# Patient Record
Sex: Female | Born: 1950
Health system: Southern US, Community
[De-identification: ages and names within clinical notes are randomized; demographics above are authoritative.]

## PROBLEM LIST (undated history)

## (undated) DIAGNOSIS — E611 Iron deficiency: Secondary | ICD-10-CM

## (undated) DIAGNOSIS — H33313 Horseshoe tear of retina without detachment, bilateral: Secondary | ICD-10-CM

## (undated) DIAGNOSIS — I499 Cardiac arrhythmia, unspecified: Secondary | ICD-10-CM

## (undated) DIAGNOSIS — J8410 Pulmonary fibrosis, unspecified: Secondary | ICD-10-CM

## (undated) DIAGNOSIS — H269 Unspecified cataract: Secondary | ICD-10-CM

## (undated) DIAGNOSIS — E785 Hyperlipidemia, unspecified: Secondary | ICD-10-CM

## (undated) DIAGNOSIS — R319 Hematuria, unspecified: Secondary | ICD-10-CM

## (undated) DIAGNOSIS — L988 Other specified disorders of the skin and subcutaneous tissue: Secondary | ICD-10-CM

## (undated) DIAGNOSIS — I509 Heart failure, unspecified: Secondary | ICD-10-CM

## (undated) DIAGNOSIS — K2970 Gastritis, unspecified, without bleeding: Secondary | ICD-10-CM

## (undated) DIAGNOSIS — I341 Nonrheumatic mitral (valve) prolapse: Secondary | ICD-10-CM

## (undated) DIAGNOSIS — Z9889 Other specified postprocedural states: Secondary | ICD-10-CM

## (undated) DIAGNOSIS — K649 Unspecified hemorrhoids: Secondary | ICD-10-CM

## (undated) DIAGNOSIS — J309 Allergic rhinitis, unspecified: Secondary | ICD-10-CM

## (undated) DIAGNOSIS — I471 Supraventricular tachycardia, unspecified: Secondary | ICD-10-CM

## (undated) DIAGNOSIS — G4700 Insomnia, unspecified: Secondary | ICD-10-CM

## (undated) DIAGNOSIS — E559 Vitamin D deficiency, unspecified: Secondary | ICD-10-CM

## (undated) DIAGNOSIS — K219 Gastro-esophageal reflux disease without esophagitis: Secondary | ICD-10-CM

## (undated) DIAGNOSIS — F419 Anxiety disorder, unspecified: Secondary | ICD-10-CM

## (undated) DIAGNOSIS — N94819 Vulvodynia, unspecified: Secondary | ICD-10-CM

## (undated) DIAGNOSIS — D369 Benign neoplasm, unspecified site: Secondary | ICD-10-CM

## (undated) DIAGNOSIS — R51 Headache: Secondary | ICD-10-CM

## (undated) DIAGNOSIS — R519 Headache, unspecified: Secondary | ICD-10-CM

## (undated) DIAGNOSIS — J984 Other disorders of lung: Secondary | ICD-10-CM

## (undated) DIAGNOSIS — G43909 Migraine, unspecified, not intractable, without status migrainosus: Secondary | ICD-10-CM

## (undated) DIAGNOSIS — E039 Hypothyroidism, unspecified: Secondary | ICD-10-CM

## (undated) DIAGNOSIS — I10 Essential (primary) hypertension: Secondary | ICD-10-CM

## (undated) DIAGNOSIS — R159 Full incontinence of feces: Secondary | ICD-10-CM

## (undated) DIAGNOSIS — Z8679 Personal history of other diseases of the circulatory system: Secondary | ICD-10-CM

## (undated) HISTORY — DX: Cardiac arrhythmia, unspecified: I49.9

## (undated) HISTORY — DX: Gastritis, unspecified, without bleeding: K29.70

## (undated) HISTORY — DX: Supraventricular tachycardia: I47.1

## (undated) HISTORY — DX: Headache: R51

## (undated) HISTORY — DX: Insomnia, unspecified: G47.00

## (undated) HISTORY — DX: Horseshoe tear of retina without detachment, bilateral: H33.313

## (undated) HISTORY — DX: Vitamin D deficiency, unspecified: E55.9

## (undated) HISTORY — DX: Supraventricular tachycardia, unspecified: I47.10

## (undated) HISTORY — DX: Full incontinence of feces: R15.9

## (undated) HISTORY — DX: Vulvodynia, unspecified: N94.819

## (undated) HISTORY — DX: Iron deficiency: E61.1

## (undated) HISTORY — DX: Unspecified hemorrhoids: K64.9

## (undated) HISTORY — DX: Allergic rhinitis, unspecified: J30.9

## (undated) HISTORY — DX: Essential (primary) hypertension: I10

## (undated) HISTORY — DX: Benign neoplasm, unspecified site: D36.9

## (undated) HISTORY — DX: Heart failure, unspecified: I50.9

## (undated) HISTORY — DX: Other disorders of lung: J98.4

## (undated) HISTORY — DX: Other specified postprocedural states: Z98.890

## (undated) HISTORY — DX: Headache, unspecified: R51.9

## (undated) HISTORY — DX: Other specified disorders of the skin and subcutaneous tissue: L98.8

## (undated) HISTORY — DX: Pulmonary fibrosis, unspecified: J84.10

## (undated) HISTORY — DX: Anxiety disorder, unspecified: F41.9

## (undated) HISTORY — DX: Unspecified cataract: H26.9

## (undated) HISTORY — DX: Hematuria, unspecified: R31.9

## (undated) HISTORY — PX: COLONOSCOPY: SHX174

## (undated) HISTORY — DX: Nonrheumatic mitral (valve) prolapse: I34.1

## (undated) HISTORY — DX: Personal history of other diseases of the circulatory system: Z86.79

## (undated) HISTORY — DX: Hypothyroidism, unspecified: E03.9

## (undated) HISTORY — PX: OTHER SURGICAL HISTORY: SHX169

## (undated) HISTORY — DX: Migraine, unspecified, not intractable, without status migrainosus: G43.909

## (undated) HISTORY — PX: SEPTOPLASTY: SUR1290

## (undated) HISTORY — DX: Gastro-esophageal reflux disease without esophagitis: K21.9

## (undated) HISTORY — PX: RETINAL DETACHMENT SURGERY: SHX105

---

## 1978-10-17 HISTORY — PX: RHINOPLASTY: SUR1284

## 1998-10-17 HISTORY — PX: BREAST LUMPECTOMY: SHX2

## 2005-04-08 ENCOUNTER — Ambulatory Visit (HOSPITAL_BASED_OUTPATIENT_CLINIC_OR_DEPARTMENT_OTHER): Admission: RE | Admit: 2005-04-08 | Discharge: 2005-04-08 | Payer: Self-pay

## 2005-04-17 ENCOUNTER — Ambulatory Visit: Payer: Self-pay | Admitting: Internal Medicine

## 2010-12-29 ENCOUNTER — Emergency Department (HOSPITAL_COMMUNITY)
Admission: EM | Admit: 2010-12-29 | Discharge: 2010-12-29 | Disposition: A | Discharge status: Home / Self Care | Payer: BC Managed Care – PPO | Attending: Emergency Medicine | Admitting: Emergency Medicine

## 2010-12-29 ENCOUNTER — Encounter: Payer: Self-pay | Admitting: Cardiology

## 2010-12-29 DIAGNOSIS — R002 Palpitations: Secondary | ICD-10-CM | POA: Insufficient documentation

## 2010-12-29 DIAGNOSIS — E039 Hypothyroidism, unspecified: Secondary | ICD-10-CM | POA: Insufficient documentation

## 2010-12-29 LAB — DIFFERENTIAL
Basophils Absolute: 0 10*3/uL (ref 0.0–0.1)
Basophils Relative: 1 % (ref 0–1)
Eosinophils Relative: 2 % (ref 0–5)
Lymphocytes Relative: 49 % — ABNORMAL HIGH (ref 12–46)
Monocytes Absolute: 0.3 10*3/uL (ref 0.1–1.0)

## 2010-12-29 LAB — CBC
HCT: 35.2 % — ABNORMAL LOW (ref 36.0–46.0)
MCH: 29 pg (ref 26.0–34.0)
MCHC: 33.5 g/dL (ref 30.0–36.0)
RDW: 12.9 % (ref 11.5–15.5)

## 2010-12-29 LAB — POCT I-STAT, CHEM 8
Creatinine, Ser: 0.8 mg/dL (ref 0.4–1.2)
Glucose, Bld: 95 mg/dL (ref 70–99)
Hemoglobin: 11.9 g/dL — ABNORMAL LOW (ref 12.0–15.0)
TCO2: 24 mmol/L (ref 0–100)

## 2010-12-30 ENCOUNTER — Ambulatory Visit (INDEPENDENT_AMBULATORY_CARE_PROVIDER_SITE_OTHER): Payer: BC Managed Care – PPO | Admitting: Cardiology

## 2010-12-30 ENCOUNTER — Encounter: Payer: Self-pay | Admitting: Cardiology

## 2010-12-30 ENCOUNTER — Telehealth (INDEPENDENT_AMBULATORY_CARE_PROVIDER_SITE_OTHER): Payer: Self-pay | Admitting: *Deleted

## 2010-12-30 ENCOUNTER — Encounter (INDEPENDENT_AMBULATORY_CARE_PROVIDER_SITE_OTHER): Payer: BC Managed Care – PPO

## 2010-12-30 ENCOUNTER — Other Ambulatory Visit: Payer: Self-pay | Admitting: Cardiology

## 2010-12-30 DIAGNOSIS — R002 Palpitations: Secondary | ICD-10-CM | POA: Insufficient documentation

## 2010-12-30 DIAGNOSIS — I059 Rheumatic mitral valve disease, unspecified: Secondary | ICD-10-CM | POA: Insufficient documentation

## 2010-12-30 DIAGNOSIS — E783 Hyperchylomicronemia: Secondary | ICD-10-CM | POA: Insufficient documentation

## 2010-12-30 DIAGNOSIS — E785 Hyperlipidemia, unspecified: Secondary | ICD-10-CM | POA: Insufficient documentation

## 2010-12-30 LAB — MAGNESIUM: Magnesium: 1.9 mg/dL (ref 1.5–2.5)

## 2011-01-04 ENCOUNTER — Encounter: Payer: Self-pay | Admitting: Cardiology

## 2011-01-04 NOTE — Progress Notes (Addendum)
  ROI faxed to [facility] @ [phone] [facility]  December 30, 2010 2:48 PM     Appended Document:  Records Received from [facility] gave to [place]

## 2011-01-04 NOTE — Assessment & Plan Note (Signed)
Summary: np6/ palp/per Bjorn Loser PA-mb   Primary Provider:  Dr. Rubye Oaks Lissa Hoard)  CC:  referal from from Dr. Bernette Mayers for pvcs.  History of Present Illness: 60 yo with strong family history of premature CAD presents for evaluation of palpitations.  Patient has had periodic palpitations since the early 2000s.  She has had severe episodes of palpitations for the last month.  She feels her heart flutter and "beat hard,"  especially at night.  She had a bad episode at [facility] on 3/13 with severe palpitations lasting for about 3 hours.  At one point, her heart was racing as high as the 150s.  The next day (3/14) she went to urgent care for the palpitations and was found to have atrial bigeminy on ECG.  Today, ECG is normal.  She is not having palpitations today.  No syncope or lightheadedness.   Patient also asks about Crestor.  Her LDL was 235 in 12/11 and her PCP asked her to start Crestor but she is reluctant.    Patient has a strong family history of premature CAD.  She denies chest pain or exertional dyspnea.  She takes ASA 81 mg daily.  Coronary calcium score was done in 12/11.  She thinks that the score was 0.  She had a stress test 4-5 years ago in [place] that was normal.    ECG from urgent care 3/14: NSR with PACs in a bigeminal pattern ECG today: NSR, normal  Labs (12/11): LDL 235, HDL 60, TGs 108 Labs (3/12): K 3.7, creatinine 0.8, HCT 35.2  Current Medications (verified): 1)  Aspirin 81 Mg Tbec (Aspirin) .... Take One Tablet By Mouth Daily 2)  Synthroid 125 Mcg Tabs (Levothyroxine Sodium) .Marland Kitchen.. 1  Tab By Mouth Once Daily 3)  Ambien Cr 6.25 Mg Cr-Tabs (Zolpidem Tartrate) .Marland Kitchen.. 1 Tab By Mouth At Bedtime 4)  Lexapro 10 Mg Tabs (Escitalopram Oxalate) .Marland Kitchen.. 1  Tab By Mouth Two Times A Day  Past History:  Past Medical History: 1. Hypothyroidism 2. Anxiety 3. Palpitations 4. CAD workups: Stress test 4-5 years ago was normal.  Coronary calcium score (12/11) 0 per patient.  5. Normal sleep study x  2 6. ? Mitral valve prolapse  Family History: Sister with MI at 22, father with MI at 32, mother with MI 80  Social History: Lives in  [place] in the winter, [place] in the summer.  Retired.  Nonsmoker.   Review of Systems       All systems reviewed and negative except as per HPI.   Vital Signs:  Patient profile:   60 year old female Height:      64 inches Weight:      138 pounds BMI:     23.77 Pulse rate:   84 / minute Resp:     14 per minute BP sitting:   122 / 80  (left arm)  Vitals Entered By: Kem Parkinson (December 30, 2010 11:15 AM)  Physical Exam  General:  Well developed, well nourished, in no acute distress. Head:  normocephalic and atraumatic Nose:  no deformity, discharge, inflammation, or lesions Mouth:  Teeth, gums and palate normal. Oral mucosa normal. Neck:  Neck supple, no JVD. No masses, thyromegaly or abnormal cervical nodes. Lungs:  Clear bilaterally to auscultation and percussion. Heart:  Non-displaced PMI, chest non-tender; regular rate and rhythm, S1, S2 without murmurs, rubs or gallops. Carotid upstroke normal, no bruit.  Pedals normal pulses. No edema, no varicosities. Abdomen:  Bowel sounds positive; abdomen soft and  non-tender without masses, organomegaly, or hernias noted. No hepatosplenomegaly. Extremities:  No clubbing or cyanosis. Neurologic:  Alert and oriented x 3. Skin:  Intact without lesions or rashes. Psych:  Normal affect.   Impression & Recommendations:  Problem # 1:  PALPITATIONS (ICD-785.1) Patient has had increased palpitations for the last month, especially yesterday and the day before.   She had atrial bigeminy on an ECG yesterday.  It is possible that her symptoms are explained solely by PACs.  I do worry, given the episode of HR to the 150s, that she could be having runs of atrial fibrillation or flutter.  I will arrange for a 3 week event monitor to assess for significant arrhythmias.  I will also get an echo to make  sure her heart is structurally normal.  She should avoid caffeine and get adequate sleep and exercise.  I will give her a prescription for metoprolol 12.5 mg two times a day to be used as needed for severe palpitations.  I will also check TSH today.   Problem # 2:  HYPERLIPIDEMIA-MIXED (ICD-272.4) Very high LDL.  Given her strong family history of CAD, I think that she should take a statin.  She was given a prescription for Crestor 10 mg daily by her PCP.  She should take this.  She is concerned about myalgias with the statin.  I will have her also take coenzyme Q10 200 mg daily.   Other Orders: Event (Event) Echocardiogram (Echo) TLB-TSH (Thyroid Stimulating Hormone) (84443-TSH) TLB-Magnesium (Mg) (83735-MG)  Patient Instructions: 1)  Your physician has recommended you make the following change in your medication:  2)  Start Crestor 10mg  daily. 3)  Take Coenzyme Q10 200mg  daily. 4)  Use metoprolol tartrate 12.5mg  twice a day every 12 hours as needed for palpitations. This will be one-half of a 25mg  tablet every 12 hours as needed for palpitations. 5)  Lab today ---Magnesium/TSH 785.1  424.0 6)  Your physician has requested that you have an echocardiogram.  Echocardiography is a painless test that uses sound waves to create images of your heart. It provides your doctor with information about the size and shape of your heart and how well your heart's chambers and valves are working.  This procedure takes approximately one hour. There are no restrictions for this procedure. 7)  Your physician has recommended that you wear an event monitor.  Event monitors are medical devices that record the heart's electrical activity. Doctors most often use these monitors to diagnose arrhythmias. Arrhythmias are problems with the speed or rhythm of the heartbeat. The monitor is a small, portable device. You can wear one while you do your normal daily activities. This is usually used to diagnose what is causing  palpitations/syncope (passing out). 3 WEEK EVENT MONITOR 8)  Your physician recommends that you schedule a follow-up appointment in: 4-5 weeks with Dr Shirlee Latch. Prescriptions: METOPROLOL TARTRATE 25 MG TABS (METOPROLOL TARTRATE) one- half tablet every 12 hours as needed for palpitations  #15 x 6   Entered by:   Katina Dung, RN, BSN   Authorized by:   Marca Ancona, MD   Signed by:   Katina Dung, RN, BSN on 12/30/2010   Method used:   Electronically to        [facility]. [address] [phone]* (retail)       [address]       [place], [place]  [address]  Ph: [phone] or [phone]       Fax: [phone]   RxID:   [id number] CRESTOR 10 MG TABS (ROSUVASTATIN CALCIUM) one daily  #30 x 3   Entered by:   Katina Dung, RN, BSN   Authorized by:   Marca Ancona, MD   Signed by:   Katina Dung, RN, BSN on 12/30/2010   Method used:   Electronically to        [facility]. [address] [phone]* (retail)       [address]       [place], [place]  [address]       Ph: [phone] or [phone]       Fax: [phone]   RxID:   [id number]

## 2011-01-07 ENCOUNTER — Other Ambulatory Visit (HOSPITAL_COMMUNITY): Payer: Self-pay | Admitting: Cardiology

## 2011-01-07 DIAGNOSIS — R002 Palpitations: Secondary | ICD-10-CM

## 2011-01-10 ENCOUNTER — Ambulatory Visit (HOSPITAL_COMMUNITY): Payer: BC Managed Care – PPO | Attending: Cardiology

## 2011-01-10 DIAGNOSIS — R002 Palpitations: Secondary | ICD-10-CM | POA: Insufficient documentation

## 2011-01-10 DIAGNOSIS — E785 Hyperlipidemia, unspecified: Secondary | ICD-10-CM | POA: Insufficient documentation

## 2011-01-11 ENCOUNTER — Telehealth: Payer: Self-pay | Admitting: Cardiology

## 2011-01-11 ENCOUNTER — Encounter: Payer: Self-pay | Admitting: Cardiology

## 2011-01-11 NOTE — Telephone Encounter (Signed)
Walk In Pt Form " Pt Needs Lab Results,And Correction needs to made in System with Medication" sent to Message Nurse 01/11/11/KM

## 2011-01-13 NOTE — Letter (Signed)
Summary: Generic Letter  Architectural technologist, Main Office  [address]   [place], [place] [address]   Phone: [phone]  Fax: [phone]        January 04, 2011 MRN: [id number]    [facility] [address] [place], [place]  [address]    Dear Ms. Desaulniers,  Your lab done 12/30/10 was normal. Please call our office if you have any questions.         Sincerely,  Katina Dung, RN, BSN  This letter has been electronically signed by your physician.

## 2011-01-18 NOTE — Miscellaneous (Signed)
  Clinical Lists Changes  Medications: Removed medication of SYNTHROID 125 MCG TABS (LEVOTHYROXINE SODIUM) 1  tab by mouth once daily Added new medication of SYNTHROID 25 MCG TABS (LEVOTHYROXINE SODIUM) 1 tab every day

## 2011-01-26 ENCOUNTER — Telehealth: Payer: Self-pay | Admitting: *Deleted

## 2011-01-26 NOTE — Telephone Encounter (Signed)
Dr Shirlee Latch reviewed monitor results 12/30/10-- sinus rhythm.--the only transmission on the report was 12/30/10 baseline transmission. I was told by Marcos Eke  this was the end of  summary report, also. LMTCB at [phone] for pt

## 2011-01-27 NOTE — Telephone Encounter (Signed)
I talked with pt by telephone. Enrollment period for monitor 12/30/10-01/19/11. Pt states she did not have any palpitations or problems while she had the monitor.

## 2011-02-12 ENCOUNTER — Encounter: Payer: Self-pay | Admitting: Cardiology

## 2011-02-18 ENCOUNTER — Ambulatory Visit: Payer: BC Managed Care – PPO | Admitting: Cardiology

## 2011-05-06 ENCOUNTER — Encounter: Payer: Self-pay | Admitting: Cardiology

## 2011-11-18 ENCOUNTER — Telehealth: Payer: Self-pay | Admitting: Cardiology

## 2011-11-18 NOTE — Telephone Encounter (Signed)
New problem:  Request sent on 12/13 & 1/10 for permission from Dr. Shirlee Latch - for penial anal   Biofed back.   Please advise Fax [phone].

## 2011-11-23 NOTE — Telephone Encounter (Signed)
Requesting permission for perianal feedback. Reviewed with Dr Shirlee Latch 11/21/11. OK for perianal feedback per Dr Shirlee Latch.

## 2011-12-29 ENCOUNTER — Other Ambulatory Visit: Payer: BC Managed Care – PPO

## 2012-01-18 ENCOUNTER — Encounter: Payer: Self-pay | Admitting: Cardiology

## 2012-01-18 ENCOUNTER — Ambulatory Visit (INDEPENDENT_AMBULATORY_CARE_PROVIDER_SITE_OTHER): Payer: BC Managed Care – PPO | Admitting: *Deleted

## 2012-01-18 DIAGNOSIS — E783 Hyperchylomicronemia: Secondary | ICD-10-CM

## 2012-01-18 DIAGNOSIS — E785 Hyperlipidemia, unspecified: Secondary | ICD-10-CM

## 2012-01-18 LAB — LDL CHOLESTEROL, DIRECT: Direct LDL: 209.4 mg/dL

## 2012-01-18 LAB — BASIC METABOLIC PANEL
BUN: 16 mg/dL (ref 6–23)
CO2: 25 mEq/L (ref 19–32)
Calcium: 9.1 mg/dL (ref 8.4–10.5)
Creatinine, Ser: 0.8 mg/dL (ref 0.4–1.2)

## 2012-01-18 LAB — LIPID PANEL: Triglycerides: 127 mg/dL (ref 0.0–149.0)

## 2012-01-18 NOTE — Telephone Encounter (Signed)
error 

## 2012-01-27 ENCOUNTER — Ambulatory Visit: Payer: BC Managed Care – PPO | Admitting: Cardiology

## 2012-01-27 ENCOUNTER — Ambulatory Visit (INDEPENDENT_AMBULATORY_CARE_PROVIDER_SITE_OTHER): Payer: BC Managed Care – PPO | Admitting: Internal Medicine

## 2012-01-27 ENCOUNTER — Encounter: Payer: Self-pay | Admitting: Internal Medicine

## 2012-01-27 VITALS — BP 107/73 | HR 74 | Ht 65.0 in | Wt 148.0 lb

## 2012-01-27 DIAGNOSIS — R002 Palpitations: Secondary | ICD-10-CM

## 2012-01-27 DIAGNOSIS — E785 Hyperlipidemia, unspecified: Secondary | ICD-10-CM

## 2012-01-27 MED ORDER — ROSUVASTATIN CALCIUM 10 MG PO TABS: 10.0000 mg | ORAL_TABLET | Freq: Every day | ORAL | Status: DC

## 2012-01-27 MED ORDER — METOPROLOL TARTRATE 25 MG PO TABS: ORAL_TABLET | ORAL | Status: DC

## 2012-01-27 NOTE — Patient Instructions (Signed)
Follow up with Dr. Shirlee Latch as previously scheduled.  Your physician recommends that you continue on your current medications as directed. Please refer to the Current Medication list given to you today.

## 2012-01-27 NOTE — Assessment & Plan Note (Signed)
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 She could have an alternative SVT. She has been documented only to have PACs. We discussed extensively the physiology of supraventricular tachycardias and treatment strategies including when necessary AV nodal blocking drugs, daily drugs or catheter ablation. We talked about the importance of documentation of tachycardia prior to pursuing a curative procedure. She is disinclined at this time to undertake a event recorder.  I've advised her also that she could go to her local fire station for documentation.

## 2012-01-27 NOTE — Progress Notes (Signed)
CARDIOLOGY CONSULT NOTE  Patient ID: [name], MRN: [id number], DOB/AGE: August 14, 1951 61 y.o. Admit date: (Not on file) Date of Consult: 01/27/2012  Primary Physician: No primary provider on file. Primary Cardiologist: DM Chief Complaint:     HPI [name] is a 61 y.o. female  she had her own insistence today. She is a patient of DM who was out today.  She has a history of recurrent tachypalpitations which are abrupt in onset and offset occurring increasingly frequently over the last 4 or 5 years with 4 episodes in the last week. One awakened her at night last night. The last 30 minutes to an hour. They are FROG negative but diuretic positive. They're associated with lightheadedness but no chest pain or shortness of breath. She thinks they're provoked by caffeine.  She also is a very strong family history of coronary disease with death and her 17 Something old sister and her father.: She had an LDL recorded a couple years ago 235. She was taking and has recently discontinued her statin therapy because of multiple questions that she has including fibrinogen, gender specificity, lipoprotein profiles, LP (a) HDL and triglycerides etc. etc. etc.    She was originally seen because of palpitations at which time an ECG demonstrated PACs in a pattern of bigeminy.  With her palpitations at home the blood pressure recorder has demonstrated heart rates of 150 or so.  Past Medical History  Diagnosis Date  . Hypothyroidism   . Anxiety   . Palpitations   . Mitral valve prolapse     ?      Surgical History: No past surgical history on file.   Home Meds: Prior to Admission medications   Medication Sig Start Date End Date Taking? Authorizing Provider  aspirin 81 MG EC tablet Take 81 mg by mouth daily.     Yes Historical Provider, MD  Coenzyme Q-10 100 MG capsule Take 100 mg by mouth 2 (two) times daily.     Yes Historical Provider, MD  escitalopram (LEXAPRO) 10 MG tablet Take 10 mg by  mouth daily.    Yes Historical Provider, MD  levothyroxine (SYNTHROID) 25 MCG tablet Take 25 mcg by mouth daily.     Yes Historical Provider, MD  metoprolol tartrate (LOPRESSOR) 25 MG tablet 1/2 tablet every 12 hours as needed for palpitations    Yes Historical Provider, MD  zolpidem (AMBIEN CR) 6.25 MG CR tablet Take 6.25 mg by mouth at bedtime as needed.     Yes Historical Provider, MD      Allergies: Allergies no known allergies  History   Social History  . Marital Status: Married    Spouse Name: N/A    Number of Children: N/A  . Years of Education: N/A   Occupational History  . Retired    Social History Main Topics  . Smoking status: Never Smoker   . Smokeless tobacco: Not on file  . Alcohol Use: Not on file  . Drug Use: Not on file  . Sexually Active: Not on file   Other Topics Concern  . Not on file   Social History Narrative  . No narrative on file     Family History  Problem Relation Age of Onset  . Heart attack Sister 39  . Heart attack Father 42  . Heart attack Mother 50     ROS:  Please see the history of present illness }  All other systems reviewed and negative.    Physical  Exam:  Blood pressure 107/73, pulse 74, height 5\' 5"  (1.651 m), weight 148 lb (67.132 kg). General: Well developed, well nourished female in no acute distress. Head: Normocephalic, atraumatic, sclera non-icteric, no xanthomas, nares are without discharge. Lymph Nodes:  none Neck: Negative for carotid bruits. JVD not elevated. Lungs: Clear bilaterally to auscultation without wheezes, rales, or rhonchi. Breathing is unlabored. Heart: RRR with S1 S2. No murmurs, rubs, or gallops appreciated. Abdomen: Soft, non-tender, non-distended with normoactive bowel sounds. No hepatomegaly. No rebound/guarding. No obvious abdominal masses. Msk:  Strength and tone appear normal for age. Extremities: No clubbing or cyanosis. No edema.  Distal pedal pulses are 2+ and equal bilaterally. Skin:  Warm and Dry Neuro: Alert and oriented X 3. CN III-XII intact Grossly normal sensory and motor function . Psych:  Responds to questions appropriately with a normal affect.      Labs: Cardiac Enzymes No results found for this basename: CKTOTAL:4,CKMB:4,TROPONINI:4 in the last 72 hours CBC Lab Results  Component Value Date   WBC 4.1 12/29/2010   HGB 11.9* 12/29/2010   HCT 35.0* 12/29/2010   MCV 86.5 12/29/2010   PLT 208 12/29/2010   PROTIME: No results found for this basename: LABPROT:3,INR:3 in the last 72 hours Chemistry No results found for this basename: NA,K,CL,CO2,BUN,CREATININE,CALCIUM,LABALBU,PROT,BILITOT,ALKPHOS,ALT,AST,GLUCOSE in the last 168 hours Lipids Lab Results  Component Value Date   CHOL 290* 01/18/2012   HDL 54.40 01/18/2012   TRIG 132.4 01/18/2012   BNP No results found for this basename: probnp   Miscellaneous No results found for this basename: DDIMER    Radiology/Studies:  No results found.  EKG:    Assessment and Plan:  [name]

## 2012-01-27 NOTE — Assessment & Plan Note (Signed)
She has a dyslipidemia. She is recently stopped taking her statin as noted above. I've advised her that I am not sufficiently expert answer all of her questions. She we followed up with Dr. DM  a couple of weeks. I have suggested to her that it is exceedingly important for her long-term care to establish trusting relationship with somebody who can advise her well on her lipidemia issues

## 2012-01-31 ENCOUNTER — Ambulatory Visit: Payer: BC Managed Care – PPO | Admitting: Cardiology

## 2012-02-02 ENCOUNTER — Other Ambulatory Visit: Payer: Self-pay | Admitting: *Deleted

## 2012-02-02 DIAGNOSIS — E785 Hyperlipidemia, unspecified: Secondary | ICD-10-CM

## 2012-02-02 MED ORDER — ROSUVASTATIN CALCIUM 10 MG PO TABS: 10.0000 mg | ORAL_TABLET | Freq: Every day | ORAL | Status: DC

## 2012-02-02 NOTE — Telephone Encounter (Signed)
Refilled crestor 

## 2012-02-06 ENCOUNTER — Other Ambulatory Visit: Payer: Self-pay | Admitting: Internal Medicine

## 2012-02-13 ENCOUNTER — Encounter: Payer: Self-pay | Admitting: Cardiology

## 2012-02-13 ENCOUNTER — Ambulatory Visit (INDEPENDENT_AMBULATORY_CARE_PROVIDER_SITE_OTHER): Payer: BC Managed Care – PPO | Admitting: Cardiology

## 2012-02-13 VITALS — BP 130/82 | HR 80 | Ht 65.0 in | Wt 147.0 lb

## 2012-02-13 DIAGNOSIS — R002 Palpitations: Secondary | ICD-10-CM

## 2012-02-13 DIAGNOSIS — E785 Hyperlipidemia, unspecified: Secondary | ICD-10-CM

## 2012-02-13 DIAGNOSIS — E7800 Pure hypercholesterolemia, unspecified: Secondary | ICD-10-CM

## 2012-02-13 LAB — HIGH SENSITIVITY CRP: CRP, High Sensitivity: 1.05 mg/L (ref 0.000–5.000)

## 2012-02-13 MED ORDER — ROSUVASTATIN CALCIUM 5 MG PO TABS: 5.0000 mg | ORAL_TABLET | Freq: Every day | ORAL | Status: DC

## 2012-02-13 NOTE — Assessment & Plan Note (Signed)
Patient has prolonged episodes of tachycardia with HR up to 160.  This certainly could represent AVNRT runs.  She does not want to wear an event monitor again to document these episodes.  I talked to her about using long-acting metoprolol XL daily.  However, metroprolol tartrate tends to resolve the episodes and she wants to continue using it prn.  If episodes worsen, she will call and I will start Toprol XL and get an event monitor.

## 2012-02-13 NOTE — Progress Notes (Signed)
PCP: Arlyce Harman, [place] [address]  61 yo with strong family history of premature CAD, hyperlipidemia, and palpitations presents for cardiology followup.  She denies chest pain or exertional dyspnea. She takes ASA 81 mg daily. Coronary calcium score was done in 12/11. She thinks that the score was 0. She had a stress test 4-5 years ago in [place] that was normal.    When I last saw her in 3/12, she complained of tachypalpitations.  A 3 week event monitor showed no significant arrhythmias.  She continues to have episodes of tachypalpitations up to 3 times a week.  She takes her heart rate and finds it to be as high as 160 during these episodes.  They can last up to a couple of hours.  She will occasionally gets lightheaded with the palpitations but has not passed out.  She takes prn metoprolol when she has an episode and this will usually resolve it.  She was seen in the office a couple of weeks ago for these symptoms and offered and event monitor.  She does not want to wear an event monitor again.  She has been avoiding caffeine completely.   Patient is also very concerned about statin treatment for her hyperlipidemia.  She is not on a statin currently and has not had side effects from statins in the past but is concerned about dementia, liver damage, etc.    Labs (12/11): LDL 235, HDL 60, TGs 108  Labs (3/12): K 3.7, creatinine 0.8, HCT 35.2  Labs (4/13): LDL 209, HDL 54, K 3.8, creatinine 0.8  Past Medical History:  1. Hypothyroidism  2. Anxiety  3. Palpitations: 3 week event monitor in 3/12 showed no significant arrhythmias.  4. CAD workups: Stress test 5-6 years ago was normal. Coronary calcium score (12/11) 0 per patient.  5. Normal sleep study x 2  6. ? Mitral valve prolapse  7. Hyperlipidemia  Family History:  Sister with MI at 56, father with MI at 61, mother with MI 29   Social History:  Married, lives in [place] in the winter, [place] in the summer. Retired. Nonsmoker.    Review of Systems  All systems reviewed and negative except as per HPI.   Current Outpatient Prescriptions  Medication Sig Dispense Refill  . aspirin 81 MG EC tablet Take 81 mg by mouth daily.        Marland Kitchen escitalopram (LEXAPRO) 10 MG tablet Take 10 mg by mouth daily.       Marland Kitchen levothyroxine (SYNTHROID) 25 MCG tablet Take 25 mcg by mouth daily.        . metoprolol tartrate (LOPRESSOR) 25 MG tablet 1/2 tablet every 12 hours as needed for palpitations  30 tablet  6  . zolpidem (AMBIEN CR) 6.25 MG CR tablet Take 6.25 mg by mouth at bedtime as needed.        . Coenzyme Q10 200 MG TABS Take one daily    0  . rosuvastatin (CRESTOR) 5 MG tablet Take 1 tablet (5 mg total) by mouth at bedtime.  30 tablet  11  . DISCONTD: rosuvastatin (CRESTOR) 10 MG tablet Take 1 tablet (10 mg total) by mouth daily.  30 tablet  6  . DISCONTD: rosuvastatin (CRESTOR) 5 MG tablet Take 1 tablet (5 mg total) by mouth at bedtime.  30 tablet  6    BP 130/82  Pulse 80  Ht 5\' 5"  (1.651 m)  Wt 147 lb (66.679 kg)  BMI 24.46 kg/m2 General: NAD Neck: No JVD,  no thyromegaly or thyroid nodule.  Lungs: Clear to auscultation bilaterally with normal respiratory effort. CV: Nondisplaced PMI.  Heart regular S1/S2, no S3/S4, no murmur.  No peripheral edema.  No carotid bruit.  Normal pedal pulses.  Abdomen: Soft, nontender, no hepatosplenomegaly, no distention.  Neurologic: Alert and oriented x 3.  Psych: Normal affect. Extremities: No clubbing or cyanosis.

## 2012-02-13 NOTE — Patient Instructions (Addendum)
Your physician recommends that you have  lab work today--HS CRP/LPa/lipomed profile  Take coenzyme Q10 200mg  daily.  Take Crestor 5mg  daily.  Your physician wants you to follow-up in: 6 months with Dr Shirlee Latch. (October 2013). You will receive a reminder letter in the mail two months in advance. If you don't receive a letter, please call our office to schedule the follow-up appointment.

## 2012-02-13 NOTE — Assessment & Plan Note (Signed)
Very elevated LDL (209) in the setting of a strong family history of premature CAD.  Given her extreme LDL elevation, I think that she should be on a statin.  She is concerned that women do not benefit as much from statins for primary prevention, but I pointed out to her that her LDL is far to the upper end of the bell curve and that her degree of elevation is very risky. I told her that her risk of dementia from taking a statin was very small.  She has agreed to start Crestor 5 mg daily with lipids/LFTs in 2 months.  I agreed to check a Lipomed profile, hs-CRP, and lipoprotein(a).  She will also start coenzyme Q 10 200 mg daily.

## 2012-02-14 LAB — LIPOPROTEIN A (LPA): Lipoprotein (a): 9 mg/dL (ref 0–30)

## 2012-02-14 LAB — NMR, LIPOPROFILE

## 2012-02-20 ENCOUNTER — Telehealth: Payer: Self-pay | Admitting: Cardiology

## 2012-02-20 NOTE — Telephone Encounter (Signed)
Fu call °Patient returning your call °

## 2012-02-20 NOTE — Telephone Encounter (Signed)
Spoke with pt to let her know that Crestor had been approved 02/14/12-11/09/14 by [employer] OF  [phone].

## 2013-09-06 ENCOUNTER — Encounter: Payer: Self-pay | Admitting: Gastroenterology

## 2013-10-29 ENCOUNTER — Encounter: Payer: Self-pay | Admitting: Gastroenterology

## 2013-10-29 ENCOUNTER — Ambulatory Visit (AMBULATORY_SURGERY_CENTER): Payer: Self-pay

## 2013-10-29 VITALS — Ht 65.0 in | Wt 155.8 lb

## 2013-10-29 DIAGNOSIS — Z800 Family history of malignant neoplasm of digestive organs: Secondary | ICD-10-CM

## 2013-10-29 DIAGNOSIS — Z86010 Personal history of colonic polyps: Secondary | ICD-10-CM

## 2013-10-29 MED ORDER — MOVIPREP 100 G PO SOLR: ORAL | Status: DC

## 2013-10-29 NOTE — Progress Notes (Signed)
Pt came into the office for her pre-visit prior to her colonoscopy with Dr Ardis Hughs on 11/15/13. Pt states she had a colonoscopy done over 5 years ago with Dr Rose Fillers in [place], [place]. A medical release form was filled out and given to Patti(CMA).

## 2013-11-05 ENCOUNTER — Telehealth: Payer: Self-pay | Admitting: Gastroenterology

## 2013-11-05 NOTE — Telephone Encounter (Signed)
Left message on machine to call back  

## 2013-11-05 NOTE — Telephone Encounter (Signed)
Colonoscopy 09/2006 Dr. Rose Fillers, [facility], done for screening; found 2 polyps (seems that one was >1cm however the report is handwritten and not perfectly legible), also diffuse diverticulosis; pathology TA with focal HGD Colonoscopy 08/2007, same Dr, done for polyp follow up: found 1cm sessile sigmoid polyp, pathology TA.   Patty, Please call her.  We got all the records from Dr. Rose Fillers.  Should continue with plan for colonoscopy (looks like it is scheduled for march now, she cancelled a January [facility] appt)

## 2013-11-06 ENCOUNTER — Emergency Department (HOSPITAL_COMMUNITY): Payer: BC Managed Care – PPO

## 2013-11-06 ENCOUNTER — Encounter (HOSPITAL_COMMUNITY): Payer: Self-pay | Admitting: Emergency Medicine

## 2013-11-06 ENCOUNTER — Emergency Department (HOSPITAL_COMMUNITY)
Admission: EM | Admit: 2013-11-06 | Discharge: 2013-11-06 | Disposition: A | Discharge status: Home / Self Care | Payer: BC Managed Care – PPO | Attending: Emergency Medicine | Admitting: Emergency Medicine

## 2013-11-06 DIAGNOSIS — F411 Generalized anxiety disorder: Secondary | ICD-10-CM | POA: Insufficient documentation

## 2013-11-06 DIAGNOSIS — I471 Supraventricular tachycardia: Secondary | ICD-10-CM

## 2013-11-06 DIAGNOSIS — I4719 Other supraventricular tachycardia: Secondary | ICD-10-CM

## 2013-11-06 DIAGNOSIS — Z79899 Other long term (current) drug therapy: Secondary | ICD-10-CM | POA: Insufficient documentation

## 2013-11-06 DIAGNOSIS — I498 Other specified cardiac arrhythmias: Secondary | ICD-10-CM

## 2013-11-06 DIAGNOSIS — E039 Hypothyroidism, unspecified: Secondary | ICD-10-CM | POA: Insufficient documentation

## 2013-11-06 DIAGNOSIS — R000 Tachycardia, unspecified: Secondary | ICD-10-CM | POA: Insufficient documentation

## 2013-11-06 DIAGNOSIS — Z7982 Long term (current) use of aspirin: Secondary | ICD-10-CM | POA: Insufficient documentation

## 2013-11-06 DIAGNOSIS — Z8679 Personal history of other diseases of the circulatory system: Secondary | ICD-10-CM | POA: Insufficient documentation

## 2013-11-06 HISTORY — DX: Supraventricular tachycardia: I47.1

## 2013-11-06 HISTORY — DX: Other supraventricular tachycardia: I47.19

## 2013-11-06 LAB — CBC WITH DIFFERENTIAL/PLATELET
Basophils Absolute: 0 10*3/uL (ref 0.0–0.1)
Basophils Relative: 0 % (ref 0–1)
Eosinophils Absolute: 0.1 10*3/uL (ref 0.0–0.7)
Eosinophils Relative: 1 % (ref 0–5)
HCT: 36.6 % (ref 36.0–46.0)
HEMOGLOBIN: 12.4 g/dL (ref 12.0–15.0)
Lymphocytes Relative: 36 % (ref 12–46)
Lymphs Abs: 2.7 10*3/uL (ref 0.7–4.0)
MCH: 29.6 pg (ref 26.0–34.0)
MCHC: 33.9 g/dL (ref 30.0–36.0)
MCV: 87.4 fL (ref 78.0–100.0)
MONOS PCT: 7 % (ref 3–12)
Monocytes Absolute: 0.5 10*3/uL (ref 0.1–1.0)
Neutro Abs: 4.2 10*3/uL (ref 1.7–7.7)
Neutrophils Relative %: 57 % (ref 43–77)
Platelets: 230 10*3/uL (ref 150–400)
RBC: 4.19 MIL/uL (ref 3.87–5.11)
RDW: 13.8 % (ref 11.5–15.5)
WBC: 7.5 10*3/uL (ref 4.0–10.5)

## 2013-11-06 LAB — URINALYSIS, ROUTINE W REFLEX MICROSCOPIC
Bilirubin Urine: NEGATIVE
Glucose, UA: NEGATIVE mg/dL
Hgb urine dipstick: NEGATIVE
KETONES UR: NEGATIVE mg/dL
LEUKOCYTES UA: NEGATIVE
NITRITE: NEGATIVE
PH: 7 (ref 5.0–8.0)
Protein, ur: NEGATIVE mg/dL
SPECIFIC GRAVITY, URINE: 1.006 (ref 1.005–1.030)
Urobilinogen, UA: 0.2 mg/dL (ref 0.0–1.0)

## 2013-11-06 LAB — BASIC METABOLIC PANEL
BUN: 17 mg/dL (ref 6–23)
CHLORIDE: 103 meq/L (ref 96–112)
CO2: 21 mEq/L (ref 19–32)
Calcium: 9 mg/dL (ref 8.4–10.5)
Creatinine, Ser: 0.74 mg/dL (ref 0.50–1.10)
GFR calc non Af Amer: 90 mL/min — ABNORMAL LOW (ref >90)
Glucose, Bld: 101 mg/dL — ABNORMAL HIGH (ref 70–99)
POTASSIUM: 4.3 meq/L (ref 3.7–5.3)
SODIUM: 139 meq/L (ref 137–147)

## 2013-11-06 LAB — POCT I-STAT TROPONIN I: Troponin i, poc: 0 ng/mL (ref 0.00–0.08)

## 2013-11-06 MED ORDER — METOPROLOL TARTRATE 1 MG/ML IV SOLN
5.0000 mg | INTRAVENOUS | Status: DC | PRN
  Filled 2013-11-06: qty 5

## 2013-11-06 MED ORDER — SODIUM CHLORIDE 0.9 % IV BOLUS (SEPSIS)
1000.0000 mL | Freq: Once | INTRAVENOUS | Status: AC
  Administered 2013-11-06: 1000 mL via INTRAVENOUS

## 2013-11-06 MED ORDER — ADENOSINE 6 MG/2ML IV SOLN
INTRAVENOUS | Status: AC
  Administered 2013-11-06: 6 mg via INTRAVENOUS
  Filled 2013-11-06: qty 4

## 2013-11-06 MED ORDER — ADENOSINE 6 MG/2ML IV SOLN
6.0000 mg | Freq: Once | INTRAVENOUS | Status: AC
  Administered 2013-11-06: 6 mg via INTRAVENOUS

## 2013-11-06 MED ORDER — LORAZEPAM 1 MG PO TABS
0.5000 mg | ORAL_TABLET | Freq: Once | ORAL | Status: AC
  Administered 2013-11-06: 0.5 mg via ORAL
  Filled 2013-11-06: qty 1

## 2013-11-06 MED ORDER — METOPROLOL TARTRATE 5 MG/5ML IV SOLN: 5.0000 mg | INTRAVENOUS | Status: DC | PRN

## 2013-11-06 NOTE — ED Notes (Signed)
Pt ambulatory to discharge. IN NAD. With instructions to follow up with Dr. Jens Som.

## 2013-11-06 NOTE — H&P (Signed)
Patient ID: OTIS TERNES MRN: [id number], DOB/AGE: 1951/03/21   Admit date: 11/06/2013   Primary Physician: Tommy Medal, MD Primary Cardiologist: Dr. Allegra Lai  Pt. Profile:  Ms. [name] is a 63 yo female with a history of hyperlipidemia, palpitations, hypothyroidism, anxiety and no past cardiac history who presents to the ED with a chief complaint of a racing heart. She has a long history of recurrent tachypalpitations which are abrupt in onset and offset occurring frequently over the last 4 or 5 years. She has seen Dr. Caryl Comes who suspected that she had AV node reentry or an alternative SVT.  She wore a 3 week event monitor in 3/12 which showed no significant arrhythmias. She refused to undertake another event recorder at that time and  was given an AV-nodal blocker for her symptoms.  Lately she has been having about 2 episodes of palpitations a month. It happens more often during the morning hours when she wakes up from sleep. She does not have problems while she exercises and remains pretty active. No relationship with certain foods or related to meals. She has completely eliminated caffeine from her diet. Positions like bending over seem to aggravate it. Valsalva maneuvers have not worked.  She had been doing well on the metoprolol but recently switched to propranolol because the metop made her feel "washed out." This worked well for two episodes, but today did not break the tachycardia.  She awoke this morning at 8am with a heart rate of 171, took 1 propranolol which brought her HR to 122. She then took 1/2 tablet more which brought it down to 110. She then took one more 1/2 tablet and, it then slowly began to rise up to 120s- where it remains. No n/v. No othropnea, PND, or swelling. She denies chest pain or SOB; however, she does get lightheaded and feels extremely fatigued.  She has a strong family history of CAD. Sister with MI at 73, father with MI at 23, mother with MI 9. She denies  chest pain or exertional dyspnea. She takes ASA 81 mg daily. Coronary calcium score was done in 12/11. She thinks that the score was 0. She had a stress test 4-5 years ago in [place] that was normal. CAD workups: Stress test 5-6 years ago was normal. Coronary calcium score (12/11) 0 per patient, Normal sleep study x 2, Mitral valve prolapse.She is currently on a statin for her dyslipidemia. She is followed by Dr. Aundra Dubin for this.  Problem List  Past Medical History  Diagnosis Date  . Hypothyroidism   . Anxiety   . Palpitations   . Mitral valve prolapse     ?  Marland Kitchen Rectal leakage     1 time  . Rapid or irregular heartbeat     Past Surgical History  Procedure Laterality Date  . Breast lumpectomy  2000    left breast-  . Skin lesions      left leg,right leg  . Colonoscopy    . Nose surgery      1980     Allergies  No Known Allergies  Home Medications  Prior to Admission medications   Medication Sig Start Date End Date Taking? Authorizing Provider  aspirin 81 MG EC tablet Take 81 mg by mouth daily.      Historical Provider, MD  Coenzyme Q10 200 MG TABS Take one daily 02/13/12   Larey Dresser, MD  escitalopram (LEXAPRO) 10 MG tablet Take 10 mg by mouth daily.  Historical Provider, MD  levothyroxine (SYNTHROID) 25 MCG tablet Take 25 mcg by mouth daily.      Historical Provider, MD  metoprolol tartrate (LOPRESSOR) 25 MG tablet 1/2 tablet every 12 hours as needed for palpitations 01/27/12   Deboraha Sprang, MD  MOVIPREP 100 G SOLR Moviprep as directed / no substitutions 10/29/13   Milus Banister, MD  Multiple Vitamin (MULTIVITAMIN) tablet Take 2 tablets by mouth daily.    Historical Provider, MD  pravastatin (PRAVACHOL) 10 MG tablet Take 10 mg by mouth daily.    Historical Provider, MD  Probiotic Product (PROBIOTIC DAILY PO) Take by mouth daily.    Historical Provider, MD  propranolol (INDERAL) 80 MG tablet Take 80 mg by mouth as needed.    Historical Provider, MD  rosuvastatin  (CRESTOR) 5 MG tablet Take 1 tablet (5 mg total) by mouth at bedtime. 02/13/12 02/12/13  Larey Dresser, MD  zolpidem (AMBIEN CR) 6.25 MG CR tablet Take 6.25 mg by mouth at bedtime as needed.      Historical Provider, MD    Family History  Family History  Problem Relation Age of Onset  . Heart attack Sister 33  . Heart disease Sister   . Heart attack Father 71  . Heart attack Mother 60  . Pancreatic cancer Mother   . Colon cancer Paternal Uncle     Social History  History   Social History  . Marital Status: Married    Spouse Name: N/A    Number of Children: N/A  . Years of Education: N/A   Occupational History  . Retired    Social History Main Topics  . Smoking status: Never Smoker   . Smokeless tobacco: Never Used  . Alcohol Use: Yes  . Drug Use: No  . Sexual Activity: Not on file   Other Topics Concern  . Not on file   Social History Narrative  . No narrative on file     All other systems reviewed and are otherwise negative except as noted above.  Physical Exam  Blood pressure 105/70, pulse 127, temperature 98.6 F (37 C), temperature source Oral, resp. rate 18, height 5\' 5"  (1.651 m), weight 155 lb (70.308 kg), SpO2 100.00%.   General: Pleasant, NAD Psych: Normal affect. Neuro: Alert and oriented X 3. Moves all extremities spontaneously. HEENT: Normal  Neck: Supple without bruits or No JVD. Lungs:  Resp regular and unlabored, CTA. Heart: RRR no s3, s4, or murmurs. Abdomen: Soft, non-tender, non-distended, BS + x 4.  Extremities: No clubbing, cyanosis or edema. DP/PT/Radials 2+ and equal bilaterally.  Labs  No results found for this basename: CKTOTAL, CKMB, TROPONINI,  in the last 72 hours Lab Results  Component Value Date   WBC 7.5 11/06/2013   HGB 12.4 11/06/2013   HCT 36.6 11/06/2013   MCV 87.4 11/06/2013   PLT 230 11/06/2013      Radiology/Studies  No results found.  ECG  HR 126, accelerated junctional tachyarrhythmia   ASSESSMENT AND  PLAN  Ms. [name] is a 63 yo female with a history of hyperlipidemia, hypothyroidism, anxiety and no past cardiac history who presented to the ED with a chief complaint of a racing heart. She has a long history of recurrent tachypalpitations which are abrupt in onset and offset occurring frequently over the last 4 or 5 years. She was seen in 01/2011 by Dr. Caryl Comes who suspected that she had AV node reentry or an alternative SVT. She wore a 3 week event  monitor in 3/12 which showed no significant arrhythmias.She refused to undertake another event recorder at that time and was given an AV-nodal blocker for her symptoms. She has recently been experiencing these episodes approx 2x/month. She had an episode this morning which has not broken with propranolol. This is the first time it has lasted this long.   PLAN:   Tyrell Antonio, PA-C 11/06/2013, 3:39 PM  Pager [phone]  Patient seen and examined independently. Lorretta Harp PA-C note reviewed carefully - agree with his assessment and plan. I have edited the note based on my findings.   She has recurrent tachypalpitations - no t the 3rd episode this month. 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 She has seen Dr. Caryl Comes recently who suggested b-blocker and event monitor which she refused. The episodes are now more frequent despite behavioral modifications to avoid. We tried carotid massage and vagal maneuvers in ER without success. After a long discussion we then gave her 6mg  adenosine with prompt conversion to NSR. I recommended she f/u with Dr. Caryl Comes to discuss ablation.   Daniel Bensimhon,MD 5:07 PM

## 2013-11-06 NOTE — Discharge Instructions (Signed)
Return here as needed. Follow up with your Cardiologist °

## 2013-11-06 NOTE — ED Notes (Addendum)
Dr. Haroldine Laws at bedside ordered Adenosine 6mg  IV. Attached pt to zoll monitor. Pt tolerated medication well- HR 70-80's sinus rhythm now. Pt resting comfortably. Pt ready for discharge per cardiology.

## 2013-11-06 NOTE — ED Provider Notes (Signed)
CSN: [id number]     Arrival date & time 11/06/13  1320 History   First MD Initiated Contact with Patient 11/06/13 1359     Chief Complaint  Patient presents with  . rapid heart beat    (Consider location/radiation/quality/duration/timing/severity/associated sxs/prior Treatment) HPI [name] Is a 63 year old female presenting to the emergency department with chief complaint of racing heart.  2 the past medical history of hypothyroidism, tachycardia arrhythmias, tachycardia palpitations.  Previously seen by Dr. Caryl Comes and Dr. Aundra Dubin in cardiology.  The patient states he will do this morning with racing heart.  Patient has been instructed to take propranolol for her symptoms.  Patient states that she took 3 doses of this medication without relief of her symptoms.  The patient also tried napping and vagal maneuvers at home.  She called her PCP who referred her here to the emergency department for continued tachycardia.  Patient states that when she woke this morning her heart rate was 171.  She was able to bring it down into the 120s to 130s with the medication.  Patient also states she used to take metoprolol but was recently switched to propranolol because metoprolol made her very sleepy.  Patient denies any symptoms of infection such as recent URI, myalgias, fevers, urinary symptoms, skin infections.  He denies any recent use of stimulant.  She denies caffeine intake.  She did eat a small amount of chocolate chips last night but states she did not feel this would start her into her tachycardia arrhythmia.  Patient did not take any anticoagulants and is an otherwise healthy female.  She does complain of feeling "washed out."  She denies any chest pain or shortness of breath.  Past Medical History  Diagnosis Date  . Hypothyroidism   . Anxiety   . Palpitations   . Mitral valve prolapse     ?  Marland Kitchen Rectal leakage     1 time  . Rapid or irregular heartbeat    Past Surgical History  Procedure  Laterality Date  . Breast lumpectomy  2000    left breast-  . Skin lesions      left leg,right leg  . Colonoscopy    . Nose surgery      1980   Family History  Problem Relation Age of Onset  . Heart attack Sister 69  . Heart disease Sister   . Heart attack Father 56  . Heart attack Mother 48  . Pancreatic cancer Mother   . Colon cancer Paternal Uncle    History  Substance Use Topics  . Smoking status: Never Smoker   . Smokeless tobacco: Never Used  . Alcohol Use: Yes   OB History   Grav Para Term Preterm Abortions TAB SAB Ect Mult Living                 Review of Systems Ten systems reviewed and are negative for acute change, except as noted in the HPI.   Allergies  Review of patient's allergies indicates no known allergies.  Home Medications   Current Outpatient Rx  Name  Route  Sig  Dispense  Refill  . aspirin 81 MG EC tablet   Oral   Take 81 mg by mouth daily.           . Coenzyme Q10 200 MG TABS      Take one daily      0   . escitalopram (LEXAPRO) 10 MG tablet   Oral   Take  10 mg by mouth daily.          Marland Kitchen levothyroxine (SYNTHROID) 25 MCG tablet   Oral   Take 25 mcg by mouth daily.           . metoprolol tartrate (LOPRESSOR) 25 MG tablet      1/2 tablet every 12 hours as needed for palpitations   30 tablet   6   . MOVIPREP 100 G SOLR      Moviprep as directed / no substitutions   1 kit   0     Dispense as written.   . Multiple Vitamin (MULTIVITAMIN) tablet   Oral   Take 2 tablets by mouth daily.         . pravastatin (PRAVACHOL) 10 MG tablet   Oral   Take 10 mg by mouth daily.         . Probiotic Product (PROBIOTIC DAILY PO)   Oral   Take by mouth daily.         . propranolol (INDERAL) 80 MG tablet   Oral   Take 80 mg by mouth as needed.         Marland Kitchen EXPIRED: rosuvastatin (CRESTOR) 5 MG tablet   Oral   Take 1 tablet (5 mg total) by mouth at bedtime.   30 tablet   11   . zolpidem (AMBIEN CR) 6.25 MG CR  tablet   Oral   Take 6.25 mg by mouth at bedtime as needed.            BP 96/65  Pulse 126  Temp(Src) 98.6 F (37 C) (Oral)  Resp 18  Ht '5\' 5"'  (1.651 m)  Wt 155 lb (70.308 kg)  BMI 25.79 kg/m2  SpO2 100% Physical Exam Physical Exam  Nursing note and vitals reviewed. Constitutional: She is oriented to person, place, and time. She appears well-developed and well-nourished. No distress.  HENT:  Head: Normocephalic and atraumatic.  Eyes: Conjunctivae normal and EOM are normal. Pupils are equal, round, and reactive to light. No scleral icterus.  Neck: Normal range of motion.  Cardiovascular: tachycardic.regular rhythm and normal heart sounds.  Exam reveals no gallop and no friction rub.   No murmur heard. Pulmonary/Chest: Effort normal and breath sounds normal. No respiratory distress.  Abdominal: Soft. Bowel sounds are normal. She exhibits no distension and no mass. There is no tenderness. There is no guarding.  Neurological: She is alert and oriented to person, place, and time.  Skin: Skin is warm and dry. She is not diaphoretic.    ED Course  Procedures (including critical care time) Labs Review Labs Reviewed  BASIC METABOLIC PANEL  URINALYSIS, ROUTINE W REFLEX MICROSCOPIC  CBC WITH DIFFERENTIAL   Imaging Review No results found.  EKG Interpretation    Date/Time:  Wednesday November 06 2013 13:31:26 EST Ventricular Rate:  126 PR Interval:    QRS Duration: 70 QT Interval:  316 QTC Calculation: 457 R Axis:   123 Text Interpretation:  ? atrial flutter vs junctional tachycardia Lateral infarct , age undetermined ST' \T' \ T wave abnormality, consider inferior ischemia Abnormal ECG findings are new compared to EKG dated march 2012 Confirmed by [facility]  MD-J, JON ([id number]) on 11/06/2013 2:28:02 PM            MDM  No diagnosis found. Patient here with tachycardia. Changes to EKG form previous with rapid rate. ? Aflutter vs junctional rhythm and new st/t wave  abnormalities. Patient is seen in shared visit with Dr.  Knapp. Work up has been initiated   Patient pressures soft. I ordered metoprolol however I wanted the pressure to rise so she has gotten one L fluid with elevating pressure.  Labs are unremarkable. Negative troponin and cxr. Awaiting cardiology consult. I have given report to Ismay who will assume care of the patient.    Margarita Mail, PA-C 11/06/13 1757

## 2013-11-06 NOTE — ED Notes (Signed)
Patient stated she woke up this morning with hr of 171, she took medication and brought it down to 120's.  Patient denies any other symptoms, but states that she called her doctor and asked if she needed to further medicate.  He advised because bp was lower, not to redose at that time.   Patient states she has had similar episodes previously.

## 2013-11-08 NOTE — Telephone Encounter (Signed)
Left message on machine to call back letter mailed. 

## 2013-11-12 NOTE — ED Provider Notes (Signed)
Medical screening examination/treatment/procedure(s) were performed by non-physician practitioner and as supervising physician I was immediately available for consultation/collaboration.  EKG Interpretation    Date/Time:  Wednesday November 06 2013 13:31:26 EST Ventricular Rate:  126 PR Interval:    QRS Duration: 70 QT Interval:  316 QTC Calculation: 457 R Axis:   123 Text Interpretation:  ? atrial flutter vs junctional tachycardia Lateral infarct , age undetermined ST \\T \ T wave abnormality, consider inferior ischemia Abnormal ECG findings are new compared to EKG dated march 2012 Confirmed by [facility]  MD-J, Deneka Greenwalt ([id number]) on 11/06/2013 2:28:02 PM            Pt with narrow complex tachycardia.  Rate in the 120s.   Stable in no distress.  Consulted with cardiology.  Kathalene Frames, MD 11/12/13 [phone]

## 2013-11-13 ENCOUNTER — Telehealth: Payer: Self-pay | Admitting: Gastroenterology

## 2013-11-13 ENCOUNTER — Encounter: Payer: Self-pay | Admitting: Internal Medicine

## 2013-11-13 ENCOUNTER — Ambulatory Visit (INDEPENDENT_AMBULATORY_CARE_PROVIDER_SITE_OTHER): Payer: BC Managed Care – PPO | Admitting: Internal Medicine

## 2013-11-13 VITALS — BP 145/78 | HR 73 | Ht 65.0 in | Wt 155.0 lb

## 2013-11-13 DIAGNOSIS — I471 Supraventricular tachycardia: Secondary | ICD-10-CM

## 2013-11-13 DIAGNOSIS — I498 Other specified cardiac arrhythmias: Secondary | ICD-10-CM

## 2013-11-13 DIAGNOSIS — R002 Palpitations: Secondary | ICD-10-CM

## 2013-11-13 DIAGNOSIS — I517 Cardiomegaly: Secondary | ICD-10-CM

## 2013-11-13 DIAGNOSIS — R000 Tachycardia, unspecified: Secondary | ICD-10-CM

## 2013-11-13 NOTE — Progress Notes (Signed)
      Patient Care Team: Richard Pang, MD as PCP - General (Internal Medicine) Dalton S McLean, MD (Cardiology) Scott T Weaver, PA-C as Physician Assistant (Physician Assistant)   HPI  [name] is a 63 y.o. female Seen after a hiatus of a couple years. At that time he was seen for Dr. DM and had palpitations that were suggestive of AV reentry probably AV node reentry.  She had recurrent symptoms earlier this month and was seen in the emergency room. She was given adenosine with termination. It was a short RP tachycardia with a RP interval of approximately 120 ms  She has been having episodes couple times a month. she frequently awaken her at night. she was started on metoprolol and has slept better without recurrences.  Echocardiogram 2012 demonstrated normal left ventricular function left atrial enlargement  Past Medical History  Diagnosis Date  . Hypothyroidism   . Anxiety   . Palpitations   . Mitral valve prolapse     ?  . Rectal leakage     1 time  . Rapid or irregular heartbeat     Past Surgical History  Procedure Laterality Date  . Breast lumpectomy  2000    left breast-  . Skin lesions      left leg,right leg  . Colonoscopy    . Nose surgery      1980    Current Outpatient Prescriptions  Medication Sig Dispense Refill  . aspirin 81 MG EC tablet Take 81 mg by mouth daily.        . escitalopram (LEXAPRO) 10 MG tablet Take 10 mg by mouth daily.       . levothyroxine (SYNTHROID) 25 MCG tablet Take 25 mcg by mouth daily.        . metoprolol succinate (TOPROL-XL) 25 MG 24 hr tablet Take 25 mg by mouth daily. 1/2 tablet daily      . Multiple Vitamin (MULTIVITAMIN) tablet Take 2 tablets by mouth daily.      . pravastatin (PRAVACHOL) 40 MG tablet Take 40 mg by mouth daily.      . Probiotic Product (PROBIOTIC DAILY PO) Take 1 tablet by mouth daily.        No current facility-administered medications for this visit.    No Known Allergies  Review of Systems  negative except from HPI and PMH  Physical Exam BP 145/78  Pulse 73  Ht 5' 5" (1.651 m)  Wt 155 lb (70.308 kg)  BMI 25.79 kg/m2 Well developed and nourished in no acute distress HENT normal Neck supple with JVP-flat Clear Regular rate and rhythm, no murmurs or gallops Abd-soft with active BS No Clubbing cyanosis edema Skin-warm and dry A & Oriented  Grossly normal sensory and motor function  ECG demonstrates normal sinus rhythm without preexcitation  ECG her tachycardia demonstrates a narrow QRS tachycardia short RP with RP interval of 100 ms.   Assessment and  Plan  

## 2013-11-13 NOTE — Telephone Encounter (Signed)
Pt aware she should continue with plans for upcoming colonoscopy

## 2013-11-13 NOTE — Patient Instructions (Addendum)
Your physician recommends that you continue on your current medications as directed. Please refer to the Current Medication list given to you today.  Your physician has requested that you have an echocardiogram. Echocardiography is a painless test that uses sound waves to create images of your heart. It provides your doctor with information about the size and shape of your heart and how well your heart's chambers and valves are working. This procedure takes approximately one hour. There are no restrictions for this procedure.  Please call Kelly/Siah Steely [phone] about decision -- possible ablation 2/5??  Your physician has recommended that you have an SVT ablation with Dr. Lovena Le. Catheter ablation is a medical procedure used to treat some cardiac arrhythmias (irregular heartbeats). During catheter ablation, a long, thin, flexible tube is put into a blood vessel in your groin (upper thigh), or neck. This tube is called an ablation catheter. It is then guided to your heart through the blood vessel. Radio frequency waves destroy small areas of heart tissue where abnormal heartbeats may cause an arrhythmia to start. Please see the instruction sheet given to you today.

## 2013-11-13 NOTE — Assessment & Plan Note (Signed)
The patient has recurrent narrow QRS it is short RP but the RP interval of about 120 ms with a P wave morphology that suggest slow slow AV node reentry. We had a lengthy discussion regarding treatment options including beta blockers catheter ablation for this potential for complications. After more than an hour she decided to proceed with catheter ablation.  She also has a history of left atrial enlargement by echo. We will plan to repeat her echo.

## 2013-11-14 ENCOUNTER — Encounter: Payer: Self-pay | Admitting: *Deleted

## 2013-11-14 ENCOUNTER — Encounter (HOSPITAL_COMMUNITY): Payer: Self-pay | Admitting: Pharmacy Technician

## 2013-11-14 ENCOUNTER — Telehealth: Payer: Self-pay | Admitting: Internal Medicine

## 2013-11-14 NOTE — Telephone Encounter (Signed)
Called pt earlier today and discussed instructions. I also emailed her instructions per her request. Pt verbalized understanding.

## 2013-11-14 NOTE — Telephone Encounter (Signed)
Left message for pt to return call to go over SVT ablation procedure instructions. Procedure scheduled for 2/5 with Dr. Lovena Le at 10:30 am.

## 2013-11-14 NOTE — Telephone Encounter (Signed)
Follow Up ° ° °Pt returning call from earlier. Please call back. °

## 2013-11-14 NOTE — Telephone Encounter (Signed)
New problem   Pt need to speak to nurse b/c she want to go ahead with the procedure. Please call pt

## 2013-11-15 ENCOUNTER — Ambulatory Visit (HOSPITAL_COMMUNITY)
Admission: RE | Admit: 2013-11-15 | Discharge: 2013-11-15 | Disposition: A | Discharge status: Home / Self Care | Payer: BC Managed Care – PPO | Source: Ambulatory Visit | Attending: Internal Medicine | Admitting: Internal Medicine

## 2013-11-15 ENCOUNTER — Other Ambulatory Visit: Payer: BC Managed Care – PPO | Admitting: Gastroenterology

## 2013-11-15 ENCOUNTER — Other Ambulatory Visit: Payer: Self-pay | Admitting: *Deleted

## 2013-11-15 DIAGNOSIS — I517 Cardiomegaly: Secondary | ICD-10-CM | POA: Insufficient documentation

## 2013-11-15 DIAGNOSIS — E785 Hyperlipidemia, unspecified: Secondary | ICD-10-CM | POA: Insufficient documentation

## 2013-11-15 DIAGNOSIS — Z8249 Family history of ischemic heart disease and other diseases of the circulatory system: Secondary | ICD-10-CM | POA: Insufficient documentation

## 2013-11-15 DIAGNOSIS — I059 Rheumatic mitral valve disease, unspecified: Secondary | ICD-10-CM | POA: Insufficient documentation

## 2013-11-15 NOTE — Progress Notes (Signed)
  Echocardiogram 2D Echocardiogram has been performed.  [name] 11/15/2013, 2:37 PM

## 2013-11-21 ENCOUNTER — Encounter (HOSPITAL_COMMUNITY)
Admission: RE | Disposition: A | Discharge status: Home / Self Care | Payer: Self-pay | Source: Ambulatory Visit | Attending: Internal Medicine

## 2013-11-21 ENCOUNTER — Ambulatory Visit (HOSPITAL_COMMUNITY)
Admission: RE | Admit: 2013-11-21 | Discharge: 2013-11-22 | Disposition: A | Discharge status: Home / Self Care | Payer: BC Managed Care – PPO | Source: Ambulatory Visit | Attending: Internal Medicine | Admitting: Internal Medicine

## 2013-11-21 ENCOUNTER — Encounter (HOSPITAL_COMMUNITY): Payer: Self-pay | Admitting: General Practice

## 2013-11-21 DIAGNOSIS — I471 Supraventricular tachycardia, unspecified: Secondary | ICD-10-CM | POA: Diagnosis present

## 2013-11-21 DIAGNOSIS — F411 Generalized anxiety disorder: Secondary | ICD-10-CM | POA: Insufficient documentation

## 2013-11-21 DIAGNOSIS — I498 Other specified cardiac arrhythmias: Secondary | ICD-10-CM | POA: Insufficient documentation

## 2013-11-21 DIAGNOSIS — E039 Hypothyroidism, unspecified: Secondary | ICD-10-CM | POA: Insufficient documentation

## 2013-11-21 DIAGNOSIS — Z7982 Long term (current) use of aspirin: Secondary | ICD-10-CM | POA: Insufficient documentation

## 2013-11-21 HISTORY — PX: ABLATION: SHX5711

## 2013-11-21 HISTORY — DX: Hyperlipidemia, unspecified: E78.5

## 2013-11-21 HISTORY — PX: SUPRAVENTRICULAR TACHYCARDIA ABLATION: SHX5492

## 2013-11-21 LAB — BASIC METABOLIC PANEL
BUN: 13 mg/dL (ref 6–23)
CHLORIDE: 107 meq/L (ref 96–112)
CO2: 23 mEq/L (ref 19–32)
Calcium: 8.9 mg/dL (ref 8.4–10.5)
Creatinine, Ser: 0.71 mg/dL (ref 0.50–1.10)
GFR calc Af Amer: >90 mL/min (ref >90)
GFR calc non Af Amer: >90 mL/min (ref >90)
GLUCOSE: 108 mg/dL — AB (ref 70–99)
Potassium: 3.9 mEq/L (ref 3.7–5.3)
Sodium: 143 mEq/L (ref 137–147)

## 2013-11-21 LAB — CBC
HCT: 35.6 % — ABNORMAL LOW (ref 36.0–46.0)
HEMOGLOBIN: 11.9 g/dL — AB (ref 12.0–15.0)
MCH: 29 pg (ref 26.0–34.0)
MCHC: 33.4 g/dL (ref 30.0–36.0)
MCV: 86.6 fL (ref 78.0–100.0)
Platelets: 221 10*3/uL (ref 150–400)
RBC: 4.11 MIL/uL (ref 3.87–5.11)
RDW: 13.6 % (ref 11.5–15.5)
WBC: 4.8 10*3/uL (ref 4.0–10.5)

## 2013-11-21 SURGERY — SUPRAVENTRICULAR TACHYCARDIA ABLATION
Anesthesia: LOCAL

## 2013-11-21 MED ORDER — BUPIVACAINE HCL (PF) 0.25 % IJ SOLN
INTRAMUSCULAR | Status: AC
  Filled 2013-11-21: qty 30

## 2013-11-21 MED ORDER — SODIUM CHLORIDE 0.9 % IV SOLN
250.0000 mL | INTRAVENOUS | Status: DC | PRN
Start: 2013-11-21 — End: 2013-11-22

## 2013-11-21 MED ORDER — SODIUM CHLORIDE 0.9 % IJ SOLN
3.0000 mL | Freq: Two times a day (BID) | INTRAMUSCULAR | Status: DC
  Administered 2013-11-21: 3 mL via INTRAVENOUS

## 2013-11-21 MED ORDER — METOPROLOL SUCCINATE 12.5 MG HALF TABLET
12.5000 mg | ORAL_TABLET | Freq: Every day | ORAL | Status: DC
  Administered 2013-11-21: 12.5 mg via ORAL
  Filled 2013-11-21 (×2): qty 1

## 2013-11-21 MED ORDER — ACETAMINOPHEN 325 MG PO TABS: 650.0000 mg | ORAL_TABLET | ORAL | Status: DC | PRN

## 2013-11-21 MED ORDER — ASPIRIN EC 81 MG PO TBEC
81.0000 mg | DELAYED_RELEASE_TABLET | Freq: Every day | ORAL | Status: DC
  Administered 2013-11-21: 81 mg via ORAL
  Filled 2013-11-21 (×2): qty 1

## 2013-11-21 MED ORDER — MIDAZOLAM HCL 5 MG/5ML IJ SOLN
INTRAMUSCULAR | Status: AC
  Filled 2013-11-21: qty 5

## 2013-11-21 MED ORDER — ONDANSETRON HCL 4 MG/2ML IJ SOLN: 4.0000 mg | Freq: Four times a day (QID) | INTRAMUSCULAR | Status: DC | PRN

## 2013-11-21 MED ORDER — ESCITALOPRAM OXALATE 10 MG PO TABS
10.0000 mg | ORAL_TABLET | Freq: Every day | ORAL | Status: DC
  Administered 2013-11-21: 10 mg via ORAL
  Filled 2013-11-21 (×2): qty 1

## 2013-11-21 MED ORDER — LEVOTHYROXINE SODIUM 25 MCG PO TABS
25.0000 ug | ORAL_TABLET | Freq: Every day | ORAL | Status: DC
  Administered 2013-11-21: 25 ug via ORAL
  Filled 2013-11-21 (×5): qty 1

## 2013-11-21 MED ORDER — FENTANYL CITRATE 0.05 MG/ML IJ SOLN
INTRAMUSCULAR | Status: AC
  Filled 2013-11-21: qty 2

## 2013-11-21 MED ORDER — ASPIRIN 81 MG PO TBEC: 81.0000 mg | DELAYED_RELEASE_TABLET | Freq: Every day | ORAL | Status: DC

## 2013-11-21 MED ORDER — SODIUM CHLORIDE 0.9 % IJ SOLN: 3.0000 mL | INTRAMUSCULAR | Status: DC | PRN

## 2013-11-21 NOTE — CV Procedure (Signed)
EPS/RFA of AVNRT without immediate complication. [id number].

## 2013-11-21 NOTE — H&P (View-Only) (Signed)
      Patient Care Team: Tommy Medal, MD as PCP - General (Internal Medicine) Larey Dresser, MD (Cardiology) Liliane Shi, PA-C as Physician Assistant (Physician Assistant)   HPI  [name] is a 63 y.o. female Seen after a hiatus of a couple years. At that time he was seen for Dr. DM and had palpitations that were suggestive of AV reentry probably AV node reentry.  She had recurrent symptoms earlier this month and was seen in the emergency room. She was given adenosine with termination. It was a short RP tachycardia with a RP interval of approximately 120 ms  She has been having episodes couple times a month. she frequently awaken her at night. she was started on metoprolol and has slept better without recurrences.  Echocardiogram 2012 demonstrated normal left ventricular function left atrial enlargement  Past Medical History  Diagnosis Date  . Hypothyroidism   . Anxiety   . Palpitations   . Mitral valve prolapse     ?  Marland Kitchen Rectal leakage     1 time  . Rapid or irregular heartbeat     Past Surgical History  Procedure Laterality Date  . Breast lumpectomy  2000    left breast-  . Skin lesions      left leg,right leg  . Colonoscopy    . Nose surgery      1980    Current Outpatient Prescriptions  Medication Sig Dispense Refill  . aspirin 81 MG EC tablet Take 81 mg by mouth daily.        Marland Kitchen escitalopram (LEXAPRO) 10 MG tablet Take 10 mg by mouth daily.       Marland Kitchen levothyroxine (SYNTHROID) 25 MCG tablet Take 25 mcg by mouth daily.        . metoprolol succinate (TOPROL-XL) 25 MG 24 hr tablet Take 25 mg by mouth daily. 1/2 tablet daily      . Multiple Vitamin (MULTIVITAMIN) tablet Take 2 tablets by mouth daily.      . pravastatin (PRAVACHOL) 40 MG tablet Take 40 mg by mouth daily.      . Probiotic Product (PROBIOTIC DAILY PO) Take 1 tablet by mouth daily.        No current facility-administered medications for this visit.    No Known Allergies  Review of Systems  negative except from HPI and PMH  Physical Exam BP 145/78  Pulse 73  Ht 5\' 5"  (1.651 m)  Wt 155 lb (70.308 kg)  BMI 25.79 kg/m2 Well developed and nourished in no acute distress HENT normal Neck supple with JVP-flat Clear Regular rate and rhythm, no murmurs or gallops Abd-soft with active BS No Clubbing cyanosis edema Skin-warm and dry A & Oriented  Grossly normal sensory and motor function  ECG demonstrates normal sinus rhythm without preexcitation  ECG her tachycardia demonstrates a narrow QRS tachycardia short RP with RP interval of 100 ms.   Assessment and  Plan

## 2013-11-21 NOTE — Discharge Summary (Signed)
ELECTROPHYSIOLOGY PROCEDURE DISCHARGE SUMMARY    Patient ID: [name],  MRN: [id number], DOB/AGE: 01/16/1951 63 y.o.  Admit date: 11/21/2013 Discharge date: 11/22/2013  Primary Care Physician: Tommy Medal, MD Primary Cardiologist: Loralie Champagne, MD  Primary Discharge Diagnosis:  AVNRT status post ablation this admission  Secondary Discharge Diagnosis:  1.  Hypothyroidism 2.  Anxiety  No Known Allergies   Procedures This Admission:  1.  Electrophysiology study and radiofrequency catheter ablation of AVNRT on 11-21-2013 by Dr Lovena Le.  See his dictated op note for full details.  There were no early apparent complications.    Brief HPI: Mrs. Sandeen is a 63 year old female with a past medical history significant for hypothyroidism and SVT.  Her SVT has been recurrent and refractory to medical therapy with beta blockers.  She was referred to EP in the outpatient setting for evaluation of treatment options.  Risks, benefits, and alternatives to catheter ablation were reviewed with the patient who wished to proceed.    Hospital Course:  The patient was admitted and underwent EPS/RFCA of AVNRT by Dr Lovena Le on 11-21-13 with details as outlined in his operative report. She was monitored on telemetry overnight which demonstrated sinus rhythm.  Her neck and groin incisions were without complication.  She was evaluated by Dr Caryl Comes and considered stable for discharge to home.  She will follow up with Ileene Hutchinson PA in 6 weeks.   Discharge Vitals: Blood pressure 105/68, pulse 63, temperature 98.5 F (36.9 C), temperature source Oral, resp. rate 18, height 5\' 5"  (1.651 m), weight 153 lb 14.1 oz (69.8 kg), SpO2 100.00%.  Well developed and nourished in no acute distress HENT normal Neck supple with JVP-flat Clear Regular rate and rhythm, no murmurs or gallops Abd-soft with active BS No Clubbing cyanosis edema Skin-warm and dry  gron ok A & Oriented  Grossly normal sensory and motor  function  Labs:   Lab Results  Component Value Date   WBC 4.8 11/21/2013   HGB 11.9* 11/21/2013   HCT 35.6* 11/21/2013   MCV 86.6 11/21/2013   PLT 221 11/21/2013     Recent Labs Lab 11/21/13 0900  NA 143  K 3.9  CL 107  CO2 23  BUN 13  CREATININE 0.71  CALCIUM 8.9  GLUCOSE 108*    Discharge Medications:    Medication List    STOP taking these medications       metoprolol succinate 25 MG 24 hr tablet  Commonly known as:  TOPROL-XL      TAKE these medications       aspirin 81 MG EC tablet  Take 81 mg by mouth daily.     LEXAPRO 10 MG tablet  Generic drug:  escitalopram  Take 10 mg by mouth daily.     multivitamin tablet  Take 2 tablets by mouth daily.     pravastatin 40 MG tablet  Commonly known as:  PRAVACHOL  Take 40 mg by mouth daily.     PROBIOTIC DAILY PO  Take 1 tablet by mouth daily.     SYNTHROID 25 MCG tablet  Generic drug:  levothyroxine  Take 25 mcg by mouth daily.        Disposition:   Future Appointments Provider Department Dept Phone   01/07/2014 10:30 AM Azzie Roup Deno [facility] [place] [facility] [phone]   01/10/2014 1:30 PM Milus Banister, MD [place] [phone]     Follow-up Information   Follow  up with Ileene Hutchinson, PA-C On 01/07/2014. (At 10:30 AM (Dr. Tanna Furry PA))    Specialty:  Cardiology   Contact information:   [address] [place] [address] [phone]       Duration of Discharge Encounter: Greater than 30 minutes including physician time.  inttructions given followup sk 6 wks   Signed,

## 2013-11-21 NOTE — Interval H&P Note (Signed)
History and Physical Interval Note: Patient seen and examined. I discussed the ablation procedure with the patient including the risk/benefit/goals/expectations and she wishes to proceed.  11/21/2013 11:12 AM  [name]  has presented today for surgery, with the diagnosis of svt  The various methods of treatment have been discussed with the patient and family. After consideration of risks, benefits and other options for treatment, the patient has consented to  Procedure(s): SUPRAVENTRICULAR TACHYCARDIA ABLATION (N/A) as a surgical intervention .  The patient's history has been reviewed, patient examined, no change in status, stable for surgery.  I have reviewed the patient's chart and labs.  Questions were answered to the patient's satisfaction.     Mikle Bosworth.D.

## 2013-11-22 DIAGNOSIS — I471 Supraventricular tachycardia: Secondary | ICD-10-CM

## 2013-11-22 NOTE — Op Note (Signed)
[name], [name]                ACCOUNT NO.:  [id number]  MEDICAL RECORD NO.:  [id number]  LOCATION:  3W36C                        FACILITY:  [facility]  PHYSICIAN:  Champ Mungo. [name] Le, MD    DATE OF BIRTH:  25-Dec-1950  DATE OF PROCEDURE:  11/21/2013 DATE OF DISCHARGE:  11/22/2013                              OPERATIVE REPORT   PROCEDURE PERFORMED:  Electrophysiologic study and RF catheter ablation of AV node reentrant tachycardia.  INTRODUCTION:  The patient is a very pleasant 63 year old woman who has had a long-standing history of tachy palpitations, documented SVT at rates of 140 beats per minute.  She has been refractory to medical therapy.  She has had recurrent episodes lasting over 10 hours at any one time.  She has noted shortness of breath with these episodes.  She has been on beta-blocker therapy, but had recurrent SVT and now was referred for catheter ablation.  DESCRIPTION OF PROCEDURE:  After informed consent was obtained, the patient was taken to the diagnostic EP lab in a fasting state.  After the usual preparation and draping, intravenous fentanyl and midazolam was given for sedation.  A 6-French Hexapolar catheter was inserted percutaneously into the right jugular vein and advanced coronary sinus. A 6-French quadripolar catheter was inserted percutaneously in the right femoral vein and advanced to the right ventricle.  A 6-French quadripolar catheter was inserted percutaneously in the right femoral vein and advanced to His bundle region.  After measurement of the basic intervals, rapid ventricular pacing was carried out from the right ventricle and stepwise decreased down to 400 milliseconds where VA Wenckebach was observed.  During rapid ventricular pacing, the atrial activation sequence was midline decremental.  Next, programmed ventricular stimulation was carried out from the right ventricle at a base drive cycle length of 600 milliseconds.  The S1-S2 interval  was stepwise decreased from 540 milliseconds down to 380 milliseconds where the retrograde AV node, ERP was observed.  During programmed ventricular stimulation, the atrial activation sequence was midline decremental. Next, programmed atrial stimulation was carried out from the atrium at a base drive cycle length of 600 milliseconds.  The S1-S2 interval stepwise decreased from 540 milliseconds down to 380 milliseconds resulting in the initiation of SVT.  During SVT, PVCs replaced at the time of His bundle refractoriness which did not pre-excite the atrium. During SVT ventricular pacing demonstrated a VA/AV conduction sequence. Left midline atrial activation, and the above findings, a diagnosis of AV node reentrant tachycardia was made.  The 7-French quadripolar ablation catheter was maneuvered into the region of Koch's triangle bilaterally in the right femoral vein.  Mapping of Koch's triangle was carried out.  A total of 6 RF energy applications were delivered to sites 6 through 8 in Koch's triangle.  During RF energy application, there was accelerated junctional rhythm.  The patient was initially observed after 3 RF energy applications, and had no recurrent SVT or other arrhythmia.  Approximately 15 minutes after the initial rating period, additional rapid atrial pacing was carried out resulting in reinitiation of the patient's SVT.  The ablation catheter was then maneuvered back into Koch's triangle, and a size 5 in Koch's triangle, RF energy  was applied in short bursts.  A total of 3 additional RF energy applications were delivered.  The patient was then observed for approximately 15 minutes.  During this time, there was no recurrent slow pathway conduction and no inducible SVT.  This was despite an aggressive pacing protocol with rapid atrial pacing and programed atrial stimulation.  At this point, the catheters were removed.  Hemostasis was assured and the patient was returned to  her room in satisfactory condition.  COMPLICATIONS:  There were no immediate procedure complications.  RESULTS: 1. A.  Baseline ECG.  Baseline ECG demonstrates sinus rhythm with     normal axis and intervals. 2. B.  Baseline intervals.  The sinus node cycle length was 900     milliseconds.  QRS duration was 90 millisecond.  HV interval was 78     milliseconds. 3. C.  Rapid ventricular pacing.  Rapid ventricular pacing was carried     out from the right ventricle at a pacing cycle length of 600     milliseconds and stepwise decreased down to 400 milliseconds where     the AV Wenckebach cycle length was observed.  During rapid     ventricular pacing, the atrial activation was midline and     decremental. 4. D.  Programmed ventricular stimulation.  Programmed ventricular     stimulation was carried out from the right ventricle at a pacing     cycle length of 600 milliseconds.  The S1-S2 interval was stepwise     decreased from 540 milliseconds down to 380 milliseconds with     retrograde AV node, ERP was observed.  During programmed     ventricular stimulation, the atrial activation sequence was midline     decremental. 5. E.  Rapid atrial pacing.  Rapid atrial pacing was carried out from     the atrium at a base drive cycle length of 600 milliseconds and     stepwise decreased down to 370 milliseconds resulted in AV     Wenckebach.  During rapid atrial pacing, following ablation, there     was no inducible SVT. 6. F.  Programmed atrial stimulation.  Programmed atrial stimulation     was carried out from the atrium at a base drive cycle length of 600     milliseconds.  The S1-S2 interval stepwise decreased down to 380     milliseconds resulting in the initiation of SVT.  Following     catheter ablation, there was no inducible SVT. 7. G.  Arrhythmias observed. 8. 1.  AV node reentrant tachycardia initiation with programmed atrial     stimulation, the duration was sustained.   Termination was with     rapid atrial pacing. 9. H.  Mapping.  Mapping of Koch's triangle demonstrated normal size     and orientation. 54.Y.  RF energy application.  A total of 6 RF energy applications     were delivered to sites 5 through 8 in Koch's triangle.  Following     RF energy application, there was no evidence of any residual slow     pathway conduction.  CONCLUSION:  This study demonstrates successful electrophysiologic study and RF catheter ablation of AV node reentrant tachycardia with a total of 6 RF energy applications to sites 5-8 in [place] triangle.     Champ Mungo. [name] Le, MD     GWT/MEDQ  D:  11/21/2013  T:  11/22/2013  Job:  [id number]  cc:   Deboraha Sprang, MD,  FACC

## 2013-11-22 NOTE — Discharge Instructions (Signed)
No driving for 3 days. No lifting over 5 lbs for 1 week. No sexual activity for 1 week. Keep procedure site clean & dry. If you notice increased pain, swelling, bleeding or pus, call/return! You may shower, but no soaking baths/hot tubs/pools for 1 week. ° °

## 2013-12-12 DIAGNOSIS — Z8679 Personal history of other diseases of the circulatory system: Secondary | ICD-10-CM | POA: Insufficient documentation

## 2013-12-12 DIAGNOSIS — Z9889 Other specified postprocedural states: Secondary | ICD-10-CM

## 2013-12-12 HISTORY — DX: Personal history of other diseases of the circulatory system: Z86.79

## 2013-12-25 ENCOUNTER — Telehealth: Payer: Self-pay | Admitting: Internal Medicine

## 2013-12-25 NOTE — Telephone Encounter (Signed)
New Message  Pt is requesting a call back to discuss if it is safe for her to travel out of the country before her scheduled EPH and she will also need clearance for an upcomming colonoscopy.. Please call back to discuss

## 2013-12-25 NOTE — Telephone Encounter (Signed)
Ok to travel. Explained to pt that I have not received any clearance request from her physician - but that we would be glad to fill it out and have them fax request to our office. Pt agreeable to plan.

## 2013-12-30 ENCOUNTER — Telehealth: Payer: Self-pay | Admitting: Gastroenterology

## 2013-12-30 NOTE — Telephone Encounter (Signed)
PV scheduled for 01/30/14

## 2014-01-07 ENCOUNTER — Encounter: Payer: BC Managed Care – PPO | Admitting: Cardiology

## 2014-01-10 ENCOUNTER — Encounter: Payer: BC Managed Care – PPO | Admitting: Gastroenterology

## 2014-01-15 ENCOUNTER — Encounter: Payer: Self-pay | Admitting: Internal Medicine

## 2014-01-15 ENCOUNTER — Ambulatory Visit (INDEPENDENT_AMBULATORY_CARE_PROVIDER_SITE_OTHER): Payer: BC Managed Care – PPO | Admitting: Internal Medicine

## 2014-01-15 VITALS — BP 169/87 | HR 71 | Ht 65.0 in

## 2014-01-15 DIAGNOSIS — I498 Other specified cardiac arrhythmias: Secondary | ICD-10-CM

## 2014-01-15 DIAGNOSIS — I471 Supraventricular tachycardia: Secondary | ICD-10-CM

## 2014-01-15 NOTE — Patient Instructions (Addendum)
Your physician recommends that you continue on your current medications as directed. Please refer to the Current Medication list given to you today.  Your physician recommends that you schedule a follow-up appointment in: 3 months with Dr. Klein.  

## 2014-01-15 NOTE — Progress Notes (Signed)
      Patient Care Team: Tommy Medal, MD as PCP - General (Internal Medicine) Larey Dresser, MD (Cardiology) Liliane Shi, PA-C as Physician Assistant (Physician Assistant)   HPI  [name] is a 63 y.o. female Seen in followup for catheter ablation of AV node reentry undertaken and Dr. Elliot Cousin 2/15  She also has nocturnal palpitations. These have persisted despite her ablation. She is actually having palpitations today she comes into the office; electrocardiogram auscultation and palpation are all normal  Past Medical History  Diagnosis Date  . Hypothyroidism   . Anxiety   . 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 Mitral valve prolapse     ?  Marland Kitchen Rectal leakage     1 time  . Hyperlipemia     Past Surgical History  Procedure Laterality Date  . Breast lumpectomy  2000    left breast-  . Skin lesions      left leg,right leg  . Colonoscopy    . Nose surgery      1980  . Ablation  11/21/2013    RFCA of AVNRT by Dr Lovena Le    Current Outpatient Prescriptions  Medication Sig Dispense Refill  . aspirin 81 MG EC tablet Take 81 mg by mouth daily.        Marland Kitchen escitalopram (LEXAPRO) 5 MG tablet Take 5 mg by mouth daily. 1.5tabs for a total of 7 mg      . levothyroxine (SYNTHROID) 25 MCG tablet Take 25 mcg by mouth daily.        . Multiple Vitamin (MULTIVITAMIN) tablet Take 2 tablets by mouth daily.      . pravastatin (PRAVACHOL) 40 MG tablet Take 40 mg by mouth every other day.       . Probiotic Product (PROBIOTIC DAILY PO) Take 1 tablet by mouth daily.        No current facility-administered medications for this visit.    No Known Allergies  Review of Systems negative except from HPI and PMH  Physical Exam BP 169/87  Pulse 71  Ht 5\' 5"  (1.651 m) Well developed and well nourished in no acute distress HENT normal E scleral and icterus clear Neck Supple JVP flat; carotids brisk and full Clear to ausculation  Regular rate and rhythm, no murmurs  gallops or rub Soft with active bowel sounds No clubbing cyanosis  Edema Alert and oriented, grossly normal motor and sensory function Skin Warm and Dry   ECG demonstrates sinus rhythm 71 Intervals 20/08/42 Assessment and  Plan  AV node reentry status post catheter ablation  Palpitations with normal ECG  Elevated blood pressure  Anxiety The patient is having symptomatic palpitations the context of normal rhythm on ECG. I have discussed with her the benign nature of these. She will try and sleep on her right side. Will not pursue further diagnostic testing or therapeutic trials at this time.  There've been no further tachypalpitations.

## 2014-01-30 ENCOUNTER — Ambulatory Visit (AMBULATORY_SURGERY_CENTER): Payer: Self-pay | Admitting: *Deleted

## 2014-01-30 ENCOUNTER — Telehealth: Payer: Self-pay | Admitting: *Deleted

## 2014-01-30 VITALS — Ht 65.0 in | Wt 157.0 lb

## 2014-01-30 DIAGNOSIS — Z860100 Personal history of colon polyps, unspecified: Secondary | ICD-10-CM

## 2014-01-30 NOTE — Telephone Encounter (Signed)
[place] for direct colonoscopy, thanks for checking

## 2014-01-30 NOTE — Progress Notes (Signed)
No allergies to eggs or soy. No problems with anesthesia.  Pt given Emmi instructions for colonoscopy  No oxygen use  No diet drug use  Pt has MoviPrep from previously scheduled procedure

## 2014-01-30 NOTE — Telephone Encounter (Signed)
Pt aware ok to proceed with colonoscopy as scheduled

## 2014-01-30 NOTE — Telephone Encounter (Signed)
Dr Ardis Hughs;  Pt is scheduled for direct colonoscopy 02/19/14.  Pt was previously scheduled for colonoscopy in January but had to reschedule b/c of tachycardia.  She had heart ablation with Dr Caryl Comes 11/21/13.  Return OV 4/1 with Dr Caryl Comes is in [facility].  Pt says she discussed upcoming colonoscopy with him and he told her it was okay to proceed. Just want to make sure you are okay for her to be direct colonoscopy.  Thanks, Juliann Pulse

## 2014-02-05 ENCOUNTER — Encounter: Payer: Self-pay | Admitting: Gastroenterology

## 2014-02-12 ENCOUNTER — Ambulatory Visit: Payer: Self-pay | Admitting: Podiatry

## 2014-02-17 ENCOUNTER — Encounter: Payer: Self-pay | Admitting: Podiatry

## 2014-02-17 ENCOUNTER — Ambulatory Visit (INDEPENDENT_AMBULATORY_CARE_PROVIDER_SITE_OTHER): Payer: BC Managed Care – PPO | Admitting: Podiatry

## 2014-02-17 VITALS — BP 116/71 | HR 68 | Resp 16

## 2014-02-17 DIAGNOSIS — B351 Tinea unguium: Secondary | ICD-10-CM

## 2014-02-17 NOTE — Progress Notes (Signed)
Subjective:     Patient ID: [name], female   DOB: [birth year], 63 y.o.   MRN: [id number]  HPI patient presents stating I have these 3 nails on my left foot that the back and some tissue underneath it. Points to the big nail fourth and fifth nails that she states are thick and bother her   Review of Systems  All other systems reviewed and are negative.      Objective:   Physical Exam  Nursing note and vitals reviewed. Constitutional: She is oriented to person, place, and time.  Cardiovascular: Intact distal pulses.   Musculoskeletal: Normal range of motion.  Neurological: She is oriented to person, place, and time.  Skin: Skin is warm.   neurovascular status intact with muscle strength adequate and range of motion of the subtalar and midtarsal joint within normal limits. Patient is found to have well perfused digits and is found to have distal discoloration on the left hallux medial one half of the nailbed and thickness of the fourth and fifth nails with rotation of the toes. Also noted to have keratotic lesion sub-first metatarsal head left     Assessment:     Combination of traumatized nailbeds 145 left in combination with probable opportunistic fungus. Patient is found to have keratotic lesion sub-first metatarsal left    Plan:     H&P and conditions reviewed. Discussed treatment options and at this time do not recommend oral medication but did give the consideration for laser and aggressive topical treatment of the left nailbeds. Patient wants to pursue this and will have laser done of nails 14 and 5 left in the near future

## 2014-02-17 NOTE — Progress Notes (Signed)
   Subjective:    Patient ID: [name], female    DOB: [birth year], 63 y.o.   MRN: [id number]  HPI Comments: "I have some bad toenails"  Patient c/o thick, discolored toenails, 1st and 5th nails left for about 3 years. She has tried trimming, OTC meds, vicks vapor rub-no help. Seen here PCP but no treatment.  Also, there is a callused area 1st MPJ left.     Review of Systems  Neurological: Positive for headaches.  All other systems reviewed and are negative.      Objective:   Physical Exam        Assessment & Plan:

## 2014-02-17 NOTE — Patient Instructions (Signed)

## 2014-02-19 ENCOUNTER — Ambulatory Visit (AMBULATORY_SURGERY_CENTER): Payer: BC Managed Care – PPO | Admitting: Gastroenterology

## 2014-02-19 ENCOUNTER — Encounter: Payer: Self-pay | Admitting: Gastroenterology

## 2014-02-19 VITALS — BP 93/50 | HR 61 | Temp 96.5°F | Resp 15 | Ht 65.0 in | Wt 157.0 lb

## 2014-02-19 DIAGNOSIS — D126 Benign neoplasm of colon, unspecified: Secondary | ICD-10-CM

## 2014-02-19 DIAGNOSIS — Z860100 Personal history of colon polyps, unspecified: Secondary | ICD-10-CM

## 2014-02-19 DIAGNOSIS — K5730 Diverticulosis of large intestine without perforation or abscess without bleeding: Secondary | ICD-10-CM

## 2014-02-19 MED ORDER — SODIUM CHLORIDE 0.9 % IV SOLN: 500.0000 mL | INTRAVENOUS | Status: DC

## 2014-02-19 NOTE — Patient Instructions (Signed)
YOU HAD AN ENDOSCOPIC PROCEDURE TODAY AT THE [facility]: Refer to the procedure report that was given to you for any specific questions about what was found during the examination.  If the procedure report does not answer your questions, please call your gastroenterologist to clarify.  If you requested that your care partner not be given the details of your procedure findings, then the procedure report has been included in a sealed envelope for you to review at your convenience later.  YOU SHOULD EXPECT: Some feelings of bloating in the abdomen. Passage of more gas than usual.  Walking can help get rid of the air that was put into your GI tract during the procedure and reduce the bloating. If you had a lower endoscopy (such as a colonoscopy or flexible sigmoidoscopy) you may notice spotting of blood in your stool or on the toilet paper. If you underwent a bowel prep for your procedure, then you may not have a normal bowel movement for a few days.  DIET: Your first meal following the procedure should be a light meal and then it is ok to progress to your normal diet.  A half-sandwich or bowl of soup is an example of a good first meal.  Heavy or fried foods are harder to digest and may make you feel nauseous or bloated.  Likewise meals heavy in dairy and vegetables can cause extra gas to form and this can also increase the bloating.  Drink plenty of fluids but you should avoid alcoholic beverages for 24 hours.  ACTIVITY: Your care partner should take you home directly after the procedure.  You should plan to take it easy, moving slowly for the rest of the day.  You can resume normal activity the day after the procedure however you should NOT DRIVE or use heavy machinery for 24 hours (because of the sedation medicines used during the test).    SYMPTOMS TO REPORT IMMEDIATELY: A gastroenterologist can be reached at any hour.  During normal business hours, 8:30 AM to 5:00 PM Monday through Friday,  call [phone].  After hours and on weekends, please call the GI answering service at [phone] who will take a message and have the physician on call contact you.   Following lower endoscopy (colonoscopy or flexible sigmoidoscopy):  Excessive amounts of blood in the stool  Significant tenderness or worsening of abdominal pains  Swelling of the abdomen that is new, acute  Fever of 100F or higher   FOLLOW UP: If any biopsies were taken you will be contacted by phone or by letter within the next 1-3 weeks.  Call your gastroenterologist if you have not heard about the biopsies in 3 weeks.  Our staff will call the home number listed on your records the next business day following your procedure to check on you and address any questions or concerns that you may have at that time regarding the information given to you following your procedure. This is a courtesy call and so if there is no answer at the home number and we have not heard from you through the emergency physician on call, we will assume that you have returned to your regular daily activities without incident.  SIGNATURES/CONFIDENTIALITY: You and/or your care partner have signed paperwork which will be entered into your electronic medical record.  These signatures attest to the fact that that the information above on your After Visit Summary has been reviewed and is understood.  Full responsibility of the confidentiality of   this discharge information lies with you and/or your care-partner.   Resume medications. Information given on polyps,diverticulosis and high fiber diet with discharge instructions. 

## 2014-02-19 NOTE — Op Note (Signed)
[place]  [address] [place], [id number]   COLONOSCOPY PROCEDURE REPORT  PATIENT: [name], [name].  MR#: [id number] BIRTHDATE: 22-Feb-1951 , 62  yrs. old GENDER: Female ENDOSCOPIST: Milus Banister, MD REFERRED VP:[id number] Minna Antis, M.D. PROCEDURE DATE:  02/19/2014 PROCEDURE:   Colonoscopy with snare polypectomy and Submucosal injection, any substance First Screening Colonoscopy - Avg.  risk and is 50 yrs.  old or older - No.  Prior Negative Screening - Now for repeat screening. N/A  History of Adenoma - Now for follow-up colonoscopy & has been > or = to 3 yrs.  Yes hx of adenoma.  Has been 3 or more years since last colonoscopy.  Polyps Removed Today? Yes. ASA CLASS:   Class II INDICATIONS:Colonoscopy 09/2006 Dr.  Rose Fillers, [facility], done for screening; found 2 polyps (seems that one was >1cm however the report is handwritten and not perfectly legible), also diffuse diverticulosis; pathology TA with focal HGD. Repeat colonoscopy 2008, single TA removed MEDICATIONS: MAC sedation, administered by CRNA and propofol (Diprivan) 300mg  IV  DESCRIPTION OF PROCEDURE:   After the risks benefits and alternatives of the procedure were thoroughly explained, informed consent was obtained.  A digital rectal exam revealed no abnormalities of the rectum.   The LB PFC-H190 [id number]  endoscope was introduced through the anus and advanced to the cecum, which was identified by both the appendix and ileocecal valve. No adverse events experienced.   The quality of the prep was good.  The instrument was then slowly withdrawn as the colon was fully examined.  COLON FINDINGS: One polyp was found, piecemeal resected and sent to pathology.  This was 1.8cm across, flat, located just distal to the IC valve.  This was piecemeal resected with cold snare and snare/cautery.  Following resection the site was labled with Niger Ink.  There were multiple large diverticulum  throughout the entire colon.  The examination was otherwise normal.  Retroflexed views revealed no abnormalities. The time to cecum=4 minutes 39 seconds. Withdrawal time=22 minutes 04 seconds.  The scope was withdrawn and the procedure completed. COMPLICATIONS: There were no complications.  ENDOSCOPIC IMPRESSION: One polyp was found, piecemeal resected and sent to pathology. Following resection the site was labled with Niger Ink.  There were multiple large diverticulum throughout the entire colon.  The examination was otherwise normal.  RECOMMENDATIONS: You will receive a letter within 1-2 weeks with the results of your biopsy as well as final recommendations.  Please call my office if you have not received a letter after 3 weeks. You will likely need a repeat colonoscopy in 6 months given the piecemeal resection technique needed to removed the polyp.  eSigned:  Milus Banister, MD 02/19/2014 11:38 AM

## 2014-02-19 NOTE — Progress Notes (Signed)
  [facility] Post-op Note  Patient: [name]  Procedure(s) Performed: colonoscopy  Patient Location: [facility] - Recovery Area  Anesthesia Type: Deep Sedation/Propofol  Level of Consciousness: awake, oriented and patient cooperative  Airway and Oxygen Therapy: Patient Spontanous Breathing  Post-op Pain: none  Post-op Assessment:  Post-op Vital signs reviewed, Patient's Cardiovascular Status Stable, Respiratory Function Stable, Patent Airway, No signs of Nausea or vomiting and Pain level controlled  Post-op Vital Signs: Reviewed and stable  Complications: No apparent anesthesia complications  Nazaret Chea E Mohmed Farver 11:38 AM

## 2014-02-19 NOTE — Progress Notes (Signed)
Called to room to assist during endoscopic procedure.  Patient ID and intended procedure confirmed with present staff. Received instructions for my participation in the procedure from the performing physician.  

## 2014-02-20 ENCOUNTER — Telehealth: Payer: Self-pay | Admitting: *Deleted

## 2014-02-20 NOTE — Telephone Encounter (Signed)
  Follow up Call-  Call back number 02/19/2014  Post procedure Call Back phone  # [phone]  Permission to leave phone message Yes     Patient questions:  Do you have a fever, pain , or abdominal swelling? no Pain Score  0 *  Have you tolerated food without any problems? yes  Have you been able to return to your normal activities? yes  Do you have any questions about your discharge instructions: Diet   no Medications  no Follow up visit  no  Do you have questions or concerns about your Care? no  Actions: * If pain score is 4 or above: No action needed, pain <4.

## 2014-02-28 ENCOUNTER — Encounter: Payer: Self-pay | Admitting: Gastroenterology

## 2014-03-03 ENCOUNTER — Ambulatory Visit: Payer: BC Managed Care – PPO | Admitting: Podiatry

## 2014-03-05 ENCOUNTER — Ambulatory Visit: Payer: BC Managed Care – PPO | Admitting: Podiatry

## 2014-03-05 ENCOUNTER — Encounter: Payer: Self-pay | Admitting: Podiatry

## 2014-03-05 ENCOUNTER — Encounter: Payer: Self-pay | Admitting: Cardiology

## 2014-03-05 VITALS — BP 123/76 | HR 73 | Resp 16

## 2014-03-05 DIAGNOSIS — B351 Tinea unguium: Secondary | ICD-10-CM

## 2014-03-05 NOTE — Patient Instructions (Signed)

## 2014-03-05 NOTE — Progress Notes (Signed)
Subjective:     Patient ID: DENIM START, female   DOB: 1950/12/14, 63 y.o.   MRN: [id number]  HPI patient presents for laser of 3 nails on the left foot   Review of Systems     Objective:   Physical Exam Nails 145 left are discolored with yellow debris    Assessment:    mycotic nail infection with probable trauma 145 left    Plan:     Laser of 3 nails approximate 1200 pulses with patient tolerated procedures well

## 2014-04-14 ENCOUNTER — Ambulatory Visit (INDEPENDENT_AMBULATORY_CARE_PROVIDER_SITE_OTHER): Payer: BC Managed Care – PPO | Admitting: Podiatry

## 2014-04-14 ENCOUNTER — Encounter: Payer: Self-pay | Admitting: Podiatry

## 2014-04-14 DIAGNOSIS — B351 Tinea unguium: Secondary | ICD-10-CM

## 2014-04-14 NOTE — Progress Notes (Signed)
Subjective:     Patient ID: [name], female   DOB: May 18, 1951, 63 y.o.   MRN: [id number]  HPI patient states I think my nails are getting better   Review of Systems     Objective:   Physical Exam Nails appear improved on the first fourth and fifth of the left foot    Assessment:     Improving mycotic nail infection    Plan:     Laser of 3 nails approximate 1200 pulses tolerated well reappoint in 5 months earlier if issues occur

## 2014-05-16 ENCOUNTER — Encounter: Payer: Self-pay | Admitting: Internal Medicine

## 2014-05-16 ENCOUNTER — Ambulatory Visit (INDEPENDENT_AMBULATORY_CARE_PROVIDER_SITE_OTHER): Payer: BC Managed Care – PPO | Admitting: Internal Medicine

## 2014-05-16 VITALS — BP 130/81 | HR 73 | Ht 65.0 in | Wt 157.0 lb

## 2014-05-16 DIAGNOSIS — I498 Other specified cardiac arrhythmias: Secondary | ICD-10-CM

## 2014-05-16 DIAGNOSIS — I471 Supraventricular tachycardia: Secondary | ICD-10-CM

## 2014-05-16 DIAGNOSIS — R002 Palpitations: Secondary | ICD-10-CM

## 2014-05-16 NOTE — Patient Instructions (Signed)
Your physician recommends that you continue on your current medications as directed. Please refer to the Current Medication list given to you today.  Your physician wants you to follow-up in: 1 year with Dr. Klein.  You will receive a reminder letter in the mail two months in advance. If you don't receive a letter, please call our office to schedule the follow-up appointment.  

## 2014-05-16 NOTE — Progress Notes (Signed)
Patient Care Team: Tommy Medal, MD as PCP - General (Internal Medicine) Larey Dresser, MD (Cardiology) Liliane Shi, PA-C as Physician Assistant (Physician Assistant)   HPI  [name] is a 63 y.o. 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 She does report some palpitations which are not a new finding. She notices this more when she is on her left side at night trying to go to sleep. She does go to the gym several times a week and able to walk on a treadmill for 20-30 minutes without any symptomatic shortness breath or chest pain. She does have family history of CAD and is questioning if she needs a stress test due to history. She has gained weight,she is very unhappy about this. She did go off Lexapro several months ago and thinks is causing some weight gain since then.   Past Medical History  Diagnosis Date  . Hypothyroidism   . Anxiety   . 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 Mitral valve prolapse     ?  Marland Kitchen Rectal leakage     1 time  . Hyperlipemia   . Hemorrhoids     Past Surgical History  Procedure Laterality Date  . Breast lumpectomy Left 2000    left breast-  . Skin lesions      left leg,right leg  . Colonoscopy  2007, 2008  . Septoplasty      1980  . Ablation  11/21/2013    RFCA of AVNRT by Dr Lovena Le  . Rhinoplasty  1980    Current Outpatient Prescriptions  Medication Sig Dispense Refill  . aspirin 81 MG EC tablet Take 81 mg by mouth daily.        Marland Kitchen levothyroxine (SYNTHROID) 25 MCG tablet Take 25 mcg by mouth daily.        . Probiotic Product (PROBIOTIC DAILY PO) Take 1 tablet by mouth daily.       Marland Kitchen escitalopram (LEXAPRO) 5 MG tablet Take 5 mg by mouth daily. 1.5tabs for a total of 7 mg      . Multiple Vitamin (MULTIVITAMIN) tablet Take 2 tablets by mouth daily.      . pravastatin (PRAVACHOL) 40 MG tablet Take 40 mg by mouth every other day.        No current facility-administered medications for this  visit.    No Known Allergies  Review of Systems negative except from HPI and PMH  Physical Exam BP 130/81  Pulse 73  Ht 5\' 5"  (1.651 m)  Wt 157 lb (71.215 kg)  BMI 26.13 kg/m2 Well developed and well nourished in no acute distress HENT normal E scleral and icterus clear Neck Supple JVP flat; carotids brisk and full Clear to ausculation regular rate and rhythm, no murmurs gallops or rub Soft with active bowel sounds No clubbing cyanosis or Edema Alert and oriented, grossly normal motor and sensory function Skin Warm and Dry    Assessment and  Plan  1. S/P AVRNT doing well without further tachyarrhythmias. Some palps noted at night on left side, unchanged. Defers holter monitor.  2. Weight gain Encouraged exercise efforts. Some sob with climbing steps, concerned with family history of CAD, but thinks more likely weight gain. Offered stress test, declined, may want to do at later date.  3. H/o hyperlipidemia Currently has stopped statins due to  " controversy of drug".  4. Recheck in one year.

## 2014-06-17 ENCOUNTER — Encounter: Payer: Self-pay | Admitting: Gastroenterology

## 2014-09-17 ENCOUNTER — Ambulatory Visit (INDEPENDENT_AMBULATORY_CARE_PROVIDER_SITE_OTHER): Payer: BC Managed Care – PPO | Admitting: Podiatry

## 2014-09-17 ENCOUNTER — Encounter: Payer: Self-pay | Admitting: Podiatry

## 2014-09-17 DIAGNOSIS — B351 Tinea unguium: Secondary | ICD-10-CM

## 2014-09-17 NOTE — Progress Notes (Signed)
Subjective:     Patient ID: [name], female   DOB: 1950/11/14, 63 y.o.   MRN: [id number]  HPI patient presents stating the nails on the left foot seem better but the big one still has a small spot on it and I also have trouble underneath my first metatarsal   Review of Systems     Objective:   Physical Exam Neurovascular status intact no health issues noted with a small area on the medial side of the left big toe that irritated and white but other than that is doing okay and is noted to have mild discomfort in the tibial sesamoid left that she has trouble wearing heels shoe    Assessment:     Small mycotic spot on the left big toe and also probable chronic tibial sesamoiditis    Plan:     H&P and conditions discussed. I went ahead today and I lasered the area approximate 1000 pulses and a little bit on the fifth nail left and discussed the tibial sesamoid that if it should become more inflamed I want her to reappoint

## 2014-09-25 ENCOUNTER — Encounter (HOSPITAL_COMMUNITY): Payer: Self-pay | Admitting: Internal Medicine

## 2015-01-19 ENCOUNTER — Encounter: Payer: Self-pay | Admitting: Gastroenterology

## 2015-11-17 ENCOUNTER — Encounter: Payer: Self-pay | Admitting: Gastroenterology

## 2015-12-08 ENCOUNTER — Ambulatory Visit (AMBULATORY_SURGERY_CENTER): Payer: Self-pay

## 2015-12-08 VITALS — Ht 65.0 in | Wt 158.8 lb

## 2015-12-08 DIAGNOSIS — Z86010 Personal history of colonic polyps: Secondary | ICD-10-CM

## 2015-12-08 MED ORDER — NA SULFATE-K SULFATE-MG SULF 17.5-3.13-1.6 GM/177ML PO SOLN: ORAL | Status: DC

## 2015-12-08 NOTE — Progress Notes (Signed)
Per pt, no allergies to soy or egg products.Pt not taking any weight loss meds or using  O2 at home. 

## 2015-12-22 ENCOUNTER — Ambulatory Visit (AMBULATORY_SURGERY_CENTER): Payer: BLUE CROSS/BLUE SHIELD | Admitting: Gastroenterology

## 2015-12-22 ENCOUNTER — Encounter: Payer: Self-pay | Admitting: Gastroenterology

## 2015-12-22 VITALS — BP 104/61 | HR 67 | Temp 98.0°F | Resp 13 | Ht 65.0 in | Wt 158.0 lb

## 2015-12-22 DIAGNOSIS — Z86010 Personal history of colonic polyps: Secondary | ICD-10-CM

## 2015-12-22 MED ORDER — SODIUM CHLORIDE 0.9 % IV SOLN: 500.0000 mL | INTRAVENOUS | Status: DC

## 2015-12-22 NOTE — Progress Notes (Addendum)
Pt. Stated that she feels light headed, pt. Placed in consultation room to allow time to feel better.1438 pt. Stated that she is ready to go that she feels better and she is ready to go and eat,she stated that she thinks the crackers helped her in feeling better.

## 2015-12-22 NOTE — Progress Notes (Signed)
Report to PACU, RN, vss, BBS= Clear.  

## 2015-12-22 NOTE — Patient Instructions (Signed)
YOU HAD AN ENDOSCOPIC PROCEDURE TODAY AT THE [facility]:   Refer to the procedure report that was given to you for any specific questions about what was found during the examination.  If the procedure report does not answer your questions, please call your gastroenterologist to clarify.  If you requested that your care partner not be given the details of your procedure findings, then the procedure report has been included in a sealed envelope for you to review at your convenience later.  YOU SHOULD EXPECT: Some feelings of bloating in the abdomen. Passage of more gas than usual.  Walking can help get rid of the air that was put into your GI tract during the procedure and reduce the bloating. If you had a lower endoscopy (such as a colonoscopy or flexible sigmoidoscopy) you may notice spotting of blood in your stool or on the toilet paper. If you underwent a bowel prep for your procedure, you may not have a normal bowel movement for a few days.  Please Note:  You might notice some irritation and congestion in your nose or some drainage.  This is from the oxygen used during your procedure.  There is no need for concern and it should clear up in a day or so.  SYMPTOMS TO REPORT IMMEDIATELY:   Following lower endoscopy (colonoscopy or flexible sigmoidoscopy):  Excessive amounts of blood in the stool  Significant tenderness or worsening of abdominal pains  Swelling of the abdomen that is new, acute  Fever of 100F or higher    For urgent or emergent issues, a gastroenterologist can be reached at any hour by calling [phone].   DIET: Your first meal following the procedure should be a small meal and then it is ok to progress to your normal diet. Heavy or fried foods are harder to digest and may make you feel nauseous or bloated.  Likewise, meals heavy in dairy and vegetables can increase bloating.  Drink plenty of fluids but you should avoid alcoholic beverages for 24  hours.  ACTIVITY:  You should plan to take it easy for the rest of today and you should NOT DRIVE or use heavy machinery until tomorrow (because of the sedation medicines used during the test).    FOLLOW UP: Our staff will call the number listed on your records the next business day following your procedure to check on you and address any questions or concerns that you may have regarding the information given to you following your procedure. If we do not reach you, we will leave a message.  However, if you are feeling well and you are not experiencing any problems, there is no need to return our call.  We will assume that you have returned to your regular daily activities without incident.  If any biopsies were taken you will be contacted by phone or by letter within the next 1-3 weeks.  Please call us at [phone] if you have not heard about the biopsies in 3 weeks.    SIGNATURES/CONFIDENTIALITY: You and/or your care partner have signed paperwork which will be entered into your electronic medical record.  These signatures attest to the fact that that the information above on your After Visit Summary has been reviewed and is understood.  Full responsibility of the confidentiality of this discharge information lies with you and/or your care-partner.   Resume medications. Information given on diverticulosis and high fiber diet. 

## 2015-12-22 NOTE — Op Note (Signed)
[place]  [address]. [place], [id number]   COLONOSCOPY PROCEDURE REPORT  PATIENT: [name], [name]  MR#: [id number] BIRTHDATE: 1951/01/19 , 17  yrs. old GENDER: female ENDOSCOPIST: Milus Banister, MD PROCEDURE DATE:  12/22/2015 PROCEDURE:   Colonoscopy, surveillance First Screening Colonoscopy - Avg.  risk and is 50 yrs.  old or older - No.  Prior Negative Screening - Now for repeat screening. N/A  History of Adenoma - Now for follow-up colonoscopy & has been > or = to 3 yrs.  N/A  high risk ASA CLASS:   Class II INDICATIONS:Surveillance due to prior colonic neoplasia and Colonoscopy 09/2006 Dr.  Rose Fillers, [place] done for screening; found 2 polyps (seems that one was >1cm however the report is handwritten and not perfectly legible), also diffuse diverticulosis; pathology TA with focal HGD. Repeat colonoscopy 2008, single TA removed. Colonoscopy Dr. Ardis Hughs 02/2014 one 1.8cm ascending colon TVA removed in piecemeal fashion, site labeled with SPOT MEDICATIONS: Monitored anesthesia care and Propofol 200 mg IV  DESCRIPTION OF PROCEDURE:   After the risks benefits and alternatives of the procedure were thoroughly explained, informed consent was obtained.  The digital rectal exam revealed no abnormalities of the rectum.   The LB PFC-H190 [id number]  endoscope was introduced through the anus and advanced to the cecum, which was identified by both the appendix and ileocecal valve. No adverse events experienced.   The quality of the prep was good.  The instrument was then slowly withdrawn as the colon was fully examined. Estimated blood loss is zero unless otherwise noted in this procedure report.   COLON FINDINGS: There were multiple large diverticulum throughout the colon.  The site of 2015 ascending colon polypectomy was easily located with aid of previous SPOT injection tattoo.  There was no residual polyp at that site.  The colon was otherwise  normal. Retroflexed views revealed no abnormalities. The time to cecum = 5.4 Withdrawal time = 6.4   The scope was withdrawn and the procedure completed. COMPLICATIONS: There were no immediate complications.  ENDOSCOPIC IMPRESSION: There were multiple large diverticulum throughout the colon.  The site of 2015 ascending colon polypectomy was easily located with aid of previous SPOT injection tattoo.  There was no residual polyp at that site.  The colon was otherwise normal  RECOMMENDATIONS: Given your personal history of adenomatous (pre-cancerous) polyps, you will need a repeat colonoscopy in 5 years.  eSigned:  Milus Banister, MD 12/22/2015 1:54 PM

## 2015-12-23 ENCOUNTER — Telehealth: Payer: Self-pay

## 2015-12-23 NOTE — Telephone Encounter (Signed)
  Follow up Call-  Call back number 12/22/2015 02/19/2014  Post procedure Call Back phone  # [phone]  Permission to leave phone message Yes Yes     Patient questions:  Do you have a fever, pain , or abdominal swelling? No. Pain Score  0 *  Have you tolerated food without any problems? Yes.    Have you been able to return to your normal activities? Yes.    Do you have any questions about your discharge instructions: Diet   No. Medications  No. Follow up visit  No.  Do you have questions or concerns about your Care? No.  Actions: * If pain score is 4 or above: No action needed, pain <4.

## 2016-03-11 ENCOUNTER — Other Ambulatory Visit: Payer: Self-pay | Admitting: Internal Medicine

## 2016-03-11 DIAGNOSIS — J8410 Pulmonary fibrosis, unspecified: Secondary | ICD-10-CM

## 2016-03-22 ENCOUNTER — Ambulatory Visit
Admission: RE | Admit: 2016-03-22 | Discharge: 2016-03-22 | Disposition: A | Discharge status: Home / Self Care | Payer: BLUE CROSS/BLUE SHIELD | Source: Ambulatory Visit | Attending: Internal Medicine | Admitting: Internal Medicine

## 2016-03-22 DIAGNOSIS — J8410 Pulmonary fibrosis, unspecified: Secondary | ICD-10-CM

## 2016-03-24 ENCOUNTER — Other Ambulatory Visit: Payer: Self-pay | Admitting: Internal Medicine

## 2016-03-24 DIAGNOSIS — E041 Nontoxic single thyroid nodule: Secondary | ICD-10-CM

## 2016-03-25 ENCOUNTER — Ambulatory Visit
Admission: RE | Admit: 2016-03-25 | Discharge: 2016-03-25 | Disposition: A | Discharge status: Home / Self Care | Payer: BLUE CROSS/BLUE SHIELD | Source: Ambulatory Visit | Attending: Internal Medicine | Admitting: Internal Medicine

## 2016-03-25 DIAGNOSIS — E041 Nontoxic single thyroid nodule: Secondary | ICD-10-CM

## 2016-07-21 ENCOUNTER — Other Ambulatory Visit: Payer: Self-pay | Admitting: Internal Medicine

## 2016-07-21 DIAGNOSIS — R0602 Shortness of breath: Secondary | ICD-10-CM

## 2016-07-26 ENCOUNTER — Ambulatory Visit (INDEPENDENT_AMBULATORY_CARE_PROVIDER_SITE_OTHER): Payer: BLUE CROSS/BLUE SHIELD

## 2016-07-26 DIAGNOSIS — R0602 Shortness of breath: Secondary | ICD-10-CM | POA: Diagnosis not present

## 2016-07-26 LAB — EXERCISE TOLERANCE TEST
CHL CUP MPHR: 157 {beats}/min
CHL CUP RESTING HR STRESS: 74 {beats}/min
CSEPEDS: 0 s
CSEPPHR: 148 {beats}/min
Estimated workload: 10.1 METS
Exercise duration (min): 9 min
Percent HR: 94 %
RPE: 16

## 2016-09-22 ENCOUNTER — Other Ambulatory Visit: Payer: Self-pay | Admitting: Internal Medicine

## 2016-09-22 DIAGNOSIS — M79605 Pain in left leg: Secondary | ICD-10-CM

## 2016-09-28 ENCOUNTER — Ambulatory Visit
Admission: RE | Admit: 2016-09-28 | Discharge: 2016-09-28 | Disposition: A | Discharge status: Home / Self Care | Payer: BLUE CROSS/BLUE SHIELD | Source: Ambulatory Visit | Attending: Internal Medicine | Admitting: Internal Medicine

## 2016-09-28 DIAGNOSIS — M79605 Pain in left leg: Secondary | ICD-10-CM

## 2016-10-27 ENCOUNTER — Encounter: Payer: Self-pay | Admitting: Sports Medicine

## 2016-10-27 ENCOUNTER — Ambulatory Visit (INDEPENDENT_AMBULATORY_CARE_PROVIDER_SITE_OTHER): Payer: BLUE CROSS/BLUE SHIELD | Admitting: Sports Medicine

## 2016-10-27 VITALS — Ht 65.0 in | Wt 153.0 lb

## 2016-10-27 DIAGNOSIS — M205X2 Other deformities of toe(s) (acquired), left foot: Secondary | ICD-10-CM | POA: Diagnosis not present

## 2016-10-27 DIAGNOSIS — M2170 Unequal limb length (acquired), unspecified site: Secondary | ICD-10-CM | POA: Diagnosis not present

## 2016-10-27 NOTE — Progress Notes (Signed)
[name] - 66 y.o. female MRN [id number]  Date of birth: [birth year]  SUBJECTIVE:  Including CC & ROS.  CC: left foot and hip pain  Presents with 10 year history of left great toe pain intermittently, but mainly left greater trochanter pain and ITB pain.  Her great toe has been treated with inserts.  She does not like NSAIDs or injections.  Her greater trochanter pain has been diagnosed as bursitis.  It has not improved with typical exercises, such as hip abduction exercises and stretches.  She also complains of left calf pain on the lateral aspect just distal to left knee.  She denies back pain, numbness, tingling, muscle weakness.     ROS: No unexpected weight loss, fever, chills, swelling, instability, muscle pain, numbness/tingling, redness, otherwise see HPI   PMHx - Updated and reviewed.  Contributory factors include: HLD, hypothyroid PSHx - Updated and reviewed.  Contributory factors include:  Negative FHx - Updated and reviewed.  Contributory factors include:  CAD before age 24 Social Hx - Updated and reviewed. Contributory factors include: Negative Medications - reviewed   DATA REVIEWED: PCP office visit about hip pain  PHYSICAL EXAM:  VS: BP:   HR: bpm  TEMP: ( )  RESP:   HT:5\' 5"  (165.1 cm)   WT:153 lb (69.4 kg)  BMI:25.5 PHYSICAL EXAM: Gen: NAD, alert, cooperative with exam, well-appearing HEENT: clear conjunctiva,  CV:  no edema, capillary refill brisk, normal rate Resp: non-labored Skin: no rashes, normal turgor  Neuro: no gross deficits.  Psych:  alert and oriented  Hip: ROM IR: 45 Deg, ER: 45 Deg, Flexion: 120 Deg, Extension: 100 Deg, Abduction: 45 Deg, Adduction: 45 Deg Strength IR: 5/5, ER: 5/5, Flexion: 5/5, Extension: 5/5, Abduction: 5/5, Adduction: 5/5 Pelvic alignment unremarkable to inspection and palpation. Standing hip rotation and gait without trendelenburg sign / unsteadiness. Greater trochanter with tenderness to palpation mildly on  left.  Ankle & Foot: No visible swelling, ecchymosis, erythema, ulcers, +callus on plantar aspect of 1st MTP joint Arch: Normal w/o pes cavus or planus  Achilles tendon without nodules or tenderness No swelling of retrocalcaneal bursa No pain at MT heads Full in plantarflexion, dorsiflexion, inversion, and eversion of the foot; limited flexion and extension of the left 1st toe, full ROM on right 1st toe Strength: 5/5 in all directions. Sensation: intact Vascular: intact w/ dorsalis pedis & posterior tibialis pulses 2+ Stable lateral and medial ligaments; Negative Anterior drawer test  Gait  Overall balanced gait with appropriate base width and stride length  Foot: neutral strike w/o pronation or supination; + out-toeing 15 degrees; No foot slap or high steppage gait  Knee: extension and flexion adequate w/o genu varus or valgus  Hip: No circumduction or contralateral drop, posterior left innominate  Trunk: Neutral w/o lean  Leg length: 33 inches on left, 33.75 inches on right  Shoe evaluation: none   ASSESSMENT & PLAN:   Leg length discrepancy 3/16" heel wedge used to correct.  Out-toeing improved from 15 degrees to 5 degrees.  Exercises given, hip abduction, core, single leg balance.  She will follow up in 4-6 weeks.  Continue personal training exercises. Believe her pain is due to a biomechanical problem that is causing pain on the lateral calf and lateral thigh.  She has no weakness, but should upkeep her strength through these exercises and her previous regimen.    Patient was counseled reviewing diagnosis and treatment in detail, totaling in 45 minutes, over half of which was  spent in face to face counseling.

## 2016-10-27 NOTE — Assessment & Plan Note (Addendum)
3/16" heel wedge used to correct.  Out-toeing improved from 15 degrees to 5 degrees.  Exercises given, hip abduction, core, single leg balance.  She will follow up in 4-6 weeks.  Continue personal training exercises. Believe her pain is due to a biomechanical problem that is causing pain on the lateral calf and lateral thigh.  She has no weakness, but should upkeep her strength through these exercises and her previous regimen.

## 2016-12-08 ENCOUNTER — Encounter: Payer: Self-pay | Admitting: Sports Medicine

## 2016-12-08 ENCOUNTER — Ambulatory Visit (INDEPENDENT_AMBULATORY_CARE_PROVIDER_SITE_OTHER): Payer: BLUE CROSS/BLUE SHIELD | Admitting: Sports Medicine

## 2016-12-08 DIAGNOSIS — R269 Unspecified abnormalities of gait and mobility: Secondary | ICD-10-CM | POA: Diagnosis not present

## 2016-12-08 DIAGNOSIS — M67952 Unspecified disorder of synovium and tendon, left thigh: Secondary | ICD-10-CM | POA: Diagnosis not present

## 2016-12-08 DIAGNOSIS — M2170 Unequal limb length (acquired), unspecified site: Secondary | ICD-10-CM | POA: Diagnosis not present

## 2016-12-08 NOTE — Assessment & Plan Note (Signed)
Needs to increase frequency of her Hip abduction exercises  Reviewed 5 specific ones to do

## 2016-12-08 NOTE — Assessment & Plan Note (Signed)
Corrected on orthotic and in shoes

## 2016-12-08 NOTE — Assessment & Plan Note (Signed)
Custom orthotics prepared today  These seem to really balance gait  Reck after 2 months  I spent 45 minutes with this patient. Over 50% of visit was spend in counseling and coordination of care for problems with gait, chronic hip pain and weakness.

## 2016-12-08 NOTE — Progress Notes (Signed)
Chronic Left hip pain  Patient returns after we found leg length inequality and found biomechanical issues  She had correction added to her left foot for leg length  This with insole has reduced pain by 30%  Worries about lateral hip pain being chronic  Has had an MRI with no major findings  Exam  Anxious but in NAD BP (!) 141/80   Pulse 79   Ht 5\' 5"  (1.651 m)   Wt 153 lb (69.4 kg)   BMI 25.46 kg/m   Left leg short by 1.5 cms Good hip ROM Still with weakness on abduction testing on left All other mm groups normal Pronation on left Callus over MTP 1 medially  Gait with some trendelenburg to left and also with foot turnout left  Patient was fitted for a : standard, cushioned, semi-rigid orthotic. The orthotic was heated and afterward the patient stood on the orthotic blank positioned on the orthotic stand. The patient was positioned in subtalar neutral position and 10 degrees of ankle dorsiflexion in a weight bearing stance. After completion of molding, a stable base was applied to the orthotic blank. The blank was ground to a stable position for weight bearing. Size: 8 red EVA Base: blue med. EVA Posting: Tapered lift on left black EVA Additional orthotic padding: under MTP 1  Post orthotic gait - could balance; no trendelenbrug and pronation controlled

## 2017-02-02 ENCOUNTER — Ambulatory Visit (INDEPENDENT_AMBULATORY_CARE_PROVIDER_SITE_OTHER): Payer: BLUE CROSS/BLUE SHIELD | Admitting: Sports Medicine

## 2017-02-02 ENCOUNTER — Encounter: Payer: Self-pay | Admitting: Sports Medicine

## 2017-02-02 ENCOUNTER — Ambulatory Visit: Payer: Self-pay

## 2017-02-02 VITALS — BP 147/110 | Ht 65.0 in | Wt 153.0 lb

## 2017-02-02 DIAGNOSIS — M25561 Pain in right knee: Secondary | ICD-10-CM

## 2017-02-02 DIAGNOSIS — M2170 Unequal limb length (acquired), unspecified site: Secondary | ICD-10-CM | POA: Diagnosis not present

## 2017-02-02 DIAGNOSIS — M23203 Derangement of unspecified medial meniscus due to old tear or injury, right knee: Secondary | ICD-10-CM | POA: Diagnosis not present

## 2017-02-02 NOTE — Progress Notes (Signed)
[facility] Progress Note  Subjective:  [name] is a 66 y.o. female with history of leg length discrepancy (1.5 cm short on L) and chronic lateral hip pain who presents for R knee pain and f/u orthotics.   Right knee pain began suddenly 10 days ago after she squatted down to play with her dog. She felt like her R knee locked. Since then, she has had pain with flexing that knee. It hurts to get up from a squatting position (for example getting up from the toilet). She had swelling that has improved with icing. She has noted increased warmth of R knee. She has also tried hot showers and Vick's vapor rub. Stopped doing hip abduction exercises when knee pain started but got up to 4 lbs. Still doing cross-overs. She enjoys exercising and is disappointed this knee pain is keeping her from being active.  She had custom orthotics made 12/08/16 with a heel lift and padding under 1st MTP of L orthotic due to chronic painful callus. She says she stopped wearing them about 1 week ago because padding of L orthotic bothered her callus and bunion of L foot becoming more uncomfortable. She is wearing Finns orthotics instead and say these don't bother her callus or bunion, but these do not have a true heel lift.   For her chronic issues, she says her L hip and knee pain is a little better since taking a break from leg strengthening exercises. She continues to have a dull pain down her left lateral leg but frequency of nighttime awakenings has decreased.   ROS: No falls, no burning pain  Objective: Blood pressure (!) 147/110, height 5\' 5"  (1.651 m), weight 153 lb (69.4 kg). Body mass index is 25.46 kg/m. Constitutional: Well-appearing female in NAD Pulmonary/Chest: No respiratory distress.  Musculoskeletal: R knee appears swollen with suspected effusion. Minimal crepitus of R knee, none in L knee. No joint line tenderness of R or L knee. No pain over patellar tendon or with patellar grind  test. No pain with varus or valgus testing. Can flex L knee almost entirely back and R knee past 135 degrees. Strength 5/5 with knee extension and flexion, and dorsi and plantar flexion. Negative Thessaly's test.  Neurological: No LE sensory deficits.  Skin: Skin is warm and dry. No erythema or ecchymosis.  Psychiatric: Somewhat anxious affect.   Vitals reviewed  Ultrasound Right Knee:  Small compressible pocket of hypoechoic fluid in suorapatellar pouch Partial loss of cartilage within medial meniscus at midline only with hypoechoic change Other areas of meniscus show normal thickness Cartilage intact within lateral meniscus No joint line spurring Normal width of patellar tendon without evidence of swelling  Impression is some degenerative medial meniscus change but otherwise unremarkable  Ultrasound and interpretation by Wolfgang Phoenix. Fields, MD   Assessment/Plan: Degenerative tear of medial meniscus, right - Small area of cartilage loss within medial meniscus of R knee - Recommended wearing compression sleeve for swelling, icing, and trying arnica gel - Continue to work on hip abduction exercises  Leg length discrepancy - Removed MTP pad from L orthotic and ground down sole - Provided 3 heel lifts for patient to add to her other shoes with Finns orthotics   Follow-up in about 6 weeks to see if R knee pain has improved and if modified orthotics better.  Olene Floss, MD [place], PGY-2  I observed and examined the patient with the resident and agree with assessment and plan.  Note reviewed and modified by me. Stefanie Libel, MD

## 2017-02-02 NOTE — Patient Instructions (Signed)
[name],  Please wear the compression sleeve while active.  Try arnica gel. Ice for 15-20 minutes at a time a few times a day, if possible.   Add heel lifts to your shoes to help correct the 1.5 cm leg length discrepancy.  Have a good trip to [place]!

## 2017-02-02 NOTE — Assessment & Plan Note (Signed)
-   Small area of cartilage loss within medial meniscus of R knee - Recommended wearing compression sleeve for swelling, icing, and trying arnica gel - Continue to work on hip abduction exercises

## 2017-02-02 NOTE — Assessment & Plan Note (Signed)
-   Removed MTP pad from L orthotic and ground down sole - Provided 3 heel lifts for patient to add to her other shoes with Finns orthotics

## 2017-03-20 ENCOUNTER — Ambulatory Visit: Payer: Self-pay | Admitting: Podiatry

## 2017-03-23 ENCOUNTER — Ambulatory Visit (INDEPENDENT_AMBULATORY_CARE_PROVIDER_SITE_OTHER): Payer: BLUE CROSS/BLUE SHIELD | Admitting: Podiatry

## 2017-03-23 ENCOUNTER — Encounter: Payer: Self-pay | Admitting: Podiatry

## 2017-03-23 ENCOUNTER — Ambulatory Visit (INDEPENDENT_AMBULATORY_CARE_PROVIDER_SITE_OTHER): Payer: BLUE CROSS/BLUE SHIELD

## 2017-03-23 DIAGNOSIS — M2010 Hallux valgus (acquired), unspecified foot: Secondary | ICD-10-CM

## 2017-03-23 DIAGNOSIS — B351 Tinea unguium: Secondary | ICD-10-CM

## 2017-03-23 NOTE — Patient Instructions (Signed)

## 2017-03-24 NOTE — Progress Notes (Signed)
Subjective:    Patient ID: [name], female   DOB: 66 y.o.   MRN: [id number]   HPI patient presents with 2 concerns one being bunion deformity left and second being nail disease with thickness that she's had for a while. 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 At this time we will utilize wider shoes with soft leather in order to try to accommodate the bunion deformity and she will start formula 3 for the nail disease at this current time

## 2017-04-20 ENCOUNTER — Ambulatory Visit (INDEPENDENT_AMBULATORY_CARE_PROVIDER_SITE_OTHER): Payer: BLUE CROSS/BLUE SHIELD | Admitting: Sports Medicine

## 2017-04-20 DIAGNOSIS — M23203 Derangement of unspecified medial meniscus due to old tear or injury, right knee: Secondary | ICD-10-CM | POA: Diagnosis not present

## 2017-04-20 DIAGNOSIS — M67952 Unspecified disorder of synovium and tendon, left thigh: Secondary | ICD-10-CM | POA: Diagnosis not present

## 2017-04-20 NOTE — Assessment & Plan Note (Signed)
Good improvement on both exam and [name]  Cont with HEP Use ciompression Try to do some recumbent biking  Suggested trying PT pilates as I think she does not have significant DJD of RT knee and can improve function

## 2017-04-20 NOTE — Progress Notes (Signed)
RT Knee pain  Patient with f/u and has had good reduction in pain Compression sleeve seems to help Doing home exercises Still feels that too much walking would cause pain More pain with down steps or hills Yoga poses cause knee pain w too much bend  Past Hx LLI - we have given her correction Hip pain that seems to have resolved with lift  ROS Less swelling No locking No giving way  PE Pleasant F in NAD BP 120/80   RT Knee Knee: inspection with no erythema/ possible mild  Effusion/ no  obvious bony abnormalities. Palpation normal with no warmth or joint line tenderness or patellar tenderness or condyle tenderness. ROM normal in flexion and extension and lower leg rotation. Ligaments with solid consistent endpoints including ACL, PCL, LCL, MCL. Negative Mcmurray's and provocative meniscal tests. Non painful patellar compression. Patellar and quadriceps tendons unremarkable. Hamstring and quadriceps strength is normal.  The only pain was with trying 1 leg knee bend or flexion  Ultrasound of Right Knee  Much less fluid in SPP Now with mild or normal fluid only Still mild degenerative meniscal change medially but less impressive and less swelling than on last scan Lateral meniscus with linear split but no swelling PT and Quad tendon normal  Impression:  Ultrasound consistent with some degenerative meniscal change medially and improved appearance with resolving effusion  Ultrasound and interpretation by Wolfgang Phoenix. Oneida Alar, MD

## 2017-04-20 NOTE — Assessment & Plan Note (Signed)
This has improved with HEP and with use of lift

## 2017-07-13 ENCOUNTER — Ambulatory Visit (INDEPENDENT_AMBULATORY_CARE_PROVIDER_SITE_OTHER): Payer: BLUE CROSS/BLUE SHIELD | Admitting: Sports Medicine

## 2017-07-13 ENCOUNTER — Ambulatory Visit
Admission: RE | Admit: 2017-07-13 | Discharge: 2017-07-13 | Disposition: A | Discharge status: Home / Self Care | Payer: BLUE CROSS/BLUE SHIELD | Source: Ambulatory Visit | Attending: Sports Medicine | Admitting: Sports Medicine

## 2017-07-13 VITALS — BP 110/72 | Ht 65.0 in | Wt 156.0 lb

## 2017-07-13 DIAGNOSIS — M23203 Derangement of unspecified medial meniscus due to old tear or injury, right knee: Secondary | ICD-10-CM

## 2017-07-13 DIAGNOSIS — M25561 Pain in right knee: Secondary | ICD-10-CM

## 2017-07-13 NOTE — Progress Notes (Signed)
[facility] [address] [place], [place] [address] Phone: [phone] Fax: [phone]   Patient Name: [name] Date of Birth: 1951/07/10 Medical Record Number: [id number] Gender: female Date of Encounter: 07/13/2017  History of Present Illness:  [name] is a 66 y.o. very pleasant female patient who presents with the following:  Right knee swelling. Patient has a history of degenerative medical meniscus tear of her right knee for which she has been participating in pilates regularly and using a recumbent bike for therapy. She has been doing well however during a recent session her pilates instructor noticed that her right knee appeared more swollen and there was concern that she could not extend her right knee completely. Note on history patient tried to up bike from 20 to 30 mins that day.  PT instructor notes that she also increased RPM to above 3.   session was stopped early and she was instructed to follow up with her sports medicine physician. Today she denies pain. She does endorse some mild swelling. She has been very happy with her progress in pilates and on the bike.   Patient Active Problem List   Diagnosis Date Noted  . Degenerative tear of medial meniscus, right 02/02/2017  . Abnormality of gait 12/08/2016  . Tendinopathy of left gluteus medius 12/08/2016  . Leg length discrepancy 10/27/2016  . Hallux limitus of left foot 10/27/2016  . Paroxysmal supraventricular tachycardia ([place]) 11/21/2013  . AVNRT (AV nodal re-entry tachycardia) ([place]) 11/06/2013  . HYPERLIPIDEMIA TYPE I / IV 12/30/2010  . HYPERLIPIDEMIA-MIXED 12/30/2010   Past Medical History:  Diagnosis Date  . Anxiety   . AV Nodal Reentry Tachycardia    s/p RFCA GT 2/15  . Cataract   . Hemorrhoids   . Hyperlipemia   . Hypothyroidism   . Mitral valve prolapse    ?  Marland Kitchen Rectal leakage    1 time   Past Surgical History:  Procedure Laterality Date  .  ABLATION  11/21/2013   RFCA of AVNRT by Dr Lovena Le  . BREAST LUMPECTOMY Left 2000   left breast-  . CATARCT     RIGHT EYE IN 09/2015  . COLONOSCOPY  2007, 2008  . RETINAL DETACHMENT SURGERY     RIGHT EYE/SILICONE BUCKLE  . RHINOPLASTY  1980  . SEPTOPLASTY     1980  . skin lesions     left leg,right leg  . SUPRAVENTRICULAR TACHYCARDIA ABLATION N/A 11/21/2013   Procedure: SUPRAVENTRICULAR TACHYCARDIA ABLATION;  Surgeon: Evans Lance, MD;  Location: [facility] CATH LAB;  Service: Cardiovascular;  Laterality: N/A;   Social History  Substance Use Topics  . Smoking status: Never Smoker  . Smokeless tobacco: Never Used  . Alcohol use Yes   Family History  Problem Relation Age of Onset  . Heart attack Sister 68  . Heart disease Sister   . Heart attack Father 64  . Heart attack Mother 27  . Pancreatic cancer Mother   . Colon cancer Paternal Uncle 38   No Known Allergies  Medication list has been reviewed and updated.  Prior to Admission medications   Medication Sig Start Date End Date Taking? Authorizing Provider  aspirin 81 MG EC tablet Take 81 mg by mouth daily.      [provider]  levothyroxine (SYNTHROID) 25 MCG tablet Take 25 mcg by mouth daily.      [provider]    Review of Systems:  Negative aside from HPI. Denies  pain, endorses mild swelling.   Physical Examination: Vitals:   07/13/17 1032  BP: 110/72   Vitals:   07/13/17 1032  Weight: 156 lb (70.8 kg)  Height: 5\' 5"  (1.651 m)   Body mass index is 25.96 kg/m.  Constitutional: NAD, appears comfortable RT Knee: No erythema, possible mild effusion, no obvious bony abnormalities. ROM lacking ~5 degrees of full extension Ligaments stable Flexion is 140 deg and pain free Both aret improved from prior evaluation.  Strength intact and improved.   Now has normal hip abduction Gait: Normal - mild hesitation on RT Neurological: neurovascularly intact   Assessment and Plan:  Degenerative Tear  of Right Medial Meniscus: Possible mild effusion but overall strength and ROM are improving with pilates and recumbent bike therapy.  -- Continue current plan of care  -- Compressive sleeve for right knee during activity  -- Standing bilateral knee films to evaluate for degenerative changes  -- Follow up as needed   Velna Ochs, MD   I observed and examined the patient with the resident and agree with assessment and plan.  Note reviewed and modified by me. Stefanie Libel, MD

## 2017-07-13 NOTE — Assessment & Plan Note (Signed)
See progress note  Making good gains with conservative care and pilates

## 2017-07-17 ENCOUNTER — Ambulatory Visit: Payer: BLUE CROSS/BLUE SHIELD | Admitting: Sports Medicine

## 2017-08-09 DIAGNOSIS — E039 Hypothyroidism, unspecified: Secondary | ICD-10-CM | POA: Diagnosis not present

## 2017-08-09 DIAGNOSIS — Z8249 Family history of ischemic heart disease and other diseases of the circulatory system: Secondary | ICD-10-CM | POA: Diagnosis not present

## 2017-08-09 DIAGNOSIS — E663 Overweight: Secondary | ICD-10-CM | POA: Diagnosis not present

## 2017-08-09 DIAGNOSIS — Z0000 Encounter for general adult medical examination without abnormal findings: Secondary | ICD-10-CM | POA: Diagnosis not present

## 2017-09-04 DIAGNOSIS — Z1231 Encounter for screening mammogram for malignant neoplasm of breast: Secondary | ICD-10-CM | POA: Diagnosis not present

## 2017-09-04 DIAGNOSIS — Z124 Encounter for screening for malignant neoplasm of cervix: Secondary | ICD-10-CM | POA: Diagnosis not present

## 2017-10-06 DIAGNOSIS — E039 Hypothyroidism, unspecified: Secondary | ICD-10-CM | POA: Diagnosis not present

## 2017-10-06 DIAGNOSIS — E785 Hyperlipidemia, unspecified: Secondary | ICD-10-CM | POA: Diagnosis not present

## 2018-01-11 DIAGNOSIS — H02831 Dermatochalasis of right upper eyelid: Secondary | ICD-10-CM | POA: Diagnosis not present

## 2018-01-18 DIAGNOSIS — Z961 Presence of intraocular lens: Secondary | ICD-10-CM | POA: Diagnosis not present

## 2018-01-18 DIAGNOSIS — H26491 Other secondary cataract, right eye: Secondary | ICD-10-CM | POA: Diagnosis not present

## 2018-01-18 DIAGNOSIS — H2512 Age-related nuclear cataract, left eye: Secondary | ICD-10-CM | POA: Diagnosis not present

## 2018-01-18 DIAGNOSIS — H35373 Puckering of macula, bilateral: Secondary | ICD-10-CM | POA: Diagnosis not present

## 2018-02-14 DIAGNOSIS — L720 Epidermal cyst: Secondary | ICD-10-CM | POA: Diagnosis not present

## 2018-03-05 DIAGNOSIS — G4733 Obstructive sleep apnea (adult) (pediatric): Secondary | ICD-10-CM | POA: Diagnosis not present

## 2018-03-14 DIAGNOSIS — G4733 Obstructive sleep apnea (adult) (pediatric): Secondary | ICD-10-CM | POA: Diagnosis not present

## 2018-05-28 ENCOUNTER — Encounter: Payer: Self-pay | Admitting: Neurology

## 2018-05-29 ENCOUNTER — Encounter: Payer: Self-pay | Admitting: Neurology

## 2018-05-29 ENCOUNTER — Ambulatory Visit (INDEPENDENT_AMBULATORY_CARE_PROVIDER_SITE_OTHER): Payer: BLUE CROSS/BLUE SHIELD | Admitting: Neurology

## 2018-05-29 VITALS — BP 126/87 | HR 98 | Ht 64.5 in | Wt 144.0 lb

## 2018-05-29 DIAGNOSIS — F5103 Paradoxical insomnia: Secondary | ICD-10-CM | POA: Diagnosis not present

## 2018-05-29 DIAGNOSIS — E039 Hypothyroidism, unspecified: Secondary | ICD-10-CM

## 2018-05-29 DIAGNOSIS — G478 Other sleep disorders: Secondary | ICD-10-CM

## 2018-05-29 NOTE — Progress Notes (Signed)
SLEEP MEDICINE CLINIC   Provider:  Larey Seat, M D  Primary Care Physician:  [name], FNP   Referring Provider: [name], FNP    Chief Complaint  Patient presents with  . New Patient (Initial Visit)    pt alone, rm 11. pt states that she wakes up at night between 2-3 am and stays awake couple hours and then goes back to sleep. she has had two sleep tsudies one in [place] and another at [facility] within 15 years. she states can take 1hr before she falls asleep. she has tried [place], [place]    HPI:  [name] is a 67 y.o. female patient , seen here as in a referral from Dr. Shelia Media, and NP Adria Dill for a sleep evaluation.   05-29-2018, I have the pleasure of meeting with Mrs. Duffy Bruce today, who is a new patient to our practice and has a history of sleeping problems.  For many years now she wakes up every night around 3 AM and she has tried different sleep aids to avoid the early morning arousals.  She has twice been tested by polysomnography in the last 15 years, one time in [place], one time at the [facility] hospital.    She did not receive a diagnosis of apnea or an explanation for her insomnia. Nurse practitioner Adria Dill also ordered a home sleep test, ordered through home sleep testing, and performed on 05 Mar 2018.  Recording duration was 6 hours 22 minutes, oxygen desaturation index was 3.1/h of recording RDI 2.9 and heart rate stays in normal range between 55 bpm and 94 bpm, no significant apnea was identified.  No prolonged hypoxemia was noted. The study could not answer the patient's questions about reason for early morning arousals.     Sleep habits are as follows: The patient has been living alone since the death of her husband 6 years ago, patient sleep alone ,but little pet dog is in her bedroom. Dinner is between 5.30-6 PM, and she drinks hot decaffeinated tea after 5 PM. She watches TV alone, or is on her I pad until she goes to bed , in her bedroom at 9.30  PM - watches TV or you tube in bed- struggling to fall asleep. She has a detached retina in her right eye, and reading is strenuous. She therefor watches videos. She keeps the bedroom cool, but blue light/ screen light is always present. Sleep onset- may  Come around 10.55- and she can stay asleep for 1-2 hours at a time, she will wake up at 3.30 every night.  She would be awake for 2 hours if not looking at you tube videos to quell her thoughts, worries, and anxiety. She sleeps again for 2 hours. She will rise to take the dog out at 7.30- 8 AM. This has been a problem for over 20 years , before the death of her husband.Takes afternoon naps with the TV on - to quell the ruminating thoughts.   Sleep medical history and family sleep history:  Has been on Ambien, Lexapro, not now.   Has cardio-ablation for rapid ventricular response in 2014 . Dr Caryl Comes.   Social history:  6 cups of hot black tea,, decaffeinated. (No ETOH, which  triggers migraines), life long non smoker. Widowed, lives alone.  PR in [place], [place]. 1 son 67 in [place], 2 grandchildren, teenage.    Review of Systems: Out of a complete 14 system review, the patient complains of only the  following symptoms, and all other reviewed systems are negative.  Epworth score 3/ 24  , Fatigue severity score 27/ 63  , depression score 7/ 15 - clinically significant.   Was in grief counseling. Now going to a counselor again.    History of snoiring- was not heavy at the time- weight stayed similar for adult life.  Ruminating thought. Palpitations.    Social History   Socioeconomic History  . Marital status: Widowed    Spouse name: Not on file  . Number of children: Not on file  . Years of education: Not on file  . Highest education level: Not on file  Occupational History  . Occupation: Retired  Scientific laboratory technician  . Financial resource strain: Not on file  . Food insecurity:    Worry: Not on file    Inability: Not on file  . Transportation  needs:    Medical: Not on file    Non-medical: Not on file  Tobacco Use  . Smoking status: Never Smoker  . Smokeless tobacco: Never Used  Substance and Sexual Activity  . Alcohol use: Yes    Alcohol/week: 1.0 standard drinks    Types: 1 Glasses of wine per week  . Drug use: No  . Sexual activity: Not on file  Lifestyle  . Physical activity:    Days per week: Not on file    Minutes per session: Not on file  . Stress: Not on file  Relationships  . Social connections:    Talks on phone: Not on file    Gets together: Not on file    Attends religious service: Not on file    Active member of club or organization: Not on file    Attends meetings of clubs or organizations: Not on file    Relationship status: Not on file  . Intimate partner violence:    Fear of current or ex partner: Not on file    Emotionally abused: Not on file    Physically abused: Not on file    Forced sexual activity: Not on file  Other Topics Concern  . Not on file  Social History Narrative  . Not on file    Family History  Problem Relation Age of Onset  . Heart attack Sister 71  . Heart disease Sister   . Heart attack Father 30  . Heart attack Mother 46  . Pancreatic cancer Mother   . Colon cancer Paternal Uncle 7    Past Medical History:  Diagnosis Date  . Anxiety   . AV Nodal Reentry Tachycardia    s/p RFCA GT 2/15  . Cataract   . Headache   . Hemorrhoids   . Hyperlipemia   . Hypothyroidism   . Mitral valve prolapse    ?  Marland Kitchen Rectal leakage    1 time    Past Surgical History:  Procedure Laterality Date  . ABLATION  11/21/2013   RFCA of AVNRT by Dr Lovena Le  . BREAST LUMPECTOMY Left 2000   left breast-  . CATARCT     RIGHT EYE IN 09/2015  . COLONOSCOPY  2007, 2008  . RETINAL DETACHMENT SURGERY     RIGHT EYE/SILICONE BUCKLE  . RHINOPLASTY  1980  . SEPTOPLASTY     1980  . skin lesions     left leg,right leg  . SUPRAVENTRICULAR TACHYCARDIA ABLATION N/A 11/21/2013   Procedure:  SUPRAVENTRICULAR TACHYCARDIA ABLATION;  Surgeon: Evans Lance, MD;  Location: [facility] CATH LAB;  Service: Cardiovascular;  Laterality: N/A;    Current Outpatient Medications  Medication Sig Dispense Refill  . acetaminophen (TYLENOL) 500 MG tablet Take 500 mg by mouth every 6 (six) hours as needed.    Marland Kitchen aspirin 81 MG EC tablet Take 81 mg by mouth daily.      Marland Kitchen levothyroxine (SYNTHROID) 25 MCG tablet Take 25 mcg by mouth daily. Take 1.5 pill daily     No current facility-administered medications for this visit.     Allergies as of 05/29/2018 - Review Complete 05/29/2018  Allergen Reaction Noted  . Crestor [rosuvastatin calcium] Other (See Comments) 05/28/2018  . Pravastatin  05/28/2018    Vitals: BP 126/87   Pulse 98   Ht 5' 4.5" (1.638 m)   Wt 144 lb (65.3 kg)   BMI 24.34 kg/m  Last Weight:  Wt Readings from Last 1 Encounters:  05/29/18 144 lb (65.3 kg)   OXB:DZHG mass index is 24.34 kg/m.     Last Height:   Ht Readings from Last 1 Encounters:  05/29/18 5' 4.5" (1.638 m)    Physical exam:  General: The patient is awake, alert and appears not in acute distress. The patient is well groomed. Head: Normocephalic, atraumatic. Neck is supple. Mallampati 2  ,  neck circumference:14". Nasal airflow patent- has had a septoplasty, Cardiovascular:  Regular rate and rhythm , without  murmurs or carotid bruit, and without distended neck veins. Respiratory: Lungs are clear to auscultation. Skin:  Without evidence of edema, or rash Trunk: BMI is 24.3 . The patient's posture is erect  Neurologic exam :The patient is awake and alert, oriented to place and time.   Memory subjective described as intact.  Attention span & concentration ability appears normal. Speech is fluent,  without dysarthria, dysphonia or aphasia.  Mood and affect are appropriate.  Cranial nerves: Pupils are equal and briskly reactive to light. Funduscopic exam without evidence of pallor or edema. Extraocular movements   in vertical and horizontal planes intact and without nystagmus. Visual fields by finger perimetry are intact. Hearing to finger rub intact.  Facial sensation intact to fine touch. Facial motor strength is symmetric and tongue and uvula move midline. Shoulder shrug was symmetrical.   Motor exam: Normal tone, muscle bulk and symmetric strength in all extremities. Sensory:  Fine touch, pinprick and vibration were normal. Coordination: Rapid alternating movements in the fingers/hands intact  Finger-to-nose maneuver  normal without evidence of ataxia, dysmetria or tremor. Gait and station: Patient walks without assistive device  Deep tendon reflexes: in the  upper and lower extremities are symmetric and intact.    Assessment:  After physical and neurologic examination, review of laboratory studies,  Personal review of imaging studies, reports of other /same  Imaging studies, results of polysomnography and / or neurophysiology testing and pre-existing records as far as provided in visit., my assessment is   1) Early morning awakenings due to worries, ruminating thoughts. Anxiety may be fueled by caffeine, insomnia fueled by screen time.  We discussed alternatives- audio books would eliminate the blue light.   2) Insomnia - psychological and long entrained. Rec:  Melatonin 1-2 mg at bedtime.  SSRI will help- lexapro   3) T4 and uptake, TSH recheck, on synthroid. Al labs reviewed, has hyperlipidemia but normal CMET and CBC with diff.    The patient was advised of the nature of the diagnosed disorder , the treatment options and the  risks for general health and wellness arising from not treating the condition.  I spent more than 45 minutes of face to face time with the patient.  Greater than 50% of time was spent in counseling and coordination of care. We have discussed the diagnosis and differential and I answered the patient's questions.    Plan:  Treatment plan and additional workup :  Counseling  is essential for insomnia.  Melatonin OTC 1-2 mg at night.  I would like to see her on a SSRI.  Her HST was not indicated as it cannot evaluate insomnia- can not differentiate between awake and asleep time.  In lab study  recommended- for palpitation, insomnia, and arousals.    Larey Seat, MD 0/12/7046, 8:89 AM  Certified in Neurology by ABPN Certified in [place] by [facility] [address], [place], [place] [address]

## 2018-05-29 NOTE — Patient Instructions (Signed)
Please remember to try to maintain good sleep hygiene, which means: Keep a regular sleep and wake schedule, try not to exercise or have a meal within 2 hours of your bedtime, try to keep your bedroom conducive for sleep, that is, cool and dark, without light distractors such as an illuminated alarm clock, and refrain from watching TV right before sleep or in the middle of the night and do not keep the TV or radio on during the night. Also, try not to use or play on electronic devices at bedtime, such as your cell phone, tablet PC or laptop. If you like to read at bedtime on an electronic device, try to dim the background light as much as possible. Do not eat in the middle of the night.   We will request a sleep study and look at arousals.    We will look for leg twitching and snoring or sleep apnea.   For chronic insomnia, you are best followed by a psychiatrist and/or sleep psychologist.   We will call you with the sleep study results and make a follow up appointment if needed.

## 2018-05-30 LAB — TSH+FREE T4
FREE T4: 1.27 ng/dL (ref 0.82–1.77)
TSH: 2.38 u[IU]/mL (ref 0.450–4.500)

## 2018-05-31 ENCOUNTER — Telehealth: Payer: Self-pay | Admitting: Neurology

## 2018-05-31 NOTE — Telephone Encounter (Signed)
Pt returned RN's call.  She will be available after 1pm today

## 2018-05-31 NOTE — Telephone Encounter (Signed)
-----   Message from Larey Seat, MD sent at 05/30/2018  6:17 PM EDT ----- Normal TSH and T4 , free and uptake. This is not a contributing factor to poor sleep

## 2018-05-31 NOTE — Telephone Encounter (Signed)
Called the pt back and she wanted to make sure that the lab work was also sent to her PCP and I assured her that we did fax this information over. Also she had some missed calls and questioned whether they may have been from the sleep lab. I reached out to our scheduler and she plans to call the patient soon to get her scheduled. Pt request to be called after 4 today.

## 2018-05-31 NOTE — Telephone Encounter (Signed)
Called the pt to review the lab work. No answer. LVM informing of the normal levels. Instructed the patient to call back if she has any questions or concerns.  If pt returns call just make her aware that the lab work completed that assessed her thyroid levels were all normal. She states that thyroid levels would not play a factor in poor sleep since in normal. We will continue to move forward with sleep testing

## 2018-06-14 ENCOUNTER — Ambulatory Visit (INDEPENDENT_AMBULATORY_CARE_PROVIDER_SITE_OTHER): Payer: BLUE CROSS/BLUE SHIELD | Admitting: Neurology

## 2018-06-14 DIAGNOSIS — F5103 Paradoxical insomnia: Secondary | ICD-10-CM

## 2018-06-14 DIAGNOSIS — G478 Other sleep disorders: Secondary | ICD-10-CM

## 2018-06-14 DIAGNOSIS — E039 Hypothyroidism, unspecified: Secondary | ICD-10-CM

## 2018-06-14 DIAGNOSIS — G4733 Obstructive sleep apnea (adult) (pediatric): Secondary | ICD-10-CM | POA: Diagnosis not present

## 2018-06-18 NOTE — Procedures (Signed)
PATIENT'S NAME:  [name], [name] DOB:      [birth year]      MR#:    [id number]     DATE OF RECORDING: 06/14/2018 REFERRING M.D.:  Deland Pretty, MD Study Performed:   Baseline Polysomnogram HISTORY:  [name] is a 67 year old Caucasian female patient referred by NP [name] for an insomnia evaluation, and seen on 05-29-2018. Patient has undergone HST on Mar 05 2018 through PCP, but has not had any answers about the cause of her early morning arousals, usually interrupting her sleep at 3 AM. She will stay asleep for about 2 hours en bloc, sleeps always with some light emitting screen in the bedroom, playing you tube videos as to quell anxiety, racing thoughts, palpitations. She snores.   The patient endorsed the Epworth Sleepiness Scale at 3/24 points, The FSS at 27/63 and the Geriatric Depression Score at 7/15 points.   The patient's weight 144 pounds with a height of 64 (inches), resulting in a BMI of 24.2 kg/m2.The patient's neck circumference measured 14 inches.  CURRENT MEDICATIONS: Tylenol, Aspirin, Synthroid.   PROCEDURE:  This is a multichannel digital polysomnogram utilizing the Somnostar 11.2 system.  Electrodes and sensors were applied and monitored per AASM Specifications.   EEG, EOG, Chin and Limb EMG, were sampled at 200 Hz.  ECG, Snore and Nasal Pressure, Thermal Airflow, Respiratory Effort, CPAP Flow and Pressure, Oximetry was sampled at 50 Hz. Digital video and audio were recorded.      BASELINE STUDY: Lights Out was at 22:23 and Lights On at 05:03.  Total recording time (TRT) was 401 minutes, with a total sleep time (TST) of 360.5 minutes.   The patient's sleep latency was 79 minutes.  REM latency was 81 minutes. The sleep efficiency was 89.9 %.     SLEEP ARCHITECTURE: WASO (Wake after sleep onset) was 30.5 minutes.  There were 15.5 minutes in Stage N1, 214.5 minutes Stage N2, 71 minutes Stage N3 and 59.5 minutes in Stage REM. The percentage of Stage N1 was 4.3%,  Stage N2 was 59.5%, Stage N3 was 19.7% and Stage R (REM sleep) was 16.5%.   RESPIRATORY ANALYSIS:  There were a total of 41 respiratory events:  1 obstructive apnea, 8 central apneas and 1 mixed apnea with 31 hypopneas. The patient had 0 respiratory event related arousals (RERAs).     The total APNEA/HYPOPNEA INDEX (AHI) was 6.8/hour and the total RESPIRATORY DISTURBANCE INDEX was 6.8 /hour.  6 events occurred in REM sleep and 59 events in NREM. The REM AHI was 6.1 /hour, versus a non-REM AHI of 7.0. The patient spent 347 minutes of total sleep time in the supine position and 14 minutes in non-supine. The supine AHI was 7.1 versus a non-supine AHI of 0.0.  OXYGEN SATURATION & C02:  The Wake baseline 02 saturation was 96%, with the lowest being 89%. Time spent below 89% saturation equaled 0 minutes.   AROUSALS/ PERIODIC LIMB MOVEMENTS:  The patient had a total of 12 Periodic Limb Movements.  The Periodic Limb Movement (PLM) Arousal index was 0.5/hour. The arousals were noted as: 42 were spontaneous, 3 were associated with PLMs, and 21 were associated with respiratory events.     Audio and video analysis did not show any abnormal or unusual movements, behaviors, phonations or vocalizations.  No nocturia. NO hypoxemia. Normal EKG with some PVCs. Normal EEG.  Mild Snoring was noted.  Post-study, the patient indicated that sleep was deeper, longer and  better than usual. There was no prolonged period of wakefulness after sleep onset, and no restlessness.   IMPRESSION: 1. Very mild Obstructive Sleep Apnea (OSA), mild snoring and no associated hypoxemia.  2. Some PVCs during sleep EKG.   RECOMMENDATIONS: [name] was able to sustain sleep after the first hour and did not experience a prolonged arousal, or wakeful period. No sign of insomnia. This speaks for an underlying anxiety or hypervigilance that may not be present in a sheltered, attended environment but is affecting her at home. The lack of  sound and light in her laboratory bedroom will also promote better sleep. I would consider CPAP therapy optional in such a mild OSA, snoring was not loud enough to wake her.       I certify that I have reviewed the entire raw data recording prior to the issuance of this report in accordance with the Standards of Accreditation of the American Academy of Sleep Medicine (AASM)      Larey Seat, MD  06-17-2018  Diplomat, American Board of Psychiatry and Neurology  Diplomat, American Board of [place] Director, [facility] [employer]

## 2018-06-21 ENCOUNTER — Telehealth: Payer: Self-pay | Admitting: Neurology

## 2018-06-21 NOTE — Telephone Encounter (Signed)
-----   Message from Larey Seat, MD sent at 06/18/2018 12:43 PM EDT ----- Audio and video analysis did not show any abnormal or unusual  movements, behaviors, phonations or vocalizations. No nocturia.  NO hypoxemia. Normal EKG with some PVCs. Normal EEG. Mild  Snoring was noted.  Post-study, the patient indicated that sleep was deeper, longer  and better than usual. There was no prolonged period of  wakefulness after sleep onset, and no restlessness.   IMPRESSION: 1. Very mild Obstructive Sleep Apnea (OSA), mild snoring and no  associated hypoxemia.  2. Some PVCs during sleep EKG.   RECOMMENDATIONS: [name] was able to sustain sleep after the  first hour and did not experience a prolonged arousal, or wakeful  period. No sign of insomnia. This speaks for an underlying  anxiety or hypervigilance that may not be present in a sheltered,  attended environment but is affecting her at home. The lack of  sound and light in her laboratory bedroom will also promote  better sleep. I would consider CPAP therapy optional in such a mild OSA,  snoring was not loud enough to wake her.

## 2018-06-21 NOTE — Telephone Encounter (Signed)
Called the patient and reviewed the sleep study results with her. Pt has a upcoming apt in November and would like to keep the apt with Dr Dohmeier to review in more detail. She would like a copy of the study mailed to her home and a copy sent to PCP which has been done.   Discussed with the patient there was very mild apnea present but oxygen levels and EKG as well as EEG looked great with no concern. Informed the patient that she had a good sleep in this study which leads Dr Brett Fairy to believe that this could be underlying anxiety coming from enviorment at home that may be affecting her sleep.  Pt states the night prior she was up a lot through the night and typically she sleeps good the next night following a rough night. She states that sleep study fell on a night where she was exhausted from not sleeping the day before.  Pt was appreciative for the results.

## 2018-07-24 DIAGNOSIS — H43813 Vitreous degeneration, bilateral: Secondary | ICD-10-CM | POA: Diagnosis not present

## 2018-08-08 DIAGNOSIS — E7800 Pure hypercholesterolemia, unspecified: Secondary | ICD-10-CM | POA: Diagnosis not present

## 2018-08-08 DIAGNOSIS — E559 Vitamin D deficiency, unspecified: Secondary | ICD-10-CM | POA: Diagnosis not present

## 2018-08-13 DIAGNOSIS — F419 Anxiety disorder, unspecified: Secondary | ICD-10-CM | POA: Diagnosis not present

## 2018-08-13 DIAGNOSIS — E039 Hypothyroidism, unspecified: Secondary | ICD-10-CM | POA: Diagnosis not present

## 2018-08-13 DIAGNOSIS — E7800 Pure hypercholesterolemia, unspecified: Secondary | ICD-10-CM | POA: Diagnosis not present

## 2018-08-13 DIAGNOSIS — N649 Disorder of breast, unspecified: Secondary | ICD-10-CM | POA: Diagnosis not present

## 2018-08-13 DIAGNOSIS — E559 Vitamin D deficiency, unspecified: Secondary | ICD-10-CM | POA: Diagnosis not present

## 2018-08-13 DIAGNOSIS — Z0000 Encounter for general adult medical examination without abnormal findings: Secondary | ICD-10-CM | POA: Diagnosis not present

## 2018-08-16 DIAGNOSIS — L300 Nummular dermatitis: Secondary | ICD-10-CM | POA: Diagnosis not present

## 2018-08-27 DIAGNOSIS — Z189 Retained foreign body fragments, unspecified material: Secondary | ICD-10-CM | POA: Diagnosis not present

## 2018-08-27 DIAGNOSIS — T1501XA Foreign body in cornea, right eye, initial encounter: Secondary | ICD-10-CM | POA: Diagnosis not present

## 2018-08-29 DIAGNOSIS — Z23 Encounter for immunization: Secondary | ICD-10-CM | POA: Diagnosis not present

## 2018-09-03 ENCOUNTER — Ambulatory Visit: Payer: BLUE CROSS/BLUE SHIELD | Admitting: Neurology

## 2018-09-05 DIAGNOSIS — Z01419 Encounter for gynecological examination (general) (routine) without abnormal findings: Secondary | ICD-10-CM | POA: Diagnosis not present

## 2018-09-05 DIAGNOSIS — Z1231 Encounter for screening mammogram for malignant neoplasm of breast: Secondary | ICD-10-CM | POA: Diagnosis not present

## 2018-09-05 DIAGNOSIS — Z124 Encounter for screening for malignant neoplasm of cervix: Secondary | ICD-10-CM | POA: Diagnosis not present

## 2018-09-21 DIAGNOSIS — M8589 Other specified disorders of bone density and structure, multiple sites: Secondary | ICD-10-CM | POA: Diagnosis not present

## 2018-09-21 DIAGNOSIS — M810 Age-related osteoporosis without current pathological fracture: Secondary | ICD-10-CM | POA: Diagnosis not present

## 2018-09-21 DIAGNOSIS — Z853 Personal history of malignant neoplasm of breast: Secondary | ICD-10-CM | POA: Diagnosis not present

## 2018-10-08 DIAGNOSIS — E559 Vitamin D deficiency, unspecified: Secondary | ICD-10-CM | POA: Diagnosis not present

## 2018-10-08 DIAGNOSIS — E7800 Pure hypercholesterolemia, unspecified: Secondary | ICD-10-CM | POA: Diagnosis not present

## 2018-10-18 DIAGNOSIS — L432 Lichenoid drug reaction: Secondary | ICD-10-CM | POA: Diagnosis not present

## 2018-10-18 DIAGNOSIS — D485 Neoplasm of uncertain behavior of skin: Secondary | ICD-10-CM | POA: Diagnosis not present

## 2018-10-18 DIAGNOSIS — L280 Lichen simplex chronicus: Secondary | ICD-10-CM | POA: Diagnosis not present

## 2018-10-25 DIAGNOSIS — E039 Hypothyroidism, unspecified: Secondary | ICD-10-CM | POA: Diagnosis not present

## 2018-10-25 DIAGNOSIS — R079 Chest pain, unspecified: Secondary | ICD-10-CM | POA: Diagnosis not present

## 2018-10-25 DIAGNOSIS — E7800 Pure hypercholesterolemia, unspecified: Secondary | ICD-10-CM | POA: Diagnosis not present

## 2018-10-25 DIAGNOSIS — E559 Vitamin D deficiency, unspecified: Secondary | ICD-10-CM | POA: Diagnosis not present

## 2018-10-26 DIAGNOSIS — M810 Age-related osteoporosis without current pathological fracture: Secondary | ICD-10-CM | POA: Diagnosis not present

## 2018-11-06 DIAGNOSIS — R0789 Other chest pain: Secondary | ICD-10-CM | POA: Diagnosis not present

## 2018-11-06 DIAGNOSIS — R0602 Shortness of breath: Secondary | ICD-10-CM | POA: Diagnosis not present

## 2018-11-06 DIAGNOSIS — Z8249 Family history of ischemic heart disease and other diseases of the circulatory system: Secondary | ICD-10-CM | POA: Diagnosis not present

## 2018-11-06 DIAGNOSIS — Z9889 Other specified postprocedural states: Secondary | ICD-10-CM | POA: Diagnosis not present

## 2018-11-14 DIAGNOSIS — R0602 Shortness of breath: Secondary | ICD-10-CM | POA: Diagnosis not present

## 2018-12-12 ENCOUNTER — Encounter: Payer: Self-pay | Admitting: Cardiology

## 2018-12-12 ENCOUNTER — Ambulatory Visit (INDEPENDENT_AMBULATORY_CARE_PROVIDER_SITE_OTHER): Payer: Medicare Other | Admitting: Cardiology

## 2018-12-12 VITALS — BP 134/84 | HR 81 | Ht 65.0 in | Wt 143.7 lb

## 2018-12-12 DIAGNOSIS — R0789 Other chest pain: Secondary | ICD-10-CM

## 2018-12-12 DIAGNOSIS — Z8249 Family history of ischemic heart disease and other diseases of the circulatory system: Secondary | ICD-10-CM

## 2018-12-12 DIAGNOSIS — R0609 Other forms of dyspnea: Secondary | ICD-10-CM | POA: Diagnosis not present

## 2018-12-12 DIAGNOSIS — Z9889 Other specified postprocedural states: Secondary | ICD-10-CM | POA: Diagnosis not present

## 2018-12-12 DIAGNOSIS — R0600 Dyspnea, unspecified: Secondary | ICD-10-CM

## 2018-12-12 DIAGNOSIS — Z8679 Personal history of other diseases of the circulatory system: Secondary | ICD-10-CM

## 2018-12-12 DIAGNOSIS — Z0189 Encounter for other specified special examinations: Secondary | ICD-10-CM | POA: Diagnosis not present

## 2018-12-12 NOTE — Progress Notes (Signed)
Subjective:  Primary Physician:  [name], FNP  Patient ID: [name], female    DOB: September 24, 1951, 68 y.o.   MRN: [id number]  Chief Complaint  Patient presents with  . Chest Pain  . Follow-up    after testing, Last EKG 11/06/18    HPI: [name]  is a 68 y.o. female  with hyperlipidemia, hypothyroidism, history of migraines, family history of early CAD, prior second hand tobacco, history of AVRNT s/p ablation with Dr. Lovena Le in 2015, referred to [name] for evaluation chest pain. Was seen by [name] on 11/06/18 and now presents to discuss results. She also had c/o dyspnea on exertion. She is concerned about going on Fosamax  Past Medical History:  Diagnosis Date  . Anxiety   . AV Nodal Reentry Tachycardia    s/p RFCA GT 2/15  . AVNRT (AV nodal re-entry tachycardia) ([place]) 11/06/2013  . Cataract   . H/O radiofrequency ablation for complex left atrial arrhythmia 12/12/2013  . Headache   . Hemorrhoids   . Hyperlipemia   . Hypothyroidism   . Mitral valve prolapse    ?  Marland Kitchen Rectal leakage    1 time    Past Surgical History:  Procedure Laterality Date  . ABLATION  11/21/2013   RFCA of AVNRT by Dr Lovena Le  . BREAST LUMPECTOMY Left 2000   left breast-  . CATARCT     RIGHT EYE IN 09/2015  . COLONOSCOPY  2007, 2008  . RETINAL DETACHMENT SURGERY     RIGHT EYE/SILICONE BUCKLE  . RHINOPLASTY  1980  . SEPTOPLASTY     1980  . skin lesions     left leg,right leg  . SUPRAVENTRICULAR TACHYCARDIA ABLATION N/A 11/21/2013   Procedure: SUPRAVENTRICULAR TACHYCARDIA ABLATION;  Surgeon: Evans Lance, MD;  Location: [facility] CATH LAB;  Service: Cardiovascular;  Laterality: N/A;    Social History   Socioeconomic History  . Marital status: Widowed    Spouse name: Not on file  . Number of children: 1  . Years of education: Not on file  . Highest education level: Not on file  Occupational History  . Occupation: Retired  Scientific laboratory technician  . Financial resource strain: Not on file  . Food  insecurity:    Worry: Not on file    Inability: Not on file  . Transportation needs:    Medical: Not on file    Non-medical: Not on file  Tobacco Use  . Smoking status: Never Smoker  . Smokeless tobacco: Never Used  Substance and Sexual Activity  . Alcohol use: Yes    Alcohol/week: 1.0 standard drinks    Types: 1 Glasses of wine per week    Comment: socially  . Drug use: No  . Sexual activity: Not on file  Lifestyle  . Physical activity:    Days per week: Not on file    Minutes per session: Not on file  . Stress: Not on file  Relationships  . Social connections:    Talks on phone: Not on file    Gets together: Not on file    Attends religious service: Not on file    Active member of club or organization: Not on file    Attends meetings of clubs or organizations: Not on file    Relationship status: Not on file  . Intimate partner violence:    Fear of current or ex partner: Not on file    Emotionally abused: Not on file  Physically abused: Not on file    Forced sexual activity: Not on file  Other Topics Concern  . Not on file  Social History Narrative  . Not on file    Current Outpatient Medications on File Prior to Visit  Medication Sig Dispense Refill  . acetaminophen (TYLENOL) 500 MG tablet Take 500 mg by mouth every 6 (six) hours as needed.    Marland Kitchen atorvastatin (LIPITOR) 20 MG tablet Take 20 mg by mouth daily.    . Cholecalciferol (VITAMIN D3) 50 MCG (2000 UT) TABS Take 2,000 Units by mouth every other day. Taking 2 tablets    . FLUoxetine (PROZAC) 20 MG capsule Take 20 mg by mouth daily.    Marland Kitchen levothyroxine (SYNTHROID) 25 MCG tablet Take 25 mcg by mouth daily. Take 1.5 pill daily    . Melatonin 2.5 MG CAPS Take 2.5 mg by mouth at bedtime.    Marland Kitchen aspirin 81 MG EC tablet Take 81 mg by mouth daily.       No current facility-administered medications on file prior to visit.      Review of Systems  Constitutional: Negative for malaise/fatigue and weight loss.    Respiratory: Positive for shortness of breath (very mild). Negative for cough and hemoptysis.   Cardiovascular: Negative for chest pain, palpitations, claudication and leg swelling.  Gastrointestinal: Negative for abdominal pain, blood in stool, constipation, heartburn and vomiting.  Genitourinary: Negative for dysuria.  Musculoskeletal: Negative for joint pain and myalgias.  Neurological: Negative for dizziness, focal weakness and headaches.  Endo/Heme/Allergies: Does not bruise/bleed easily.  Psychiatric/Behavioral: Negative for depression. The patient is not nervous/anxious.   All other systems reviewed and are negative.      Objective:  Blood pressure 134/84, pulse 81, height '5\' 5"'  (1.651 m), weight 143 lb 11.2 oz (65.2 kg), SpO2 96 %. Body mass index is 23.91 kg/m.  Physical Exam  Constitutional: She appears well-developed and well-nourished. No distress.  HENT:  Head: Atraumatic.  Eyes: Conjunctivae are normal.  Neck: Neck supple. No JVD present. No thyromegaly present.  Cardiovascular: Normal rate, regular rhythm, normal heart sounds and intact distal pulses. Exam reveals no gallop.  No murmur heard. Pulmonary/Chest: Effort normal and breath sounds normal.  Abdominal: Soft. Bowel sounds are normal.  Musculoskeletal: Normal range of motion.        General: No edema.  Neurological: She is alert.  Skin: Skin is warm and dry.  Psychiatric: She has a normal mood and affect.    CARDIAC STUDIES:   GXT 07/26/2016: The patient exercised following the Bruce protocol. The patient reported no symptoms during the stress test. The patient experienced no angina during the stress test. The patient requested the test to be stopped. Blood pressure and heart rate demonstrated a normal response to exercise. Blood pressure demonstrated a normal response to exercise. Overall, the patient's exercise capacity was normal. 85% of maximum heart rate was achieved after 6.2 minutes. Recovery  time: 5 minutes. The patient's response to exercise was adequate for diagnosis. Response to Stress There was no ST segment deviation noted during stress. Arrhythmias during stress: rare PVCs. Arrhythmias during recovery: none. Arrhythmias were not significant. ECG was interpretable and there was no significant change from baseline.  Echocardiogram 11/14/2018: Left ventricle cavity is normal in size. Normal left ventricular shape. Low normal LV systolic function. Doppler evidence of grade I (impaired) diastolic dysfunction, normal LAP. Left ventricle regional wall motion findings: No wall motion abnormalities. Visual EF is 50-55%. Calculated EF 47%. Left atrial  cavity is mildly dilated at 4.2 cm. Mild aortic valve leaflet thickening. Trileaflet aortic valve with no regurgitation noted. Mild mitral valve leaflet thickening. Mild (Grade I) mitral regurgitation. No prolapse noted. No evidence of significant pericardial effusion. Anterior fat pad noted.   Assessment & Recommendations:   1. Atypical chest pain  Chronic calcium score 11/12/2018: Calcium score: 0.  2. H/O radiofrequency ablation for complex left atrial arrhythmia PSVT on 11/21/2013  3. Family history of premature CAD Mother had MI in her 55's, Father had MI in his 35's, and sister died from MI in her upper 28's  4. Dyspnea on exertion  5. Laboratory examination  10/11/2018: LDL-P 1080. Potassium 4.3, Creatinine 0.7, eGFR 81/93, CMP normal. Cholesterol 194, triglycerides 76, HDL 70, LDL 119.NHDL at goal at 122.  Vit D normal. RBC 4.15, Hct 36.1.  Recommendation:  I have reviewed the results of the recently performed coronary Calcium score which is 0 and I'm very pleased with this.  I have discussed with her that she is on appropriate medical therapy, she is also physically active, hence overall risk by going on therapy for osteoporosis would be relatively low.  Also to note her non-HDL cholesterol is at goal.  She could  certainly try Metamucil every day 1 teaspoon after dinner to further reduce LDL.  I also reviewed the results of the echocardiogram, essentially normal except for mild age-related diastolic dysfunction which can explain mild dyspnea on exertion that she has.  I do not suspect coronary artery disease. She continues to remain active without any chest pain.  Hence I did not repeat the stress test.  We also discussed regarding family history of premature coronary artery disease but her risks are well controlled.  I'll see her back in a year at her request.  Adrian Prows, MD, [facility] 12/12/2018, 11:31 AM [facility] [place] Pager: [phone] Office: [phone] If no answer Cell [phone]

## 2019-04-04 IMAGING — DX DG KNEE COMPLETE 4+V*L*
4 series · 4 of 4 positions shown · non-contrast
Comparison: None.

CLINICAL DATA: Knee pain.

EXAM:
LEFT KNEE - COMPLETE 4+ VIEW

[dg knee complete 4 views left (1 of 4)]
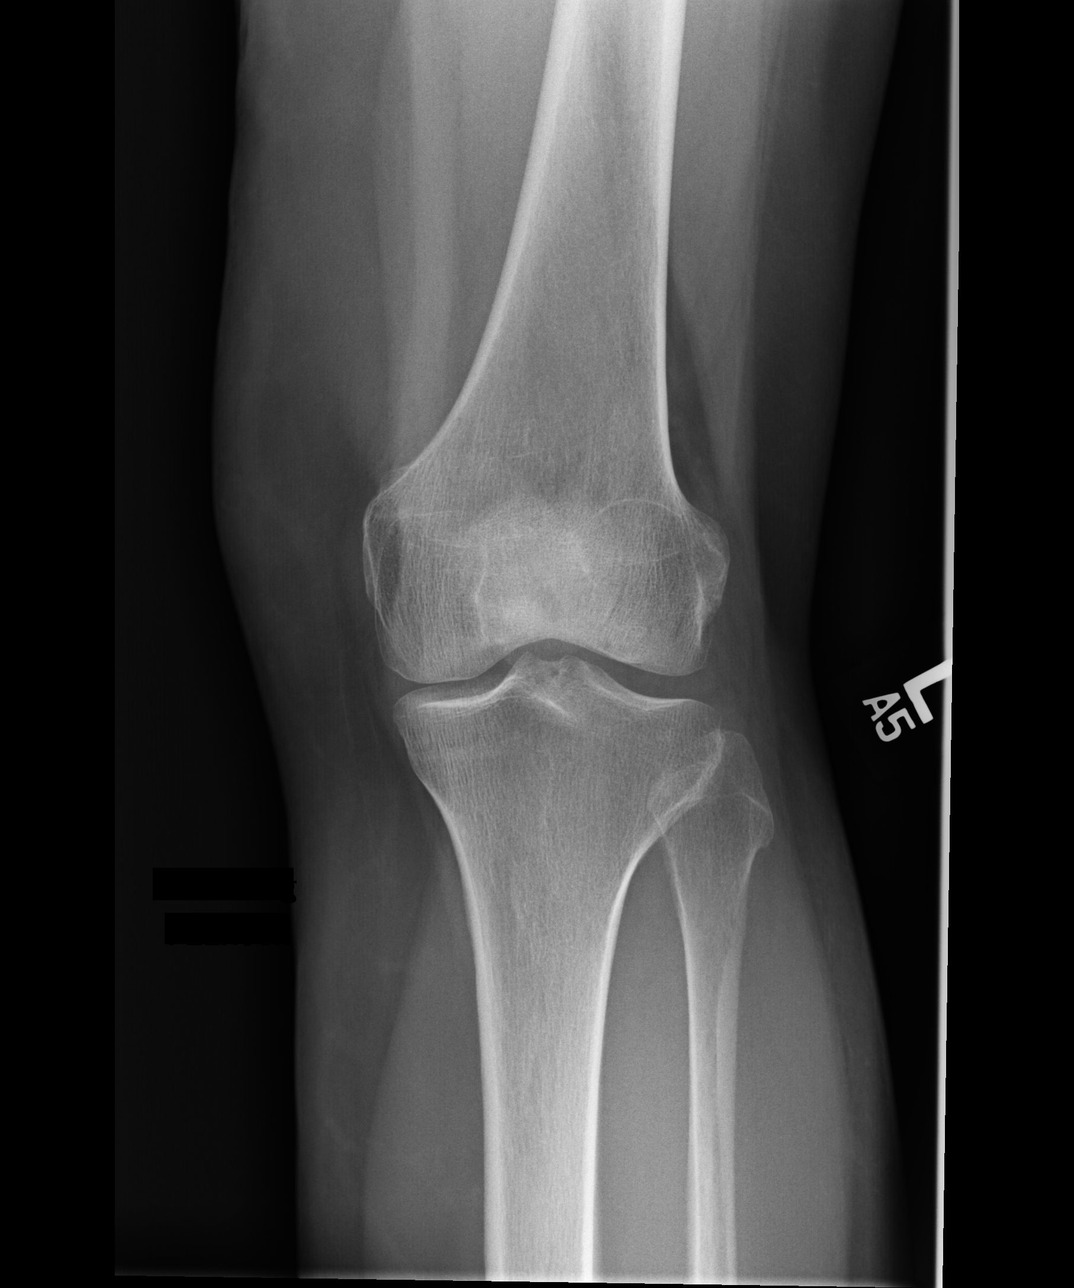

[dg knee complete 4 views left (2 of 4)]
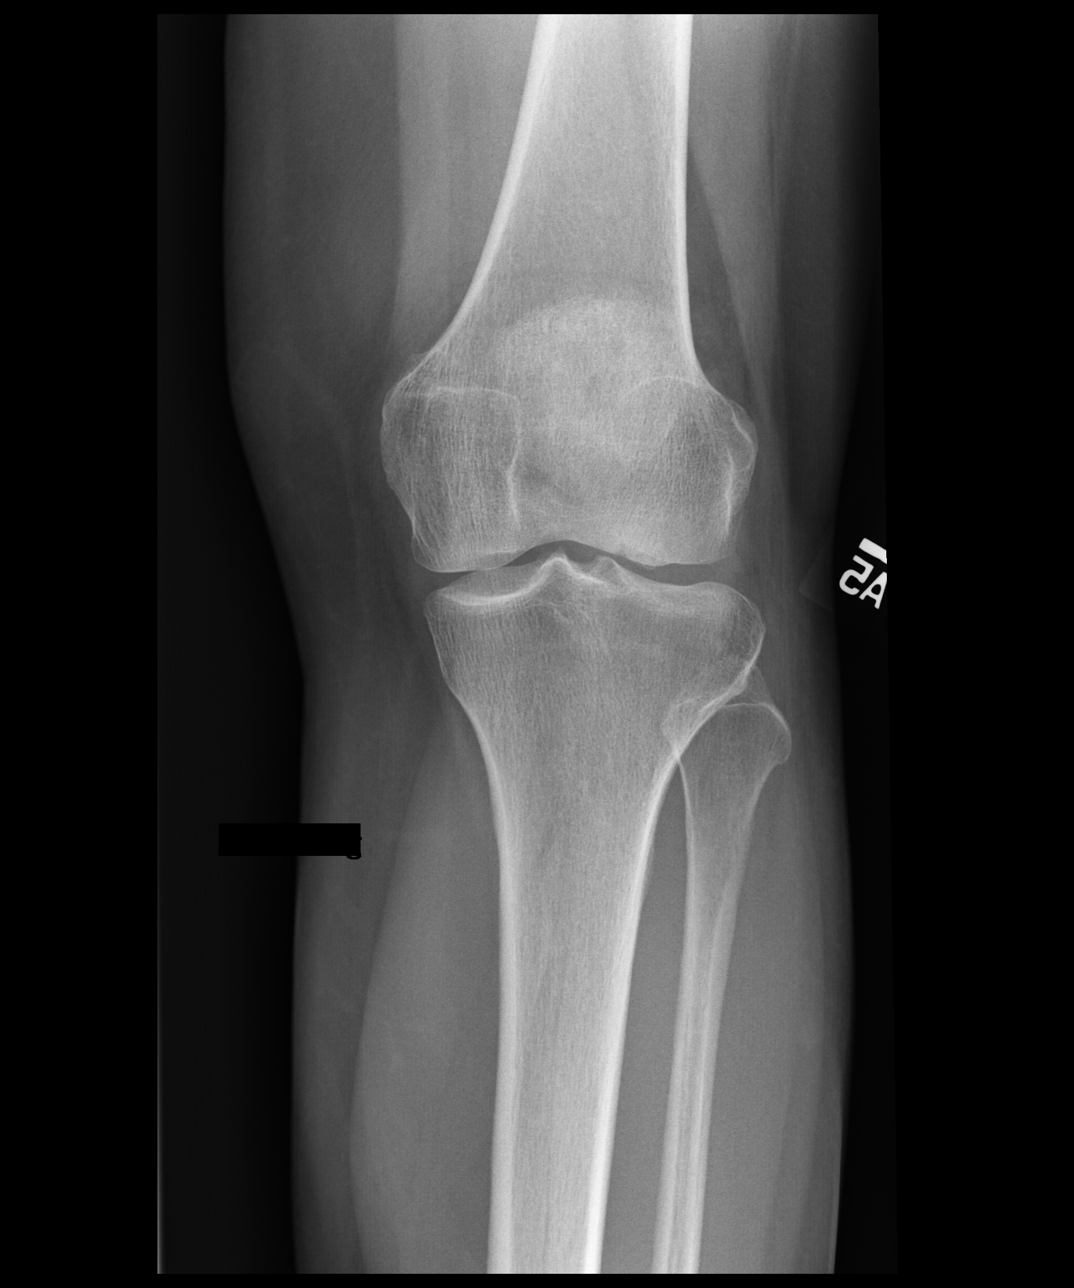

[dg knee complete 4 views left (3 of 4)]
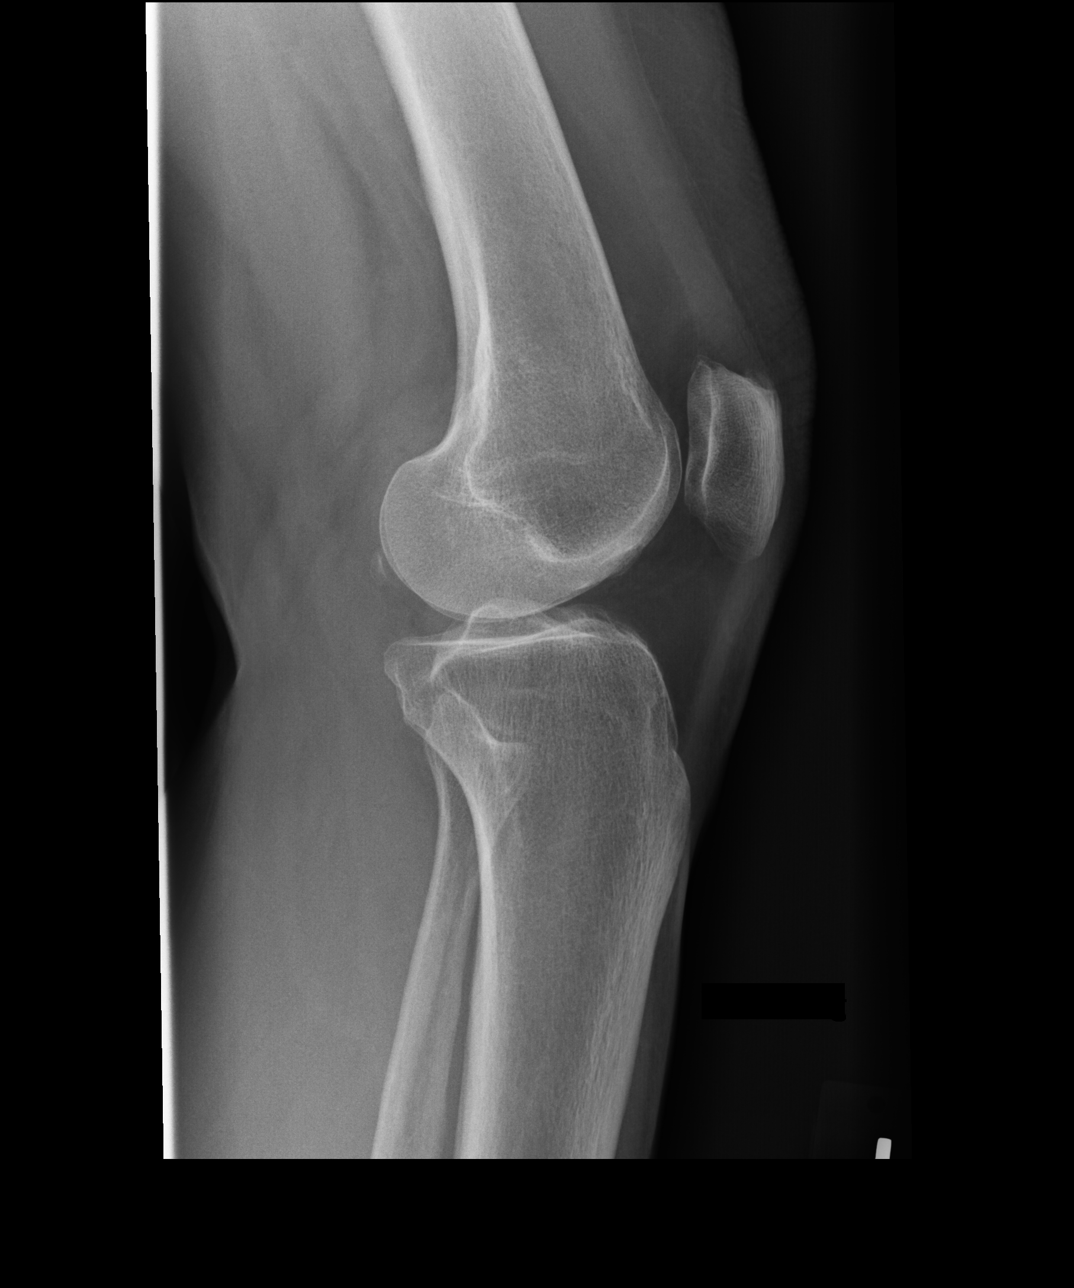

[dg knee complete 4 views left (4 of 4)]
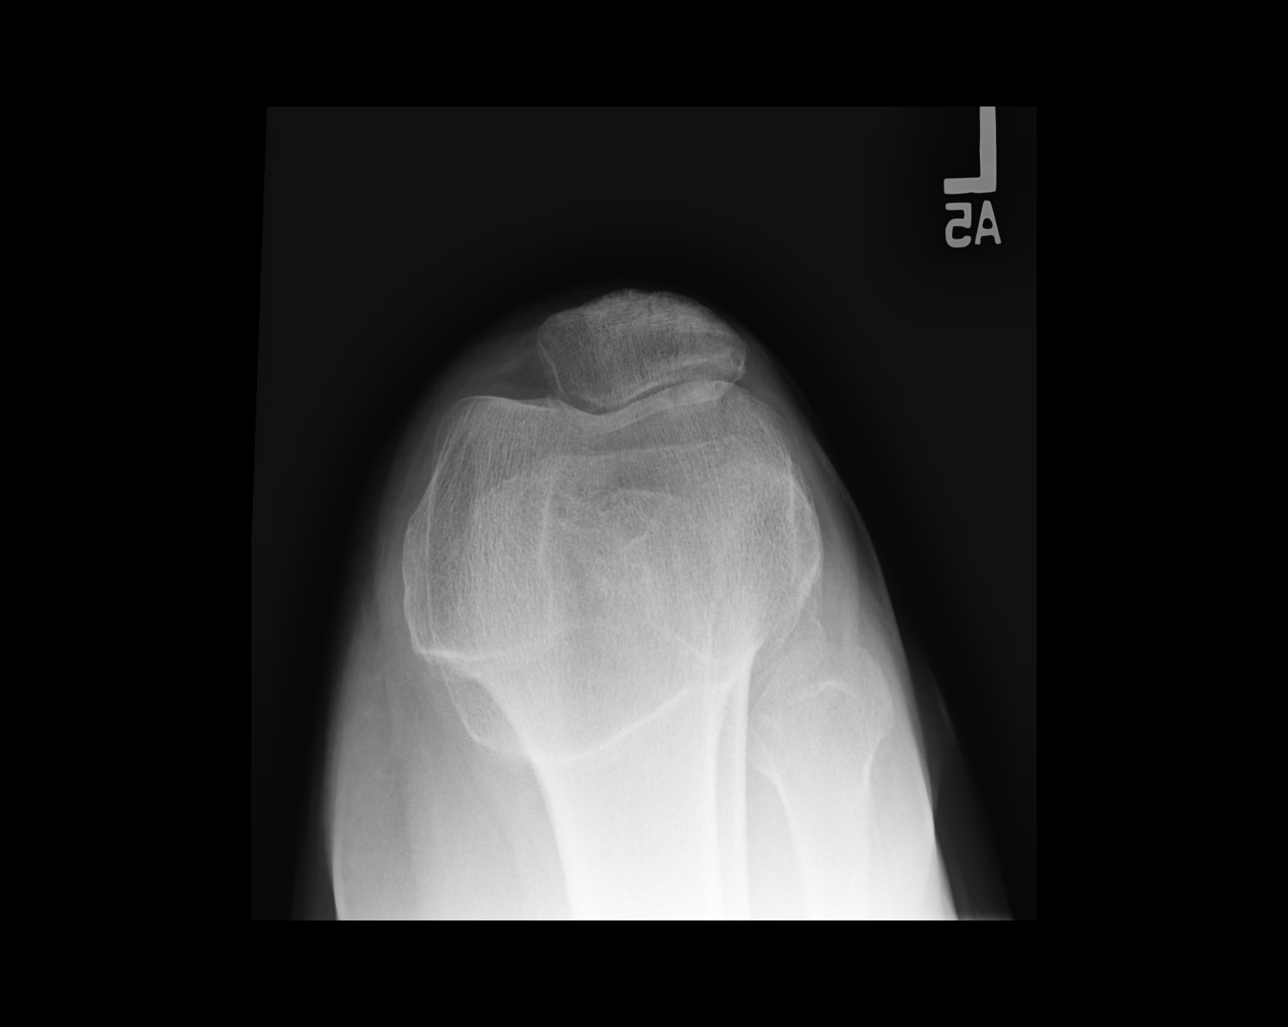

[4 of 4 positions shown; findings below may reference images not displayed]

FINDINGS: No evidence of fracture, dislocation, or joint effusion. No evidence
of arthropathy or other focal bone abnormality. Soft tissues are
unremarkable.
IMPRESSION: Normal left knee.

## 2019-04-04 IMAGING — DX DG KNEE COMPLETE 4+V*R*
4 series · 4 of 4 positions shown · non-contrast
Comparison: None.

CLINICAL DATA: Right knee pain for several months after injury.

EXAM:
RIGHT KNEE - COMPLETE 4+ VIEW

[dg knee complete 4 views right (1 of 4)]
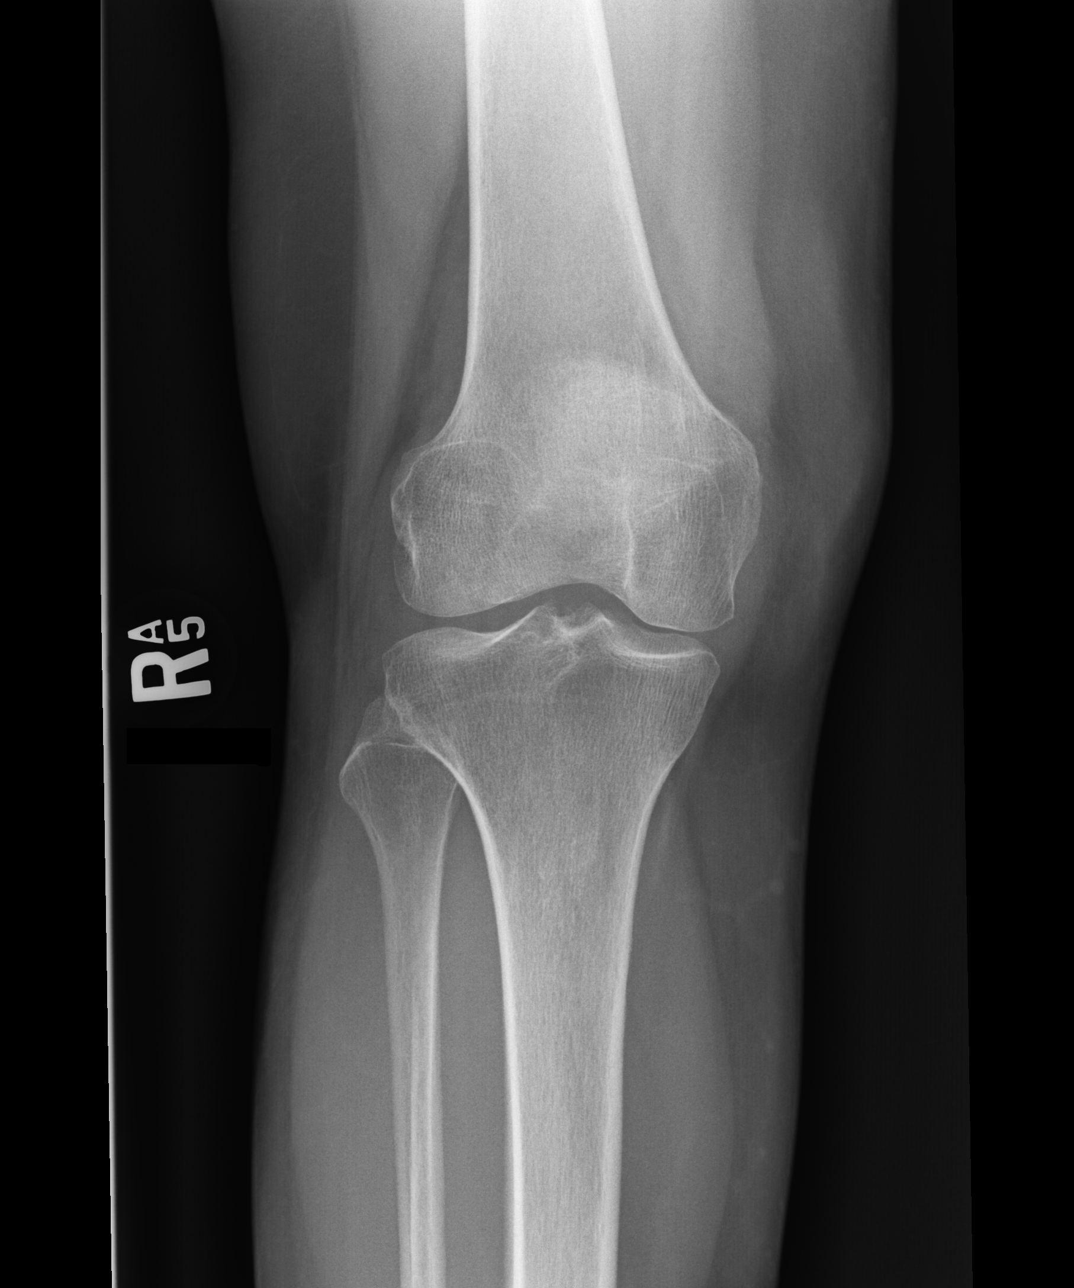

[dg knee complete 4 views right (2 of 4)]
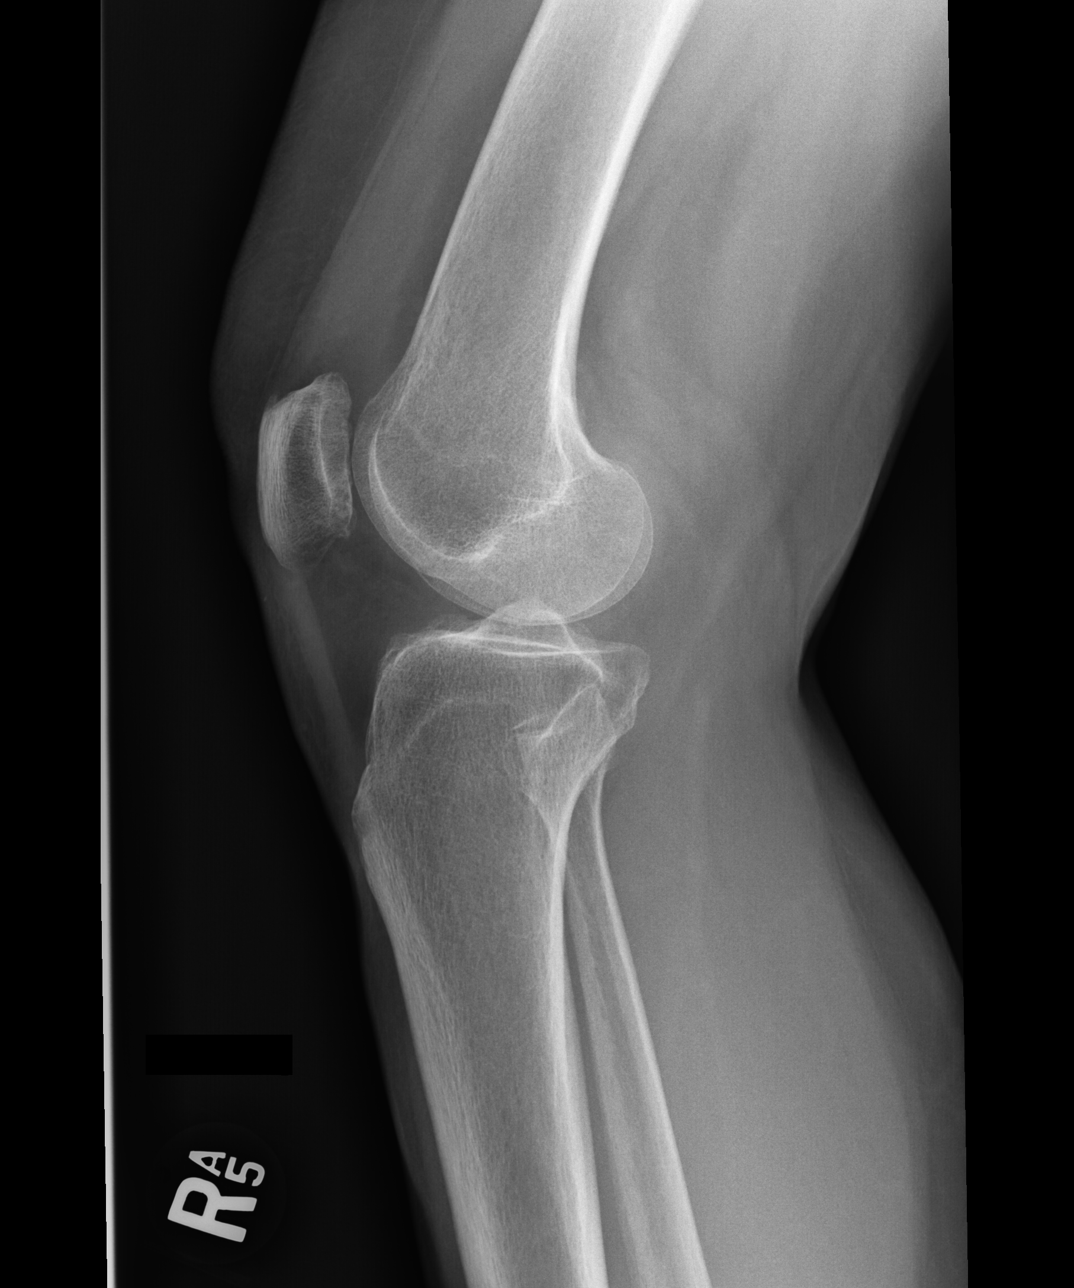

[dg knee complete 4 views right (3 of 4)]
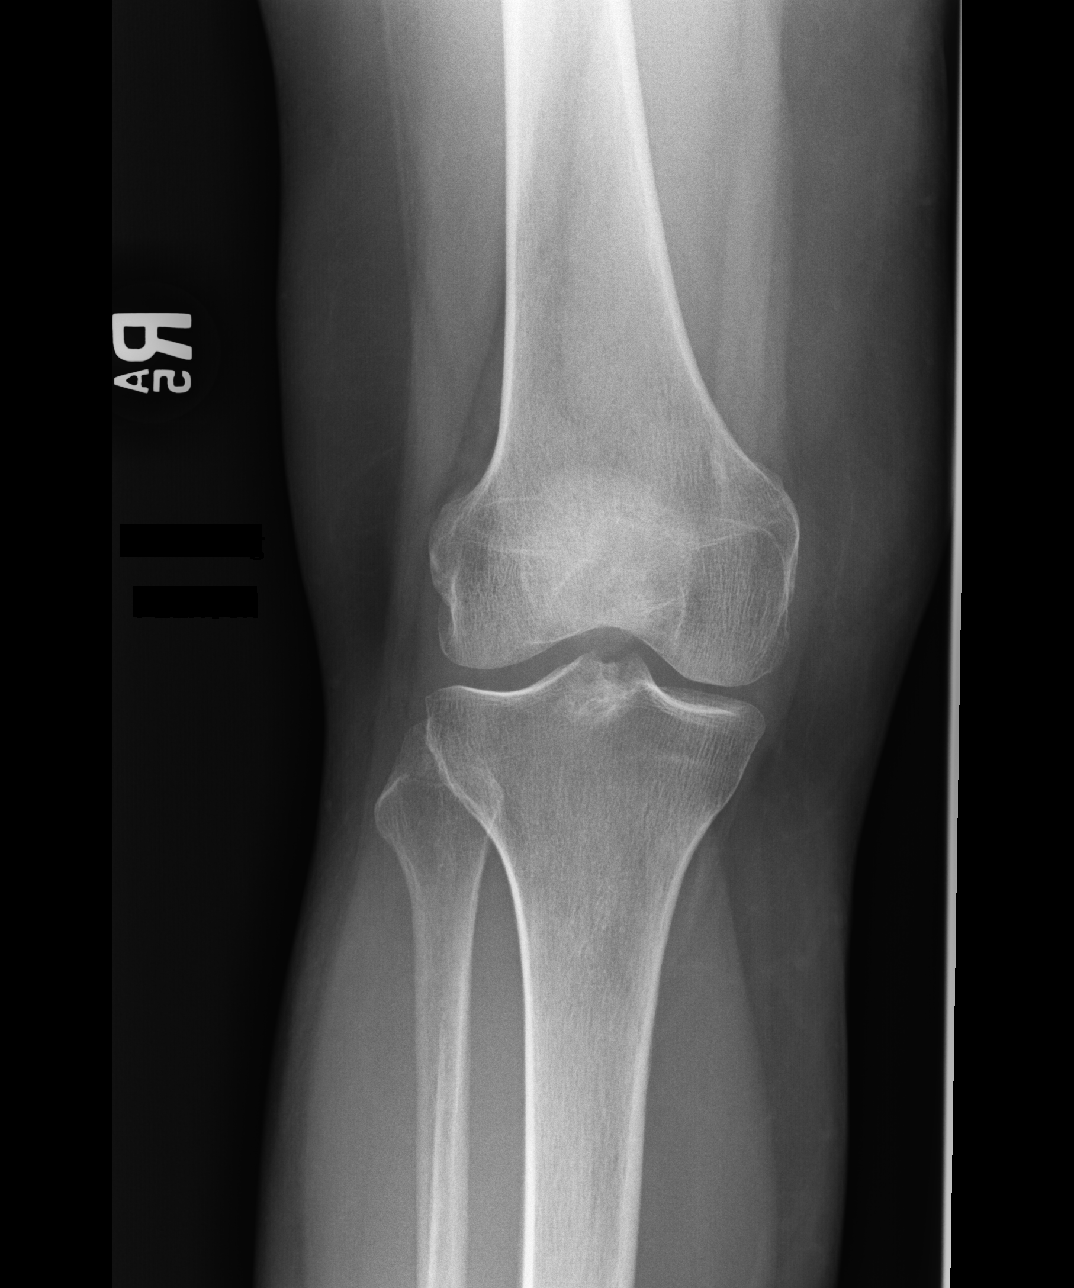

[dg knee complete 4 views right (4 of 4)]
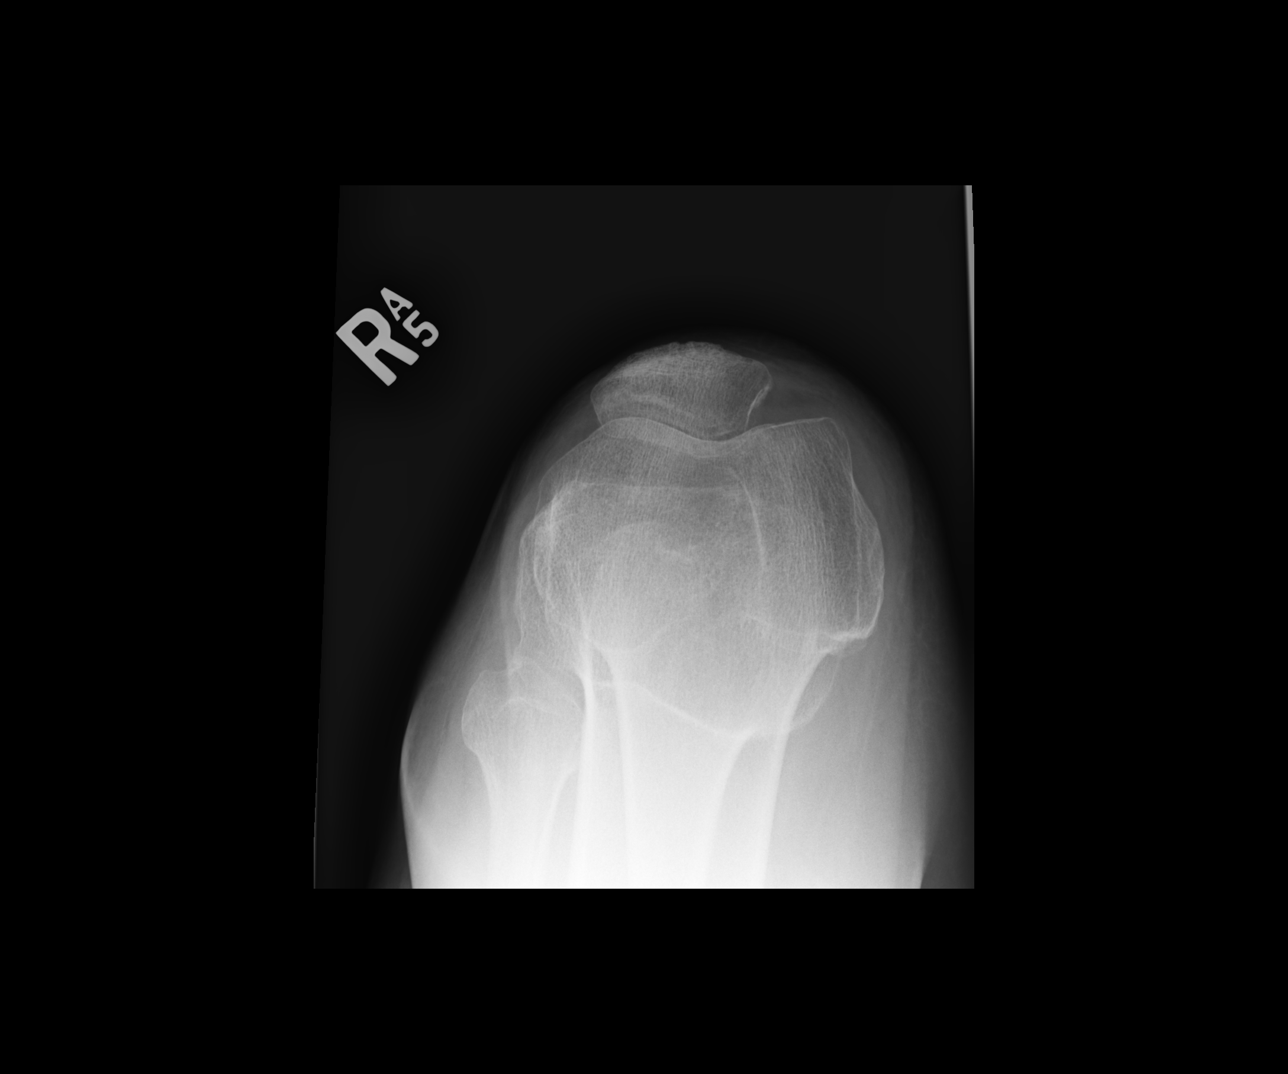

[4 of 4 positions shown; findings below may reference images not displayed]

FINDINGS: No evidence of fracture, dislocation, or joint effusion. No evidence
of arthropathy or other focal bone abnormality. Soft tissues are
unremarkable.
IMPRESSION: Normal right knee.

## 2019-07-08 DIAGNOSIS — H2511 Age-related nuclear cataract, right eye: Secondary | ICD-10-CM | POA: Diagnosis not present

## 2019-08-02 DIAGNOSIS — Z23 Encounter for immunization: Secondary | ICD-10-CM | POA: Diagnosis not present

## 2019-08-07 DIAGNOSIS — Z0000 Encounter for general adult medical examination without abnormal findings: Secondary | ICD-10-CM | POA: Diagnosis not present

## 2019-08-07 DIAGNOSIS — E559 Vitamin D deficiency, unspecified: Secondary | ICD-10-CM | POA: Diagnosis not present

## 2019-08-07 DIAGNOSIS — E039 Hypothyroidism, unspecified: Secondary | ICD-10-CM | POA: Diagnosis not present

## 2019-08-07 DIAGNOSIS — E785 Hyperlipidemia, unspecified: Secondary | ICD-10-CM | POA: Diagnosis not present

## 2019-08-07 DIAGNOSIS — E7800 Pure hypercholesterolemia, unspecified: Secondary | ICD-10-CM | POA: Diagnosis not present

## 2019-08-16 DIAGNOSIS — Z23 Encounter for immunization: Secondary | ICD-10-CM | POA: Diagnosis not present

## 2019-08-16 DIAGNOSIS — Z0000 Encounter for general adult medical examination without abnormal findings: Secondary | ICD-10-CM | POA: Diagnosis not present

## 2019-08-16 DIAGNOSIS — F419 Anxiety disorder, unspecified: Secondary | ICD-10-CM | POA: Diagnosis not present

## 2019-08-16 DIAGNOSIS — E039 Hypothyroidism, unspecified: Secondary | ICD-10-CM | POA: Diagnosis not present

## 2019-08-16 DIAGNOSIS — E7800 Pure hypercholesterolemia, unspecified: Secondary | ICD-10-CM | POA: Diagnosis not present

## 2019-08-16 DIAGNOSIS — E559 Vitamin D deficiency, unspecified: Secondary | ICD-10-CM | POA: Diagnosis not present

## 2019-11-28 DIAGNOSIS — R1031 Right lower quadrant pain: Secondary | ICD-10-CM | POA: Diagnosis not present

## 2019-11-28 DIAGNOSIS — F439 Reaction to severe stress, unspecified: Secondary | ICD-10-CM | POA: Diagnosis not present

## 2019-11-28 DIAGNOSIS — Z800 Family history of malignant neoplasm of digestive organs: Secondary | ICD-10-CM | POA: Diagnosis not present

## 2019-11-28 DIAGNOSIS — Z86010 Personal history of colonic polyps: Secondary | ICD-10-CM | POA: Diagnosis not present

## 2019-12-05 ENCOUNTER — Ambulatory Visit: Payer: BLUE CROSS/BLUE SHIELD | Admitting: Physician Assistant

## 2019-12-11 ENCOUNTER — Ambulatory Visit: Payer: BLUE CROSS/BLUE SHIELD | Admitting: Gastroenterology

## 2019-12-12 ENCOUNTER — Encounter: Payer: Self-pay | Admitting: Nurse Practitioner

## 2019-12-12 ENCOUNTER — Ambulatory Visit (INDEPENDENT_AMBULATORY_CARE_PROVIDER_SITE_OTHER): Payer: Medicare Other | Admitting: Nurse Practitioner

## 2019-12-12 VITALS — Ht 64.75 in | Wt 140.0 lb

## 2019-12-12 DIAGNOSIS — Z8719 Personal history of other diseases of the digestive system: Secondary | ICD-10-CM

## 2019-12-12 DIAGNOSIS — R1031 Right lower quadrant pain: Secondary | ICD-10-CM

## 2019-12-12 NOTE — Patient Instructions (Addendum)
If you are age 68 or older, your body mass index should be between 23-30. Your Body mass index is 23.48 kg/m. If this is out of the aforementioned range listed, please consider follow up with your Primary Care Provider.  If you are age 35 or younger, your body mass index should be between 19-25. Your Body mass index is 23.48 kg/m. If this is out of the aformentioned range listed, please consider follow up with your Primary Care Provider.   CTscan has been recommended.  Patient will call us in a few weeks to get this scheduled.  Thank you for choosing me and [facility].   Tye Savoy, NP

## 2019-12-12 NOTE — Progress Notes (Signed)
Telephonic Visit in setting of [place]  Patient has given consent for a telephonic visit today and understands that is the same as is required for any face-to-face patient encounter except that the service was provided via telephone.   At the time of this telephonic visit the patient was located at home and I, the Provider, at the office of [facility].   Patient was referred to [facility] by Deland Pretty, MD  No one other than the patient and I participated in this telephonic visit today.   Total time of telephonic visit :  32 minutes.   Referring Provider: Deland Pretty, MD Reason for Referral : RLQ pain              ASSESSMENT / PLAN:   [name] is a 69 y.o. female with a pmh significant for, but not necessarily limited to, diverticulosis, colon polyps, hypothyroidism, hyperlipidemia, migraines, anxiety  # RLQ pain -first noticed 3 months ago and initially started in LLQ and severe, now localized to RLQ and just tolerable discomfort not associated with eating or BMs -Etiology of RLQ discomfort unclear. Doesn't sound like diverticulitis but patient doesn't not want to be seen in office during [place] pandemic so not unreasonable to treat empirically with a course of antibiotics. However, her discomfort is clearly at a tolerable level and she prefers to hold on treatment. She hasn't had any abnormal weight loss / fevers or other concerning symptoms. She will call us in a few weeks if pain persists. If so then agrees that testing will probably be necessary. I would recommend CT scan with labs to just renal function first. She inquires about colonoscopy for evaluation of the pain. Explained that colonoscopy is not best test for abdominal pain, especially in absence of other bowel symptoms.   # Hx of colon polyps Surveillance colonoscopy due in 2022.     HPI:     Chief Complaint:  Lower abdominal pain    ** History comes from chart and  patient  [name] is a 69 yo female known to Dr. Ardis Hughs for a history of adenomatous colon polyps. She has been having RLQ but concerned about being seen in office during [place] pandemic. Patient generally avoids foods high in Oxylates but around Thanksgiving really liberalized her diet. She subsequently developed LLQ pain. The pain was significant enough that she didn't feel like getting out of bed. She sometimes struggles with constipation but wasn't constipated at the time. She made some dietary changes, pain eventually relocated to RLQ where it has since remained. She had a Telemedicine visit with PCP 11/28/19 with concerns of anxiety and also the RLQ pain. Lorazepam started. The pain is noticeable on a daily basis, it is not related to movement / activity. No dysuria. Bowels are moving well with prunes. No blood in stool. No N/V. No unintended weight loss    Past Medical History:  Diagnosis Date  . Anxiety   . AV Nodal Reentry Tachycardia    s/p RFCA GT 2/15  . AVNRT (AV nodal re-entry tachycardia) ([place]) 11/06/2013  . Cataract   . H/O radiofrequency ablation for complex left atrial arrhythmia 12/12/2013  . Headache   . Hemorrhoids   . Hyperlipemia   . Hypothyroidism   . Mitral valve prolapse    ?  Marland Kitchen Rectal leakage    1 time     Past Surgical History:  Procedure Laterality Date  . ABLATION  11/21/2013   RFCA of AVNRT by  Dr Lovena Le  . BREAST LUMPECTOMY Left 2000   left breast-  . CATARCT     RIGHT EYE IN 09/2015  . COLONOSCOPY  2007, 2008  . RETINAL DETACHMENT SURGERY     RIGHT EYE/SILICONE BUCKLE  . RHINOPLASTY  1980  . SEPTOPLASTY     1980  . skin lesions     left leg,right leg  . SUPRAVENTRICULAR TACHYCARDIA ABLATION N/A 11/21/2013   Procedure: SUPRAVENTRICULAR TACHYCARDIA ABLATION;  Surgeon: Evans Lance, MD;  Location: [facility] CATH LAB;  Service: Cardiovascular;  Laterality: N/A;   Family History  Problem Relation Age of Onset  . Heart attack Sister 53  . Heart disease  Sister   . Heart attack Father 52  . Heart attack Mother 91  . Pancreatic cancer Mother   . Colon cancer Paternal Uncle 36  . Diabetes Nephew    Social History   Tobacco Use  . Smoking status: Never Smoker  . Smokeless tobacco: Never Used  Substance Use Topics  . Alcohol use: Yes    Alcohol/week: 1.0 standard drinks    Types: 1 Glasses of wine per week    Comment: socially  . Drug use: No   Current Outpatient Medications  Medication Sig Dispense Refill  . acetaminophen (TYLENOL) 500 MG tablet Take 500 mg by mouth every 6 (six) hours as needed.    Marland Kitchen atorvastatin (LIPITOR) 20 MG tablet Take 20 mg by mouth every other day.     . Cholecalciferol (VITAMIN D3) 50 MCG (2000 UT) TABS Take 4,000 Units by mouth daily.     Marland Kitchen FLUoxetine (PROZAC) 20 MG capsule Take 20 mg by mouth daily.    Marland Kitchen levothyroxine (SYNTHROID) 25 MCG tablet Take 25 mcg by mouth daily. Take 1.5 pill daily    . Melatonin 2.5 MG CAPS Take 2.5 mg by mouth at bedtime.     No current facility-administered medications for this visit.   Allergies  Allergen Reactions  . Crestor [Rosuvastatin Calcium] Other (See Comments)    Muscle aches  . Pravastatin     Muscle aches      Review of Systems: All stems reviewed and negative except where noted in HPI.   Creatinine clearance cannot be calculated (Patient's most recent lab result is older than the maximum 21 days allowed.)   Physical Exam:    Not performed  Tye Savoy, NP  12/12/2019, 1:33 PM  Cc:  Referring Provider Deland Pretty, MD

## 2019-12-16 ENCOUNTER — Encounter: Payer: Self-pay | Admitting: Nurse Practitioner

## 2019-12-16 NOTE — Progress Notes (Signed)
I agree with the above note, plan 

## 2019-12-18 ENCOUNTER — Ambulatory Visit: Payer: Medicare Other | Admitting: Cardiology

## 2020-01-06 ENCOUNTER — Other Ambulatory Visit: Payer: Self-pay

## 2020-01-06 ENCOUNTER — Telehealth: Payer: Self-pay | Admitting: Nurse Practitioner

## 2020-01-06 DIAGNOSIS — R1031 Right lower quadrant pain: Secondary | ICD-10-CM

## 2020-01-06 NOTE — Telephone Encounter (Signed)
Sure, we can check a UA. Also, BMET and CT scan  abd/ pelvis with contrast for RLQ pain. Thanks

## 2020-01-06 NOTE — Telephone Encounter (Signed)
Spoke with the patient. Her symptoms are persistent and she would like to go forward with the plan discussed.  She agrees to the CT imaging and the needed renal functions for the contrast.  She is asking if a "urine test" can be done. She does not have dysuria or urinary urgency, but would like to r/o UTI. Please advise.

## 2020-01-07 ENCOUNTER — Other Ambulatory Visit: Payer: Self-pay

## 2020-01-07 NOTE — Telephone Encounter (Signed)
Patient returned your call, please call patient one more time. She will not be available between 1:30pm to 3:00pm.

## 2020-01-07 NOTE — Telephone Encounter (Signed)
Orders for the lab are in.  CT appointment is on 01/16/20 at [place]. Pick up the contrast from [name] when she comes in for labs.

## 2020-01-07 NOTE — Telephone Encounter (Signed)
Discussed the contrast with the patient. She is unsure she can tolerate the oral contrast so early in the morning. Moved her CT imaging to later in the day. She will have the CT abd/pelvis at 3:30pm in [facility] on 01/16/20. Instructions will be provided with the contrast.  Radiology made aware of the patient's anxiety (pt requested) and of her family history of reaction to radiology contrast via IV. She does not know for certain which tests were being done at the time of the reactions. She does advise the reactions were very severe and both relatives had colon issues.

## 2020-01-09 ENCOUNTER — Other Ambulatory Visit (INDEPENDENT_AMBULATORY_CARE_PROVIDER_SITE_OTHER): Payer: Medicare Other

## 2020-01-09 DIAGNOSIS — R1031 Right lower quadrant pain: Secondary | ICD-10-CM | POA: Diagnosis not present

## 2020-01-09 LAB — BASIC METABOLIC PANEL
BUN: 18 mg/dL (ref 6–23)
CO2: 27 mEq/L (ref 19–32)
Calcium: 9 mg/dL (ref 8.4–10.5)
Chloride: 98 mEq/L (ref 96–112)
Creatinine, Ser: 0.74 mg/dL (ref 0.40–1.20)
GFR: 78.2 mL/min (ref >60.00)
Glucose, Bld: 90 mg/dL (ref 70–99)
Potassium: 4.1 mEq/L (ref 3.5–5.1)
Sodium: 131 mEq/L — ABNORMAL LOW (ref 135–145)

## 2020-01-10 LAB — URINALYSIS W MICROSCOPIC + REFLEX CULTURE
Bacteria, UA: NONE SEEN /HPF
Bilirubin Urine: NEGATIVE
Glucose, UA: NEGATIVE
Hgb urine dipstick: NEGATIVE
Hyaline Cast: NONE SEEN /LPF
Ketones, ur: NEGATIVE
Leukocyte Esterase: NEGATIVE
Nitrites, Initial: NEGATIVE
Protein, ur: NEGATIVE
RBC / HPF: NONE SEEN /HPF (ref 0–2)
Specific Gravity, Urine: 1.005 (ref 1.001–1.03)
Squamous Epithelial / LPF: NONE SEEN /HPF (ref <=5)
WBC, UA: NONE SEEN /HPF (ref 0–5)
pH: 7 (ref 5.0–8.0)

## 2020-01-10 LAB — NO CULTURE INDICATED

## 2020-01-14 ENCOUNTER — Telehealth: Payer: Self-pay

## 2020-01-14 NOTE — Telephone Encounter (Signed)
If her PCP feels it is cardiac pain, I am happy to see her back. It appears to be muscular.  JG

## 2020-01-14 NOTE — Telephone Encounter (Signed)
VM 106 : Patient called and left a message stating that her Annual Physical is coming up this week and she has been having a slight muscular pulse in her left arm. She is asking, is this something that she should be concerned about because she does have a family hx of Heart Attacks. Please advise.

## 2020-01-15 NOTE — Telephone Encounter (Signed)
Patient will just wait for appt on 02/28/2020.

## 2020-01-16 ENCOUNTER — Other Ambulatory Visit: Payer: Self-pay

## 2020-01-16 ENCOUNTER — Ambulatory Visit (HOSPITAL_COMMUNITY)
Admission: RE | Admit: 2020-01-16 | Discharge: 2020-01-16 | Disposition: A | Discharge status: Home / Self Care | Payer: Medicare Other | Source: Ambulatory Visit | Attending: Nurse Practitioner | Admitting: Nurse Practitioner

## 2020-01-16 DIAGNOSIS — R1031 Right lower quadrant pain: Secondary | ICD-10-CM | POA: Insufficient documentation

## 2020-01-16 DIAGNOSIS — K5790 Diverticulosis of intestine, part unspecified, without perforation or abscess without bleeding: Secondary | ICD-10-CM

## 2020-01-16 DIAGNOSIS — K5730 Diverticulosis of large intestine without perforation or abscess without bleeding: Secondary | ICD-10-CM | POA: Diagnosis not present

## 2020-01-16 HISTORY — DX: Diverticulosis of intestine, part unspecified, without perforation or abscess without bleeding: K57.90

## 2020-01-16 MED ORDER — IOHEXOL 300 MG/ML  SOLN
100.0000 mL | Freq: Once | INTRAMUSCULAR | Status: AC | PRN
  Administered 2020-01-16: 17:00:00 100 mL via INTRAVENOUS

## 2020-01-16 MED ORDER — SODIUM CHLORIDE (PF) 0.9 % IJ SOLN
INTRAMUSCULAR | Status: AC
  Filled 2020-01-16: qty 50

## 2020-01-24 ENCOUNTER — Telehealth: Payer: Self-pay | Admitting: Nurse Practitioner

## 2020-01-27 NOTE — Telephone Encounter (Signed)
Beth, I  commented on CT scan several days ago through my chart. Result are on the report. Did I do something incorrectly so that she wasn't able to see what I said? Thanks

## 2020-01-27 NOTE — Telephone Encounter (Signed)
It did not show in the chart. I cannot see it either. If you want to send it to me, I can review your recommendations with her. Thanks

## 2020-01-27 NOTE — Telephone Encounter (Signed)
Results are in [name]. She has My Chart that she appears to utilize.

## 2020-01-29 NOTE — Telephone Encounter (Signed)
Spoke with the patient. She will check her messages in the My Chart. Suggestion to check her notification settings. We reviewed the imaging results in detail over the phone. Discussed in detail what diverticulosis is. Also per her request we have forwarded copies to her PCP and cardiologist. Very nice lady.

## 2020-02-21 ENCOUNTER — Telehealth: Payer: Self-pay

## 2020-02-21 NOTE — Telephone Encounter (Signed)
Pt called wanting to make sure you received results from her CT scan. Wants to review them with you. They were supposed to be sent over before her appt with you next Friday.

## 2020-02-27 NOTE — Progress Notes (Signed)
Primary Physician/Referring:  [name], FNP  Patient ID: [name], female    DOB: 01-07-1951, 69 y.o.   MRN: [id number]  Chief Complaint  Patient presents with  . Follow-up  . Hyperlipidemia  . Follow-up    1 year  . Dizziness  . Palpitations   HPI:    [name]  is a 69 y.o. female  with hyperlipidemia, hypothyroidism, history of migraines, family history of early CAD, prior second hand tobacco, history of AVRNT s/p ablation with Dr. Lovena Le in 2015, referred to [name] for evaluation chest pain. Was seen by [name] on 11/06/18.    She recently had CT of the abdomen which had shown cardiomegaly, patient made an appointment to see [name] to discuss further regarding cardiomegaly, concerns about heart failure and also in view of premature coronary artery disease wanted to discuss with me.  Except for mild chronic dyspnea, no specific symptoms.  She has been active and has maintained weight loss.  Past Medical History:  Diagnosis Date  . Anxiety   . AV Nodal Reentry Tachycardia    s/p RFCA GT 2/15  . AVNRT (AV nodal re-entry tachycardia) ([place]) 11/06/2013  . Cataract   . Diverticulosis 01/2020  . H/O radiofrequency ablation for complex left atrial arrhythmia 12/12/2013  . Headache   . Hemorrhoids   . Hyperlipemia   . Hypothyroidism   . Mitral valve prolapse    ?  Marland Kitchen Rectal leakage    1 time   Past Surgical History:  Procedure Laterality Date  . ABLATION  11/21/2013   RFCA of AVNRT by Dr Lovena Le  . BREAST LUMPECTOMY Left 2000   left breast-  . CATARCT     RIGHT EYE IN 09/2015  . COLONOSCOPY  2007, 2008  . RETINAL DETACHMENT SURGERY     RIGHT EYE/SILICONE BUCKLE  . RHINOPLASTY  1980  . SEPTOPLASTY     1980  . skin lesions     left leg,right leg  . SUPRAVENTRICULAR TACHYCARDIA ABLATION N/A 11/21/2013   Procedure: SUPRAVENTRICULAR TACHYCARDIA ABLATION;  Surgeon: Evans Lance, MD;  Location: [facility] CATH LAB;  Service: Cardiovascular;  Laterality: N/A;   Family History   Problem Relation Age of Onset  . Heart attack Sister 5  . Heart disease Sister   . Heart attack Father 57  . Heart attack Mother 33  . Pancreatic cancer Mother   . Colon cancer Paternal Uncle 61  . Diabetes Nephew     Social History   Tobacco Use  . Smoking status: Never Smoker  . Smokeless tobacco: Never Used  Substance Use Topics  . Alcohol use: Yes    Alcohol/week: 1.0 standard drinks    Types: 1 Glasses of wine per week    Comment: socially   Marital Status: Widowed  ROS  Review of Systems  Cardiovascular: Negative for dyspnea on exertion, leg swelling and syncope.  Respiratory: Positive for shortness of breath (very mild).   Gastrointestinal: Negative for melena.   Objective  Blood pressure 126/85, pulse 77, temperature 97.8 F (36.6 C), temperature source Oral, resp. rate 15, height '5\' 4"'  (1.626 m), weight 142 lb (64.4 kg), SpO2 100 %.  Vitals with BMI 02/28/2020 12/12/2019 12/12/2018  Height '5\' 4"'  5' 4.75" '5\' 5"'   Weight 142 lbs 140 lbs 143 lbs 11 oz  BMI 24.36 83.25 49.82  Systolic 641 - 583  Diastolic 85 - 84  Pulse 77 - 81     Physical Exam  Constitutional: She  appears well-developed and well-nourished. No distress.  Cardiovascular: Normal rate, regular rhythm and intact distal pulses. Exam reveals no gallop.  No murmur heard. No leg edema. No JVD.    Pulmonary/Chest: Effort normal and breath sounds normal. No accessory muscle usage. No respiratory distress.  Abdominal: Soft.   Laboratory examination:   Recent Labs    01/09/20 1325  NA 131*  K 4.1  CL 98  CO2 27  GLUCOSE 90  BUN 18  CREATININE 0.74  CALCIUM 9.0   CrCl cannot be calculated (Patient's most recent lab result is older than the maximum 21 days allowed.).  CMP Latest Ref Rng & Units 01/09/2020 11/21/2013 11/06/2013  Glucose 70 - 99 mg/dL 90 108(H) 101(H)  BUN 6 - 23 mg/dL '18 13 17  ' Creatinine 0.40 - 1.20 mg/dL 0.74 0.71 0.74  Sodium 135 - 145 mEq/L 131(L) 143 139  Potassium 3.5 - 5.1  mEq/L 4.1 3.9 4.3  Chloride 96 - 112 mEq/L 98 107 103  CO2 19 - 32 mEq/L '27 23 21  ' Calcium 8.4 - 10.5 mg/dL 9.0 8.9 9.0   CBC Latest Ref Rng & Units 11/21/2013 11/06/2013 12/29/2010  WBC 4.0 - 10.5 K/uL 4.8 7.5 -  Hemoglobin 12.0 - 15.0 g/dL 11.9(L) 12.4 11.9(L)  Hematocrit 36.0 - 46.0 % 35.6(L) 36.6 35.0(L)  Platelets 150 - 400 K/uL 221 230 -   Lipid Panel     Component Value Date/Time   CHOL 290 (H) 01/18/2012 0910   TRIG 127.0 01/18/2012 0910   HDL 54.40 01/18/2012 0910   CHOLHDL 5 01/18/2012 0910   VLDL 25.4 01/18/2012 0910   LDLDIRECT 209.4 01/18/2012 0910   HEMOGLOBIN A1C No results found for: HGBA1C, MPG TSH No results for input(s): TSH in the last 8760 hours.  External labs:   Glucose Random 90.000 01/09/2020  BUN 18.000 01/09/2020 Creatinine, Serum 0.740 01/09/2020  Lipid Panel  08/07/2019 Cholesterol, total 205.000 M 08/07/2019 Triglycerides 66.000 08/07/2019 HDL 73.000 08/07/2019 LDL-C 109.000 M 10/09/2019  TSH 3.110 08/07/2019  10/11/2018: LDL-P 1080. Potassium 4.3, Creatinine 0.7, eGFR 81/93, CMP normal. Cholesterol 194, triglycerides 76, HDL 70, LDL 119.NHDL at goal at 122.  Vit D normal. RBC 4.15, Hct 36.1.  Medications and allergies   Allergies  Allergen Reactions  . Crestor [Rosuvastatin Calcium] Other (See Comments)    Muscle aches  . Pravastatin     Muscle aches      Current Outpatient Medications  Medication Instructions  . acetaminophen (TYLENOL) 500 mg, Oral, Every 6 hours PRN  . atorvastatin (LIPITOR) 20 mg, Oral, Every other day  . FLUoxetine (PROZAC) 20 mg, Oral, Daily  . LORazepam (ATIVAN) 0.5 mg, Oral, 2 times daily PRN  . Melatonin 2.5 mg, Oral, Daily at bedtime  . Synthroid 25 mcg, Oral, Daily, Take 1.5 pill daily  . Vitamin D3 4,000 Units, Oral, Daily   Radiology:   CT calcium score 11/09/2018: Calcium score 0.  CT angiogram  Abdomen Pelvis 01/16/2020 1. No abnormality to explain the patient's right lower quadrant abdominal  pain. 2. Pancolonic diverticulosis without CT evidence for diverticulitis. 3. Normal appendix in the right lower quadrant. 4. Cardiomegaly.  Cardiac Studies:   GXT 07/26/2016: The patient exercised following the Bruce protocol. The patient reported no symptoms during the stress test. The patient experienced no angina during the stress test. The patient requested the test to be stopped. Blood pressure and heart rate demonstrated a normal response to exercise. Blood pressure demonstrated a normal response to exercise. Overall, the patient's  exercise capacity was normal. 85% of maximum heart rate was achieved after 6.2 minutes. Recovery time: 5 minutes. The patient's response to exercise was adequate for diagnosis. Response to Stress There was no ST segment deviation noted during stress. Arrhythmias during stress: rare PVCs. Arrhythmias during recovery: none. Arrhythmias were not significant. ECG was interpretable and there was no significant change from baseline.  Echocardiogram 11/14/2018: Left ventricle cavity is normal in size. Normal left ventricular shape. Low normal LV systolic function. Doppler evidence of grade I (impaired) diastolic dysfunction, normal LAP. Left ventricle regional wall motion findings: No wall motion abnormalities. Visual EF is 50-55%. Calculated EF 47%. Left atrial cavity is mildly dilated at 4.2 cm. Mild aortic valve leaflet thickening. Trileaflet aortic valve with no regurgitation noted. Mild mitral valve leaflet thickening. Mild (Grade I) mitral regurgitation. No prolapse noted. No evidence of significant pericardial effusion. Anterior fat pad noted.  EKG  EKG 02/28/2020: Normal sinus rhythm at rate of 70 bpm, normal axis.  Poor R wave progression, probably normal variant.  No evidence of ischemia.  Nonspecific T abnormality.  Borderline low voltage complexes.   No significant change from  11/06/2018: Normal sinus rhythm at 74 bpm with first-degree AV block,  normal axis, no evidence of ischemia.  Low-voltage complexes.  Assessment     ICD-10-CM   1. Palpitations  R00.2 EKG 12-Lead  2. H/O radiofrequency ablation for complex left atrial arrhythmia  Z98.890    Z86.79   3. Dizziness  R42   4. Family history of premature CAD  Z82.49   5. Hypercholesteremia  E78.00      No orders of the defined types were placed in this encounter.   There are no discontinued medications.  Recommendations:   [name]  is a 69 y.o. female  with hyperlipidemia, hypothyroidism, history of migraines, family history of early CAD, prior second hand tobacco, history of AVRNT s/p ablation with Dr. Lovena Le in 2015, referred to [name] for evaluation chest pain. Was seen by [name] on 11/06/18.    She recently had CT of the abdomen which had shown cardiomegaly, patient made an appointment to see [name] to discuss further regarding cardiomegaly.  She is also concerned about premature coronary artery disease and hyperlipidemia.  I have reviewed the CT angiogram of the abdomen report, patient concerned about cardiomegaly.  She has normal echocardiogram, she probably has mild mitral prolapse and a mid systolic click that is well appreciated but no significant mitral regurgitation, only mild by echocardiogram.  I reassured her.  With regard to hyperlipidemia, non-HDL cholesterol is at goal.  In view of strong family still premature coronary disease would recommend continued aggressive therapy and keep LDL to less than 100.  Non-HDL cholesterol goal is <130.  The lower the better.  Symptoms of palpitations that are occurring at night are clearly indicative of PVCs that are occurring at night and may be related to GERD, she could try over-the-counter PPI.  She also has very mild obstructive sleep apnea not enough to qualify for CPAP and this may be contributing to her PVCs.  With regard to dizziness, sudden onset dizziness when she is standing for a prolonged period of time could be just  venous pooling, there is no frank syncope, EKG does not reveal any high degree AV block or fascicular block, hence advised her to to not stand in one place for long time and also counterpressure maneuvers discussed.  I have again reviewed her coronary calcium score which are 0 in  the past and also recently performed abdominal CT shows no evidence of aortic atherosclerosis.  These are all good signs.  I will see her back on a as needed basis.  40-minute office visit encounter in review of external records and test results.   Adrian Prows, MD, [facility], [place] 02/29/2020, 3:10 PM [facility] [place] Pager: [phone] Office: [phone]

## 2020-02-28 ENCOUNTER — Ambulatory Visit: Payer: Medicare Other | Admitting: Cardiology

## 2020-02-28 ENCOUNTER — Other Ambulatory Visit: Payer: Self-pay

## 2020-02-28 ENCOUNTER — Encounter: Payer: Self-pay | Admitting: Cardiology

## 2020-02-28 VITALS — BP 126/85 | HR 77 | Temp 97.8°F | Resp 15 | Ht 64.0 in | Wt 142.0 lb

## 2020-02-28 DIAGNOSIS — Z8679 Personal history of other diseases of the circulatory system: Secondary | ICD-10-CM

## 2020-02-28 DIAGNOSIS — R42 Dizziness and giddiness: Secondary | ICD-10-CM | POA: Diagnosis not present

## 2020-02-28 DIAGNOSIS — Z9889 Other specified postprocedural states: Secondary | ICD-10-CM | POA: Diagnosis not present

## 2020-02-28 DIAGNOSIS — E7800 Pure hypercholesterolemia, unspecified: Secondary | ICD-10-CM | POA: Diagnosis not present

## 2020-02-28 DIAGNOSIS — R002 Palpitations: Secondary | ICD-10-CM

## 2020-02-28 DIAGNOSIS — Z8249 Family history of ischemic heart disease and other diseases of the circulatory system: Secondary | ICD-10-CM | POA: Diagnosis not present

## 2020-05-15 DIAGNOSIS — B351 Tinea unguium: Secondary | ICD-10-CM | POA: Diagnosis not present

## 2020-05-15 DIAGNOSIS — D1801 Hemangioma of skin and subcutaneous tissue: Secondary | ICD-10-CM | POA: Diagnosis not present

## 2020-05-15 DIAGNOSIS — L718 Other rosacea: Secondary | ICD-10-CM | POA: Diagnosis not present

## 2020-05-15 DIAGNOSIS — L821 Other seborrheic keratosis: Secondary | ICD-10-CM | POA: Diagnosis not present

## 2020-05-15 DIAGNOSIS — D225 Melanocytic nevi of trunk: Secondary | ICD-10-CM | POA: Diagnosis not present

## 2020-05-15 DIAGNOSIS — D2262 Melanocytic nevi of left upper limb, including shoulder: Secondary | ICD-10-CM | POA: Diagnosis not present

## 2020-05-15 DIAGNOSIS — B354 Tinea corporis: Secondary | ICD-10-CM | POA: Diagnosis not present

## 2020-05-15 DIAGNOSIS — L814 Other melanin hyperpigmentation: Secondary | ICD-10-CM | POA: Diagnosis not present

## 2020-05-27 DIAGNOSIS — E7800 Pure hypercholesterolemia, unspecified: Secondary | ICD-10-CM | POA: Diagnosis not present

## 2020-05-28 DIAGNOSIS — Z1231 Encounter for screening mammogram for malignant neoplasm of breast: Secondary | ICD-10-CM | POA: Diagnosis not present

## 2020-07-07 DIAGNOSIS — H26491 Other secondary cataract, right eye: Secondary | ICD-10-CM | POA: Diagnosis not present

## 2020-08-26 DIAGNOSIS — Z0000 Encounter for general adult medical examination without abnormal findings: Secondary | ICD-10-CM | POA: Diagnosis not present

## 2020-08-26 DIAGNOSIS — M8580 Other specified disorders of bone density and structure, unspecified site: Secondary | ICD-10-CM | POA: Diagnosis not present

## 2020-08-26 DIAGNOSIS — E039 Hypothyroidism, unspecified: Secondary | ICD-10-CM | POA: Diagnosis not present

## 2020-08-26 DIAGNOSIS — E785 Hyperlipidemia, unspecified: Secondary | ICD-10-CM | POA: Diagnosis not present

## 2020-08-26 DIAGNOSIS — E559 Vitamin D deficiency, unspecified: Secondary | ICD-10-CM | POA: Diagnosis not present

## 2020-08-26 DIAGNOSIS — N390 Urinary tract infection, site not specified: Secondary | ICD-10-CM | POA: Diagnosis not present

## 2020-08-26 DIAGNOSIS — E7800 Pure hypercholesterolemia, unspecified: Secondary | ICD-10-CM | POA: Diagnosis not present

## 2020-09-07 DIAGNOSIS — K219 Gastro-esophageal reflux disease without esophagitis: Secondary | ICD-10-CM | POA: Diagnosis not present

## 2020-09-07 DIAGNOSIS — J309 Allergic rhinitis, unspecified: Secondary | ICD-10-CM | POA: Diagnosis not present

## 2020-09-07 DIAGNOSIS — R030 Elevated blood-pressure reading, without diagnosis of hypertension: Secondary | ICD-10-CM | POA: Diagnosis not present

## 2020-09-07 DIAGNOSIS — F419 Anxiety disorder, unspecified: Secondary | ICD-10-CM | POA: Diagnosis not present

## 2020-09-07 DIAGNOSIS — N390 Urinary tract infection, site not specified: Secondary | ICD-10-CM | POA: Diagnosis not present

## 2020-09-22 ENCOUNTER — Ambulatory Visit: Payer: Medicare Other | Admitting: Cardiology

## 2020-09-22 ENCOUNTER — Encounter: Payer: Self-pay | Admitting: Cardiology

## 2020-09-22 ENCOUNTER — Other Ambulatory Visit: Payer: Self-pay

## 2020-09-22 VITALS — BP 140/92 | HR 81 | Resp 16 | Ht 64.0 in | Wt 143.6 lb

## 2020-09-22 DIAGNOSIS — R030 Elevated blood-pressure reading, without diagnosis of hypertension: Secondary | ICD-10-CM

## 2020-09-22 DIAGNOSIS — R002 Palpitations: Secondary | ICD-10-CM | POA: Diagnosis not present

## 2020-09-22 NOTE — Progress Notes (Signed)
Primary Physician/Referring:  [name], FNP  Patient ID: [name], female    DOB: 02/21/1951, 69 y.o.   MRN: [id number]  Chief Complaint  Patient presents with  . Palpitations  . Hypertension  . Follow-up    Referred by Dr. Shelia Media   HPI:    [name]  is a 69 y.o. female  with hyperlipidemia, hypothyroidism, history of migraines, family history of early CAD, prior second hand tobacco, history of AVRNT s/p ablation with Dr. Lovena Le in 2015, referred back to [name] for evaluation of elevated blood pressure and also palpitations. I last seen her 6 months ago and made her visits as needed.  Patient states that since ablation, she continues to have episodes of palpitations which she describes it as awareness of her heartbeat especially at night when she is resting. She has not had any further episodes of palpitation similar to PSVT. She does not feel them at any other times.  She was also told to have elevated blood pressure by her PCP. She has been recording blood pressure at home and all blood pressure recordings have been very normal. There are been episodes where she felt dizzy and the blood pressure has even been low around 100/60 mmHg. She has not had any syncope or near syncope.  She recently had CT of the abdomen which had shown cardiomegaly, patient made an appointment to see [name] to discuss further regarding cardiomegaly, concerns about heart failure and also in view of premature coronary artery disease wanted to discuss with me.  Except for mild chronic dyspnea, no specific symptoms.  She has been active and has maintained weight loss.  Past Medical History:  Diagnosis Date  . Anxiety   . AV Nodal Reentry Tachycardia    s/p RFCA GT 2/15  . AVNRT (AV nodal re-entry tachycardia) ([place]) 11/06/2013  . Cataract   . Diverticulosis 01/2020  . H/O radiofrequency ablation for complex left atrial arrhythmia 12/12/2013  . Headache   . Hemorrhoids   . Hyperlipemia   .  Hypothyroidism   . Mitral valve prolapse    ?  Marland Kitchen Rectal leakage    1 time   Past Surgical History:  Procedure Laterality Date  . ABLATION  11/21/2013   RFCA of AVNRT by Dr Lovena Le  . BREAST LUMPECTOMY Left 2000   left breast-  . CATARCT     RIGHT EYE IN 09/2015  . COLONOSCOPY  2007, 2008  . RETINAL DETACHMENT SURGERY     RIGHT EYE/SILICONE BUCKLE  . RHINOPLASTY  1980  . SEPTOPLASTY     1980  . skin lesions     left leg,right leg  . SUPRAVENTRICULAR TACHYCARDIA ABLATION N/A 11/21/2013   Procedure: SUPRAVENTRICULAR TACHYCARDIA ABLATION;  Surgeon: Evans Lance, MD;  Location: [facility] CATH LAB;  Service: Cardiovascular;  Laterality: N/A;   Family History  Problem Relation Age of Onset  . Heart attack Sister 60  . Heart disease Sister   . Heart attack Father 30  . Heart attack Mother 37  . Pancreatic cancer Mother   . Colon cancer Paternal Uncle 57  . Diabetes Nephew     Social History   Tobacco Use  . Smoking status: Never Smoker  . Smokeless tobacco: Never Used  Substance Use Topics  . Alcohol use: Yes    Alcohol/week: 1.0 standard drink    Types: 1 Glasses of wine per week    Comment: socially   Marital Status: Widowed  ROS  Review  of Systems  Cardiovascular: Positive for palpitations (occasional at night). Negative for dyspnea on exertion, leg swelling and syncope.  Respiratory: Positive for shortness of breath (very mild).   Gastrointestinal: Negative for melena.   Objective  Blood pressure (!) 141/87, pulse 81, resp. rate 16, height '5\' 4"'  (1.626 m), weight 143 lb 9.6 oz (65.1 kg), SpO2 100 %.  Vitals with BMI 09/22/2020 02/28/2020 12/12/2019  Height '5\' 4"'  '5\' 4"'  5' 4.75"  Weight 143 lbs 10 oz 142 lbs 140 lbs  BMI 24.64 36.14 43.15  Systolic 400 867 -  Diastolic 87 85 -  Pulse 81 77 -     Physical Exam Constitutional:      General: She is not in acute distress.    Appearance: She is well-developed.  Cardiovascular:     Rate and Rhythm: Normal rate and regular  rhythm.     Pulses: Intact distal pulses.     Heart sounds: No murmur heard.  No gallop.      Comments: No leg edema. No JVD.   Pulmonary:     Effort: Pulmonary effort is normal. No accessory muscle usage or respiratory distress.     Breath sounds: Normal breath sounds.  Abdominal:     Palpations: Abdomen is soft.    Laboratory examination:   Recent Labs    01/09/20 1325  NA 131*  K 4.1  CL 98  CO2 27  GLUCOSE 90  BUN 18  CREATININE 0.74  CALCIUM 9.0    External labs:   Labs 09/07/2020:  Hb 12.3/HCT 37.8, platelets 220, normal indicis.  Sodium 142, serum glucose 91 mg, BUN 17, creatinine 0.9, EGFR 66 mL, CMP otherwise normal.  Total cholesterol 212, triglycerides 48, HDL 76, LDL 117.  Non-HDL cholesterol 127.  TSH normal at 3.22.  Glucose Random 90.000 01/09/2020  BUN 18.000 01/09/2020 Creatinine, Serum 0.740 01/09/2020  Lipid Panel  08/07/2019 Cholesterol, total 205.000 M 08/07/2019 Triglycerides 66.000 08/07/2019 HDL 73.000 08/07/2019 LDL-C 109.000 M 10/09/2019  TSH 3.110 08/07/2019  10/11/2018: LDL-P 1080. Potassium 4.3, Creatinine 0.7, eGFR 81/93, CMP normal. Cholesterol 194, triglycerides 76, HDL 70, LDL 119.NHDL at goal at 122.  Vit D normal. RBC 4.15, Hct 36.1.  Medications and allergies   Allergies  Allergen Reactions  . Crestor [Rosuvastatin Calcium] Other (See Comments)    Muscle aches  . Pravastatin     Muscle aches      Current Outpatient Medications  Medication Instructions  . acetaminophen (TYLENOL) 500 mg, Oral, Every 6 hours PRN  . atorvastatin (LIPITOR) 20 mg, Oral, Every other day  . FLUoxetine (PROZAC) 40 mg, Oral, Daily  . LORazepam (ATIVAN) 0.5 mg, Oral, Daily at bedtime  . Melatonin 2.5 mg, Oral, Daily at bedtime  . Synthroid 25 mcg, Oral, Daily, Take 1.5 pill daily  . Vitamin D3 4,000 Units, Oral, Daily   Radiology:   CT calcium score 11/09/2018: Calcium score 0.  CT angiogram  Abdomen Pelvis 01/16/2020 1. No  abnormality to explain the patient's right lower quadrant abdominal pain. 2. Pancolonic diverticulosis without CT evidence for diverticulitis. 3. Normal appendix in the right lower quadrant. 4. Cardiomegaly.  Cardiac Studies:   GXT 07/26/2016: The patient exercised following the Bruce protocol. The patient reported no symptoms during the stress test. The patient experienced no angina during the stress test. The patient requested the test to be stopped. Blood pressure and heart rate demonstrated a normal response to exercise. Blood pressure demonstrated a normal response to exercise. Overall, the patient's exercise  capacity was normal. 85% of maximum heart rate was achieved after 6.2 minutes. Recovery time: 5 minutes. The patient's response to exercise was adequate for diagnosis. Response to Stress There was no ST segment deviation noted during stress. Arrhythmias during stress: rare PVCs. Arrhythmias during recovery: none. Arrhythmias were not significant. ECG was interpretable and there was no significant change from baseline.  Echocardiogram 11/14/2018: Left ventricle cavity is normal in size. Normal left ventricular shape. Low normal LV systolic function. Doppler evidence of grade I (impaired) diastolic dysfunction, normal LAP. Left ventricle regional wall motion findings: No wall motion abnormalities. Visual EF is 50-55%. Calculated EF 47%. Left atrial cavity is mildly dilated at 4.2 cm. Mild aortic valve leaflet thickening. Trileaflet aortic valve with no regurgitation noted. Mild mitral valve leaflet thickening. Mild (Grade I) mitral regurgitation. No prolapse noted. No evidence of significant pericardial effusion. Anterior fat pad noted.  EKG  EKG 09/22/2020: Normal sinus rhythm at rate of 67 bpm, normal axis, baseline artifact. Probably normal EKG.  No significant change from EKG 02/28/2020.  Assessment     ICD-10-CM   1. Palpitations  R00.2 EKG 12-Lead     No orders of  the defined types were placed in this encounter.   There are no discontinued medications.  Recommendations:   [name]  is a 69 y.o.  female  with hyperlipidemia, hypothyroidism, history of migraines, family history of early CAD, prior second hand tobacco, history of AVRNT s/p ablation with Dr. Lovena Le in 2015, referred back to [name] for evaluation of elevated blood pressure and also palpitations. I last seen her 6 months ago and made her visits as needed.  Coronary calcium score which are 0 in the past and also recently performed abdominal CT shows no evidence of aortic atherosclerosis. Her symptoms of palpitations are probably occasional PACs or PVCs or even normal sinus rhythm that the patient is made aware as she has anxiety and also has had SVT in the past. Do not suspect significant arrhythmias, heart block, bradycardia.  With regard to elevated blood pressure, I reviewed her home recordings which are under perfectly normal readings. Also 6 months ago in our office her blood pressure was normal. Hence she may have a component of whitecoat hypertension and anxiety may be playing a role. Unless she has persistent recordings of >140/80 mmHg, in view of episodes of low blood pressure recorded at home associated with dizziness, I have not recommended continued observation for now. Patient felt reassured.  I do not think she needs event monitor or repeat echocardiogram. I will continue to see her back on a as needed basis and she is aware of this.   Adrian Prows, MD, [facility] 09/22/2020, 4:40 PM Office: [phone] Pager: [phone]

## 2020-10-15 DIAGNOSIS — J301 Allergic rhinitis due to pollen: Secondary | ICD-10-CM | POA: Diagnosis not present

## 2020-10-15 DIAGNOSIS — J3081 Allergic rhinitis due to animal (cat) (dog) hair and dander: Secondary | ICD-10-CM | POA: Diagnosis not present

## 2020-10-15 DIAGNOSIS — J3089 Other allergic rhinitis: Secondary | ICD-10-CM | POA: Diagnosis not present

## 2020-10-29 DIAGNOSIS — Z0000 Encounter for general adult medical examination without abnormal findings: Secondary | ICD-10-CM | POA: Diagnosis not present

## 2020-10-29 DIAGNOSIS — Z23 Encounter for immunization: Secondary | ICD-10-CM | POA: Diagnosis not present

## 2020-10-29 DIAGNOSIS — E039 Hypothyroidism, unspecified: Secondary | ICD-10-CM | POA: Diagnosis not present

## 2020-10-29 DIAGNOSIS — M810 Age-related osteoporosis without current pathological fracture: Secondary | ICD-10-CM | POA: Diagnosis not present

## 2020-10-29 DIAGNOSIS — E785 Hyperlipidemia, unspecified: Secondary | ICD-10-CM | POA: Diagnosis not present

## 2020-11-19 DIAGNOSIS — M810 Age-related osteoporosis without current pathological fracture: Secondary | ICD-10-CM | POA: Diagnosis not present

## 2021-01-12 DIAGNOSIS — R059 Cough, unspecified: Secondary | ICD-10-CM | POA: Diagnosis not present

## 2021-01-15 DIAGNOSIS — Z23 Encounter for immunization: Secondary | ICD-10-CM | POA: Diagnosis not present

## 2021-02-03 ENCOUNTER — Encounter: Payer: Self-pay | Admitting: Gastroenterology

## 2021-02-12 ENCOUNTER — Institutional Professional Consult (permissible substitution): Payer: Medicare Other | Admitting: Pulmonary Disease

## 2021-04-29 DIAGNOSIS — E785 Hyperlipidemia, unspecified: Secondary | ICD-10-CM | POA: Diagnosis not present

## 2021-05-06 DIAGNOSIS — R3915 Urgency of urination: Secondary | ICD-10-CM | POA: Diagnosis not present

## 2021-05-06 DIAGNOSIS — Z6824 Body mass index (BMI) 24.0-24.9, adult: Secondary | ICD-10-CM | POA: Diagnosis not present

## 2021-05-06 DIAGNOSIS — Z124 Encounter for screening for malignant neoplasm of cervix: Secondary | ICD-10-CM | POA: Diagnosis not present

## 2021-05-06 DIAGNOSIS — Z01411 Encounter for gynecological examination (general) (routine) with abnormal findings: Secondary | ICD-10-CM | POA: Diagnosis not present

## 2021-05-06 DIAGNOSIS — Z01419 Encounter for gynecological examination (general) (routine) without abnormal findings: Secondary | ICD-10-CM | POA: Diagnosis not present

## 2021-05-12 ENCOUNTER — Encounter: Payer: Self-pay | Admitting: Podiatry

## 2021-05-12 ENCOUNTER — Ambulatory Visit (INDEPENDENT_AMBULATORY_CARE_PROVIDER_SITE_OTHER): Payer: Medicare Other | Admitting: Podiatry

## 2021-05-12 ENCOUNTER — Other Ambulatory Visit: Payer: Self-pay

## 2021-05-12 ENCOUNTER — Ambulatory Visit (INDEPENDENT_AMBULATORY_CARE_PROVIDER_SITE_OTHER): Payer: Medicare Other

## 2021-05-12 DIAGNOSIS — B351 Tinea unguium: Secondary | ICD-10-CM | POA: Diagnosis not present

## 2021-05-12 DIAGNOSIS — L309 Dermatitis, unspecified: Secondary | ICD-10-CM

## 2021-05-12 DIAGNOSIS — M21612 Bunion of left foot: Secondary | ICD-10-CM | POA: Diagnosis not present

## 2021-05-12 DIAGNOSIS — M21619 Bunion of unspecified foot: Secondary | ICD-10-CM

## 2021-05-12 NOTE — Progress Notes (Signed)
Subjective:   Patient ID: [name], female   DOB: 70 y.o.   MRN: [id number]   HPI Patient presents stating she has numerous different problems with 1 being bunion which is gradually become more symptomatic left 2 thickened nails 4 and 5 left and 3 a small rash plantar aspect left arch which she is using medication for and it is improving.  Patient also thinks she might of had a plantars wart but that is not bothering her and she does not smoke likes to be active   Review of Systems  All other systems reviewed and are negative.      Objective:  Physical Exam Vitals and nursing note reviewed.  Constitutional:      Appearance: She is well-developed.  Pulmonary:     Effort: Pulmonary effort is normal.  Musculoskeletal:        General: Normal range of motion.  Skin:    General: Skin is warm.  Neurological:     Mental Status: She is alert.    Neurovascular status intact muscle strength found to be adequate range of motion adequate.  Patient is noted to have small rash plantar aspect left arch localized thickened nails 4-5 left that are mildly tender and structural bunion moderate nature left with mild discomfort and reduced range of motion of the joint.  Good digital perfusion well oriented     Assessment:  Probability for traumatized 4 and 5 female left with moderate fungal elements that are becoming difficult to cut along with moderate structural bunion and plantar rash left that responded to medication     Plan:  8 NP x-ray reviewed discussed possibility for nail removal in future and explained circumstances when I would do this.  Went ahead today and I did review the bunion and we discussed distal osteotomy recovery and the considerations for surgical intervention long-term  X-rays indicate that there is moderate elevation of the intermetatarsal angle with indications of long-term chronic bunion deformity

## 2021-06-10 DIAGNOSIS — L821 Other seborrheic keratosis: Secondary | ICD-10-CM | POA: Diagnosis not present

## 2021-06-10 DIAGNOSIS — B353 Tinea pedis: Secondary | ICD-10-CM | POA: Diagnosis not present

## 2021-06-10 DIAGNOSIS — L820 Inflamed seborrheic keratosis: Secondary | ICD-10-CM | POA: Diagnosis not present

## 2021-06-10 DIAGNOSIS — D225 Melanocytic nevi of trunk: Secondary | ICD-10-CM | POA: Diagnosis not present

## 2021-06-10 DIAGNOSIS — B351 Tinea unguium: Secondary | ICD-10-CM | POA: Diagnosis not present

## 2021-06-10 DIAGNOSIS — D1721 Benign lipomatous neoplasm of skin and subcutaneous tissue of right arm: Secondary | ICD-10-CM | POA: Diagnosis not present

## 2021-06-10 DIAGNOSIS — D1801 Hemangioma of skin and subcutaneous tissue: Secondary | ICD-10-CM | POA: Diagnosis not present

## 2021-06-17 DIAGNOSIS — Z1231 Encounter for screening mammogram for malignant neoplasm of breast: Secondary | ICD-10-CM | POA: Diagnosis not present

## 2021-06-23 DIAGNOSIS — J3081 Allergic rhinitis due to animal (cat) (dog) hair and dander: Secondary | ICD-10-CM | POA: Diagnosis not present

## 2021-06-23 DIAGNOSIS — J3089 Other allergic rhinitis: Secondary | ICD-10-CM | POA: Diagnosis not present

## 2021-06-23 DIAGNOSIS — J301 Allergic rhinitis due to pollen: Secondary | ICD-10-CM | POA: Diagnosis not present

## 2021-07-02 DIAGNOSIS — Z23 Encounter for immunization: Secondary | ICD-10-CM | POA: Diagnosis not present

## 2021-08-12 DIAGNOSIS — H33312 Horseshoe tear of retina without detachment, left eye: Secondary | ICD-10-CM | POA: Diagnosis not present

## 2021-08-13 DIAGNOSIS — H43392 Other vitreous opacities, left eye: Secondary | ICD-10-CM | POA: Diagnosis not present

## 2021-08-13 DIAGNOSIS — H43812 Vitreous degeneration, left eye: Secondary | ICD-10-CM | POA: Diagnosis not present

## 2021-08-13 DIAGNOSIS — H33312 Horseshoe tear of retina without detachment, left eye: Secondary | ICD-10-CM | POA: Diagnosis not present

## 2021-08-13 DIAGNOSIS — H31091 Other chorioretinal scars, right eye: Secondary | ICD-10-CM | POA: Diagnosis not present

## 2021-08-25 DIAGNOSIS — H2512 Age-related nuclear cataract, left eye: Secondary | ICD-10-CM | POA: Diagnosis not present

## 2021-08-25 DIAGNOSIS — H31092 Other chorioretinal scars, left eye: Secondary | ICD-10-CM | POA: Diagnosis not present

## 2021-08-25 DIAGNOSIS — H43392 Other vitreous opacities, left eye: Secondary | ICD-10-CM | POA: Diagnosis not present

## 2021-08-25 DIAGNOSIS — H43812 Vitreous degeneration, left eye: Secondary | ICD-10-CM | POA: Diagnosis not present

## 2021-08-27 DIAGNOSIS — Z23 Encounter for immunization: Secondary | ICD-10-CM | POA: Diagnosis not present

## 2021-09-20 DIAGNOSIS — R5383 Other fatigue: Secondary | ICD-10-CM | POA: Diagnosis not present

## 2021-09-20 DIAGNOSIS — E039 Hypothyroidism, unspecified: Secondary | ICD-10-CM | POA: Diagnosis not present

## 2021-09-20 DIAGNOSIS — E559 Vitamin D deficiency, unspecified: Secondary | ICD-10-CM | POA: Diagnosis not present

## 2021-09-20 DIAGNOSIS — I10 Essential (primary) hypertension: Secondary | ICD-10-CM | POA: Diagnosis not present

## 2021-09-20 DIAGNOSIS — H9193 Unspecified hearing loss, bilateral: Secondary | ICD-10-CM | POA: Diagnosis not present

## 2021-09-20 DIAGNOSIS — N3944 Nocturnal enuresis: Secondary | ICD-10-CM | POA: Diagnosis not present

## 2021-09-20 DIAGNOSIS — D122 Benign neoplasm of ascending colon: Secondary | ICD-10-CM | POA: Diagnosis not present

## 2021-10-04 DIAGNOSIS — R5383 Other fatigue: Secondary | ICD-10-CM | POA: Diagnosis not present

## 2021-10-04 DIAGNOSIS — I10 Essential (primary) hypertension: Secondary | ICD-10-CM | POA: Diagnosis not present

## 2021-10-04 DIAGNOSIS — D508 Other iron deficiency anemias: Secondary | ICD-10-CM | POA: Diagnosis not present

## 2021-10-12 DIAGNOSIS — M6281 Muscle weakness (generalized): Secondary | ICD-10-CM | POA: Diagnosis not present

## 2021-10-12 DIAGNOSIS — M6289 Other specified disorders of muscle: Secondary | ICD-10-CM | POA: Diagnosis not present

## 2021-10-12 DIAGNOSIS — M62838 Other muscle spasm: Secondary | ICD-10-CM | POA: Diagnosis not present

## 2021-10-12 DIAGNOSIS — N3946 Mixed incontinence: Secondary | ICD-10-CM | POA: Diagnosis not present

## 2021-10-22 DIAGNOSIS — N3944 Nocturnal enuresis: Secondary | ICD-10-CM | POA: Diagnosis not present

## 2021-10-22 DIAGNOSIS — M62838 Other muscle spasm: Secondary | ICD-10-CM | POA: Diagnosis not present

## 2021-10-22 DIAGNOSIS — K5900 Constipation, unspecified: Secondary | ICD-10-CM | POA: Diagnosis not present

## 2021-10-22 DIAGNOSIS — M6289 Other specified disorders of muscle: Secondary | ICD-10-CM | POA: Diagnosis not present

## 2021-10-22 DIAGNOSIS — M6281 Muscle weakness (generalized): Secondary | ICD-10-CM | POA: Diagnosis not present

## 2021-10-22 DIAGNOSIS — N3946 Mixed incontinence: Secondary | ICD-10-CM | POA: Diagnosis not present

## 2021-10-27 DIAGNOSIS — M6289 Other specified disorders of muscle: Secondary | ICD-10-CM | POA: Diagnosis not present

## 2021-10-27 DIAGNOSIS — M62838 Other muscle spasm: Secondary | ICD-10-CM | POA: Diagnosis not present

## 2021-10-27 DIAGNOSIS — N3944 Nocturnal enuresis: Secondary | ICD-10-CM | POA: Diagnosis not present

## 2021-10-27 DIAGNOSIS — M6281 Muscle weakness (generalized): Secondary | ICD-10-CM | POA: Diagnosis not present

## 2021-10-27 DIAGNOSIS — K5900 Constipation, unspecified: Secondary | ICD-10-CM | POA: Diagnosis not present

## 2021-10-27 DIAGNOSIS — R102 Pelvic and perineal pain: Secondary | ICD-10-CM | POA: Diagnosis not present

## 2021-11-01 DIAGNOSIS — D649 Anemia, unspecified: Secondary | ICD-10-CM | POA: Diagnosis not present

## 2021-11-01 DIAGNOSIS — I10 Essential (primary) hypertension: Secondary | ICD-10-CM | POA: Diagnosis not present

## 2021-11-01 DIAGNOSIS — Z0000 Encounter for general adult medical examination without abnormal findings: Secondary | ICD-10-CM | POA: Diagnosis not present

## 2021-11-02 DIAGNOSIS — M6281 Muscle weakness (generalized): Secondary | ICD-10-CM | POA: Diagnosis not present

## 2021-11-02 DIAGNOSIS — N3944 Nocturnal enuresis: Secondary | ICD-10-CM | POA: Diagnosis not present

## 2021-11-02 DIAGNOSIS — M6289 Other specified disorders of muscle: Secondary | ICD-10-CM | POA: Diagnosis not present

## 2021-11-02 DIAGNOSIS — M62838 Other muscle spasm: Secondary | ICD-10-CM | POA: Diagnosis not present

## 2021-11-02 DIAGNOSIS — K5900 Constipation, unspecified: Secondary | ICD-10-CM | POA: Diagnosis not present

## 2021-11-02 DIAGNOSIS — R102 Pelvic and perineal pain: Secondary | ICD-10-CM | POA: Diagnosis not present

## 2021-11-04 DIAGNOSIS — E039 Hypothyroidism, unspecified: Secondary | ICD-10-CM | POA: Diagnosis not present

## 2021-11-04 DIAGNOSIS — Z9189 Other specified personal risk factors, not elsewhere classified: Secondary | ICD-10-CM | POA: Diagnosis not present

## 2021-11-04 DIAGNOSIS — E559 Vitamin D deficiency, unspecified: Secondary | ICD-10-CM | POA: Diagnosis not present

## 2021-11-04 DIAGNOSIS — E538 Deficiency of other specified B group vitamins: Secondary | ICD-10-CM | POA: Diagnosis not present

## 2021-11-04 DIAGNOSIS — Z0000 Encounter for general adult medical examination without abnormal findings: Secondary | ICD-10-CM | POA: Diagnosis not present

## 2021-11-04 DIAGNOSIS — D508 Other iron deficiency anemias: Secondary | ICD-10-CM | POA: Diagnosis not present

## 2021-11-04 DIAGNOSIS — R3121 Asymptomatic microscopic hematuria: Secondary | ICD-10-CM | POA: Diagnosis not present

## 2021-11-04 DIAGNOSIS — M2550 Pain in unspecified joint: Secondary | ICD-10-CM | POA: Diagnosis not present

## 2021-11-04 DIAGNOSIS — R5383 Other fatigue: Secondary | ICD-10-CM | POA: Diagnosis not present

## 2021-11-04 DIAGNOSIS — Z86010 Personal history of colonic polyps: Secondary | ICD-10-CM | POA: Diagnosis not present

## 2021-11-04 DIAGNOSIS — E785 Hyperlipidemia, unspecified: Secondary | ICD-10-CM | POA: Diagnosis not present

## 2021-11-04 DIAGNOSIS — F419 Anxiety disorder, unspecified: Secondary | ICD-10-CM | POA: Diagnosis not present

## 2021-11-05 ENCOUNTER — Encounter: Payer: Self-pay | Admitting: Gastroenterology

## 2021-11-05 ENCOUNTER — Ambulatory Visit (INDEPENDENT_AMBULATORY_CARE_PROVIDER_SITE_OTHER): Payer: Medicare Other | Admitting: Gastroenterology

## 2021-11-05 DIAGNOSIS — R1310 Dysphagia, unspecified: Secondary | ICD-10-CM | POA: Diagnosis not present

## 2021-11-05 DIAGNOSIS — K5900 Constipation, unspecified: Secondary | ICD-10-CM

## 2021-11-05 DIAGNOSIS — Z86010 Personal history of colonic polyps: Secondary | ICD-10-CM

## 2021-11-05 MED ORDER — CITRUCEL PO POWD: 1.0000 | Freq: Every day | ORAL | Status: DC

## 2021-11-05 MED ORDER — FAMOTIDINE 20 MG PO TABS: 20.0000 mg | ORAL_TABLET | Freq: Every day | ORAL | 3 refills | Status: DC

## 2021-11-05 NOTE — Patient Instructions (Addendum)
If you are age 70 or older, your body mass index should be between 23-30. Your Body mass index is 23.66 kg/m. If this is out of the aforementioned range listed, please consider follow up with your Primary Care Provider.  The [facility] providers would like to encourage you to use [facility] to communicate with providers for non-urgent requests or questions.  Due to long hold times on the telephone, sending your provider a message by [facility] may be a faster and more efficient way to get a response.  Please allow 48 business hours for a response.  Please remember that this is for non-urgent requests.  _______________________________________________________  [name] have been scheduled for an endoscopy and colonoscopy. Please follow the written instructions given to you at your visit today. Please pick up your prep supplies at the pharmacy within the next 1-3 days. If you use inhalers (even only as needed), please bring them with you on the day of your procedure.  Due to recent changes in healthcare laws, you may see the results of your imaging and laboratory studies on MyChart before your provider has had a chance to review them.  We understand that in some cases there may be results that are confusing or concerning to you. Not all laboratory results come back in the same time frame and the provider may be waiting for multiple results in order to interpret others.  Please give [name] 48 hours in order for your provider to thoroughly review all the results before contacting the office for clarification of your results.   We have sent the following medications to your pharmacy for you to pick up at your convenience:  Pepcid 20mg  take one tablet at bedtime each night.  Please start taking citrucel (orange flavored) powder fiber supplement.  This may cause some bloating at first but that usually goes away. Begin with a small spoonful and work your way up to a large, heaping spoonful daily over a week.  Thank you  for entrusting me with your care and choosing [facility].  Dr Ardis Hughs

## 2021-11-05 NOTE — Progress Notes (Signed)
Review of pertinent gastrointestinal problems: 1.  Precancerous colon polyps colonoscopy 09/2006 Dr.  Rose Fillers, [place] done for screening; found 2 polyps (seems that one was >1cm however the report is handwritten and not perfectly legible), also diffuse diverticulosis; pathology TA with focal HGD. Repeat colonoscopy 2008, single TA removed. Colonoscopy Dr. Ardis Hughs 02/2014 one 1.8cm ascending colon TVA removed in piecemeal fashion, site labeled with SPOT.  Colonoscopy March 2017 Dr. Ardis Hughs found multiple diverticulum throughout the colon.  The site of the 2015 ascending colon polypectomy was easily located and there was no residual polypoid mucosa at the site.  I recommended repeat colonoscopy at 5-year interval   HPI: This is a   very pleasant 71 year old woman  Her last visit was a telemedicine visit with Nevin Bloodgood, February 2021.  This was for right-sided abdominal pains.  She ended up having a CT scan abdomen pelvis with IV and oral contrast April 2021 and there was no explanation for her right-sided abdominal discomforts.  She is here today for a new problem.  Lab testing December 2022 hemoglobin 10.9, MCV 85, TSH normal, complete metabolic profile normal.  TIBC normal, percent saturation 12 which was slightly low, total iron 41, vitamin B12 444  Repeat CBC January 2023 hemoglobin 11.5  She struggles with mild constipation.  This is improved with increasing her water intake and doing some massage on her abdomen and trying not to strain too much.  She has not tried fiber supplements in years.  She does not see blood in her stool.  She has had difficulty with swallowing vitamins lately, the larger of the worse.  She does not have any swallowing troubles with food or liquids.  She has a strange sensation in the middle of the night often.  She says she feels a loud breathing sound in her throat.  Some fluttering type sensations in her chest this lasts only about a minute.  She has intermittent right  lower quadrant pains especially at night, turning onto her left side will help.  She has pinpoint this pain with a urology provider and it is very low in her pelvis.  She is admittedly quite a nervous high strung person.  Prozac has been helping keep her calm.  She does not take NSAIDs.  Her weight is slightly down the past year, 4 pounds in a year.  She does not have nausea or vomiting.  Old Data Reviewed:     Review of systems: Pertinent positive and negative review of systems were noted in the above HPI section. All other review negative.   Past Medical History:  Diagnosis Date   Anxiety    AV Nodal Reentry Tachycardia    s/p RFCA GT 2/15   AVNRT (AV nodal re-entry tachycardia) ([place]) 11/06/2013   Cataract    Diverticulosis 01/2020   H/O radiofrequency ablation for complex left atrial arrhythmia 12/12/2013   Headache    Hemorrhoids    Hyperlipemia    Hypothyroidism    Mitral valve prolapse    ?   Rectal leakage    1 time   Retinal tear of both eyes     Past Surgical History:  Procedure Laterality Date   ABLATION  11/21/2013   RFCA of AVNRT by Dr Lovena Le   BREAST LUMPECTOMY Left 2000   left breast-   CATARCT     RIGHT EYE IN 09/2015   COLONOSCOPY  2007, 2008   RETINAL DETACHMENT SURGERY     RIGHT EYE/SILICONE [place]  SEPTOPLASTY     1980   skin lesions     left leg,right leg   SUPRAVENTRICULAR TACHYCARDIA ABLATION N/A 11/21/2013   Procedure: SUPRAVENTRICULAR TACHYCARDIA ABLATION;  Surgeon: Evans Lance, MD;  Location: [facility] CATH LAB;  Service: Cardiovascular;  Laterality: N/A;    Current Outpatient Medications  Medication Sig Dispense Refill   atorvastatin (LIPITOR) 20 MG tablet Take 1 tablet by mouth every other day.     ciclopirox (LOPROX) 0.77 % cream Apply 1 application topically as needed.     ciclopirox (PENLAC) 8 % solution Apply topically as needed.     clotrimazole (LOTRIMIN) 1 % cream as needed.     Cyanocobalamin (VITAMIN B12  PO) Take by mouth daily. GUMMIES     Ferrous Sulfate (IRON) 325 (65 Fe) MG TABS Take by mouth daily. 1/2 tablet for 4 weeks and then increase to 1 tablet     fexofenadine (ALLEGRA) 180 MG tablet Take 180 mg by mouth as needed.     FLUoxetine (PROZAC) 40 MG capsule Take 1 tablet by mouth daily.     levothyroxine (SYNTHROID) 25 MCG tablet Take 25 mcg by mouth daily. Take 1.5 pill daily     melatonin 5 MG TABS 1.5  tablet at bedtime as needed with food     Multiple Vitamin (MULTIVITAMIN) tablet Take 1 tablet by mouth daily.     omeprazole (PRILOSEC) 20 MG capsule as needed.     No current facility-administered medications for this visit.    Allergies as of 11/05/2021 - Review Complete 11/05/2021  Allergen Reaction Noted   Crestor [rosuvastatin calcium] Other (See Comments) 05/28/2018   Pravastatin  05/28/2018    Family History  Problem Relation Age of Onset   Heart attack Mother 54   Pancreatic cancer Mother    Heart attack Father 59   Heart attack Sister 66   Heart disease Sister    Colon cancer Paternal Uncle 28   Diabetes Nephew    Hernia Paternal Aunt     Social History   Socioeconomic History   Marital status: Widowed    Spouse name: Not on file   Number of children: 1   Years of education: Not on file   Highest education level: Not on file  Occupational History   Occupation: Retired  Tobacco Use   Smoking status: Never   Smokeless tobacco: Never  Vaping Use   Vaping Use: Never used  Substance and Sexual Activity   Alcohol use: Yes    Alcohol/week: 1.0 standard drink    Types: 1 Glasses of wine per week    Comment: socially   Drug use: No   Sexual activity: Not on file  Other Topics Concern   Not on file  Social History Narrative   Not on file   Social Determinants of Health   Financial Resource Strain: Not on file  Food Insecurity: Not on file  Transportation Needs: Not on file  Physical Activity: Not on file  Stress: Not on file  Social Connections:  Not on file  Intimate Partner Violence: Not on file     Physical Exam: Pulse 82    Ht 5' 4.5" (1.638 m)    Wt 140 lb (63.5 kg)    SpO2 98%    BMI 23.66 kg/m  Constitutional: generally well-appearing Psychiatric: alert and oriented x3 Eyes: extraocular movements intact Mouth: oral pharynx moist, no lesions Neck: supple no lymphadenopathy Cardiovascular: heart regular rate and rhythm Lungs: clear to auscultation bilaterally Abdomen:  soft, nontender, nondistended, no obvious ascites, no peritoneal signs, normal bowel sounds Extremities: no lower extremity edema bilaterally Skin: no lesions on visible extremities   Assessment and plan: 71 y.o. female with personal history of precancerous colon polyps, mild intermittent constipation, intermittent dysphagia to pills, strange overnight sensation of breathing in her throat, slight fluttering in her chest  First I recommended that she try fiber supplements for her mild constipation.  I specifically recommended Citrucel on told her she should continue to try to drink a lot of fluids as well.  She is "due" for surveillance colonoscopy given her polyp history with I recommended we proceed with colonoscopy at her soonest convenience.  She is having dysphagia to pills only, not to solid food or liquids.  She is having strange nighttime sensation of breathing in her throat, fluttering a bit in her chest.  Perhaps these are somewhat GERD related.  I recommended she try taking Pepcid/famotidine 20 mg 1 pill at bedtime every night and at the same time as her colonoscopy.  Be a good idea to also look in her stomach, esophagus with upper endoscopy to exclude significant GERD damage, neoplasm which I think is quite unlikely.  Please see the "Patient Instructions" section for addition details about the plan.   [name], MD [facility] 11/05/2021, 10:57 AM  Cc: [name], FNP  Total time on date of encounter was 45  minutes  (this included time spent preparing to see the patient reviewing records; obtaining and/or reviewing separately obtained history; performing a medically appropriate exam and/or evaluation; counseling and educating the patient and family if present; ordering medications, tests or procedures if applicable; and documenting clinical information in the health record).

## 2021-11-09 DIAGNOSIS — N3944 Nocturnal enuresis: Secondary | ICD-10-CM | POA: Diagnosis not present

## 2021-11-09 DIAGNOSIS — K5900 Constipation, unspecified: Secondary | ICD-10-CM | POA: Diagnosis not present

## 2021-11-09 DIAGNOSIS — M6281 Muscle weakness (generalized): Secondary | ICD-10-CM | POA: Diagnosis not present

## 2021-11-09 DIAGNOSIS — M62838 Other muscle spasm: Secondary | ICD-10-CM | POA: Diagnosis not present

## 2021-11-09 DIAGNOSIS — M6289 Other specified disorders of muscle: Secondary | ICD-10-CM | POA: Diagnosis not present

## 2021-11-12 DIAGNOSIS — N3021 Other chronic cystitis with hematuria: Secondary | ICD-10-CM | POA: Diagnosis not present

## 2021-11-12 DIAGNOSIS — N3944 Nocturnal enuresis: Secondary | ICD-10-CM | POA: Diagnosis not present

## 2021-11-12 DIAGNOSIS — R3121 Asymptomatic microscopic hematuria: Secondary | ICD-10-CM | POA: Diagnosis not present

## 2021-11-12 DIAGNOSIS — R102 Pelvic and perineal pain: Secondary | ICD-10-CM | POA: Diagnosis not present

## 2021-11-12 DIAGNOSIS — N3946 Mixed incontinence: Secondary | ICD-10-CM | POA: Diagnosis not present

## 2021-11-18 DIAGNOSIS — D508 Other iron deficiency anemias: Secondary | ICD-10-CM | POA: Diagnosis not present

## 2021-11-18 DIAGNOSIS — Z8719 Personal history of other diseases of the digestive system: Secondary | ICD-10-CM | POA: Diagnosis not present

## 2021-11-29 DIAGNOSIS — J310 Chronic rhinitis: Secondary | ICD-10-CM | POA: Diagnosis not present

## 2021-11-29 DIAGNOSIS — H6983 Other specified disorders of Eustachian tube, bilateral: Secondary | ICD-10-CM | POA: Diagnosis not present

## 2021-11-29 DIAGNOSIS — H9313 Tinnitus, bilateral: Secondary | ICD-10-CM | POA: Diagnosis not present

## 2021-11-29 DIAGNOSIS — H9193 Unspecified hearing loss, bilateral: Secondary | ICD-10-CM | POA: Diagnosis not present

## 2021-11-29 DIAGNOSIS — H903 Sensorineural hearing loss, bilateral: Secondary | ICD-10-CM | POA: Diagnosis not present

## 2021-12-02 DIAGNOSIS — M2550 Pain in unspecified joint: Secondary | ICD-10-CM | POA: Diagnosis not present

## 2021-12-02 DIAGNOSIS — E785 Hyperlipidemia, unspecified: Secondary | ICD-10-CM | POA: Diagnosis not present

## 2021-12-02 DIAGNOSIS — E559 Vitamin D deficiency, unspecified: Secondary | ICD-10-CM | POA: Diagnosis not present

## 2021-12-02 DIAGNOSIS — E538 Deficiency of other specified B group vitamins: Secondary | ICD-10-CM | POA: Diagnosis not present

## 2021-12-02 DIAGNOSIS — D508 Other iron deficiency anemias: Secondary | ICD-10-CM | POA: Diagnosis not present

## 2021-12-09 DIAGNOSIS — I10 Essential (primary) hypertension: Secondary | ICD-10-CM | POA: Diagnosis not present

## 2021-12-09 DIAGNOSIS — R0609 Other forms of dyspnea: Secondary | ICD-10-CM | POA: Diagnosis not present

## 2021-12-09 DIAGNOSIS — R000 Tachycardia, unspecified: Secondary | ICD-10-CM | POA: Diagnosis not present

## 2021-12-09 DIAGNOSIS — F419 Anxiety disorder, unspecified: Secondary | ICD-10-CM | POA: Diagnosis not present

## 2021-12-09 DIAGNOSIS — R768 Other specified abnormal immunological findings in serum: Secondary | ICD-10-CM | POA: Diagnosis not present

## 2021-12-09 DIAGNOSIS — D649 Anemia, unspecified: Secondary | ICD-10-CM | POA: Diagnosis not present

## 2021-12-13 ENCOUNTER — Ambulatory Visit (AMBULATORY_SURGERY_CENTER): Payer: Medicare Other | Admitting: Gastroenterology

## 2021-12-13 ENCOUNTER — Encounter: Payer: Self-pay | Admitting: Gastroenterology

## 2021-12-13 VITALS — BP 114/67 | HR 71 | Temp 98.1°F | Resp 16 | Ht 64.0 in | Wt 140.0 lb

## 2021-12-13 DIAGNOSIS — K2970 Gastritis, unspecified, without bleeding: Secondary | ICD-10-CM | POA: Diagnosis not present

## 2021-12-13 DIAGNOSIS — Z86010 Personal history of colonic polyps: Secondary | ICD-10-CM | POA: Diagnosis not present

## 2021-12-13 DIAGNOSIS — D123 Benign neoplasm of transverse colon: Secondary | ICD-10-CM | POA: Diagnosis not present

## 2021-12-13 DIAGNOSIS — K2950 Unspecified chronic gastritis without bleeding: Secondary | ICD-10-CM | POA: Diagnosis not present

## 2021-12-13 MED ORDER — SODIUM CHLORIDE 0.9 % IV SOLN: 500.0000 mL | Freq: Once | INTRAVENOUS | Status: DC

## 2021-12-13 NOTE — Progress Notes (Signed)
colonoscopy 09/2006 Dr.  Rose Fillers, [place] done for screening; found 2 polyps (seems that one was >1cm however the report is handwritten and not perfectly legible), also diffuse diverticulosis; pathology TA with focal HGD. Repeat colonoscopy 2008, single TA removed. Colonoscopy Dr. Ardis Hughs 02/2014 one 1.8cm ascending colon TVA removed in piecemeal fashion, site labeled with SPOT.  Colonoscopy March 2017 Dr. Ardis Hughs found multiple diverticulum throughout the colon.  The site of the 2015 ascending colon polypectomy was easily located and there was no residual polypoid mucosa at the site.   HPI: This is a woman with h/o polyps and recent pill dsyphagia, fluttering in chest   ROS: complete GI ROS as described in HPI, all other review negative.  Constitutional:  No unintentional weight loss   Past Medical History:  Diagnosis Date   Anxiety    AV Nodal Reentry Tachycardia    s/p RFCA GT 2/15   AVNRT (AV nodal re-entry tachycardia) ([place]) 11/06/2013   Cataract    Diverticulosis 01/2020   H/O radiofrequency ablation for complex left atrial arrhythmia 12/12/2013   Headache    Hemorrhoids    Hyperlipemia    Hypothyroidism    Mitral valve prolapse    ?   Rectal leakage    1 time   Retinal tear of both eyes     Past Surgical History:  Procedure Laterality Date   ABLATION  11/21/2013   RFCA of AVNRT by Dr Lovena Le   BREAST LUMPECTOMY Left 2000   left breast-   CATARCT     RIGHT EYE IN 09/2015   COLONOSCOPY  2007, 2008   RETINAL DETACHMENT SURGERY     RIGHT EYE/SILICONE [place]   RHINOPLASTY  1980   SEPTOPLASTY     1980   skin lesions     left leg,right leg   SUPRAVENTRICULAR TACHYCARDIA ABLATION N/A 11/21/2013   Procedure: SUPRAVENTRICULAR TACHYCARDIA ABLATION;  Surgeon: Evans Lance, MD;  Location: [facility] CATH LAB;  Service: Cardiovascular;  Laterality: N/A;    Current Outpatient Medications  Medication Sig Dispense Refill   atorvastatin (LIPITOR) 20 MG tablet Take 1 tablet by mouth  every other day.     Cyanocobalamin (VITAMIN B12 PO) Take by mouth daily. GUMMIES     famotidine (PEPCID) 20 MG tablet Take 1 tablet (20 mg total) by mouth at bedtime. 30 tablet 3   Ferrous Sulfate (IRON) 325 (65 Fe) MG TABS Take by mouth daily. 1/2 tablet for 4 weeks and then increase to 1 tablet     FLUoxetine (PROZAC) 40 MG capsule Take 1 tablet by mouth daily.     levothyroxine (SYNTHROID) 25 MCG tablet Take 25 mcg by mouth daily. Take 1.5 pill daily     ciclopirox (LOPROX) 0.77 % cream Apply 1 application topically as needed.     ciclopirox (PENLAC) 8 % solution Apply topically as needed.     clotrimazole (LOTRIMIN) 1 % cream as needed.     fexofenadine (ALLEGRA) 180 MG tablet Take 180 mg by mouth as needed. (Patient not taking: Reported on 12/13/2021)     melatonin 5 MG TABS 1.5  tablet at bedtime as needed with food     methylcellulose (CITRUCEL) oral powder Take 1 packet by mouth daily. (Patient not taking: Reported on 12/13/2021)     Multiple Vitamin (MULTIVITAMIN) tablet Take 1 tablet by mouth daily.     omeprazole (PRILOSEC) 20 MG capsule as needed.     Current Facility-Administered Medications  Medication Dose Route Frequency Provider Last Rate Last  Admin   0.9 %  sodium chloride infusion  500 mL Intravenous Once Milus Banister, MD        Allergies as of 12/13/2021 - Review Complete 12/13/2021  Allergen Reaction Noted   Crestor [rosuvastatin calcium] Other (See Comments) 05/28/2018   Pravastatin  05/28/2018    Family History  Problem Relation Age of Onset   Heart attack Mother 62   Pancreatic cancer Mother    Heart attack Father 4   Heart attack Sister 53   Heart disease Sister    Hernia Paternal Aunt    Colon cancer Paternal Uncle 76   Diabetes Nephew    Rectal cancer Neg Hx    Stomach cancer Neg Hx    Esophageal cancer Neg Hx     Social History   Socioeconomic History   Marital status: Widowed    Spouse name: Not on file   Number of children: 1   Years of  education: Not on file   Highest education level: Not on file  Occupational History   Occupation: Retired  Tobacco Use   Smoking status: Never   Smokeless tobacco: Never  Vaping Use   Vaping Use: Never used  Substance and Sexual Activity   Alcohol use: Yes    Alcohol/week: 1.0 standard drink    Types: 1 Glasses of wine per week    Comment: socially   Drug use: No   Sexual activity: Not on file  Other Topics Concern   Not on file  Social History Narrative   Not on file   Social Determinants of Health   Financial Resource Strain: Not on file  Food Insecurity: Not on file  Transportation Needs: Not on file  Physical Activity: Not on file  Stress: Not on file  Social Connections: Not on file  Intimate Partner Violence: Not on file     Physical Exam: BP (!) 143/77    Pulse 82    Temp 98.1 F (36.7 C) (Temporal)    Ht 5\' 4"  (1.626 m)    Wt 140 lb (63.5 kg)    SpO2 99%    BMI 24.03 kg/m  Constitutional: generally well-appearing Psychiatric: alert and oriented x3 Lungs: CTA bilaterally Heart: no MCR  Assessment and plan: 71 y.o. female with  polyps and recent pill dsyphagia, fluttering in chest  Colonoscopy and egd tioday  Care is appropriate for the ambulatory setting.  [name], MD [facility] 12/13/2021, 2:43 PM

## 2021-12-13 NOTE — Op Note (Signed)
[place] Patient Name: [name] Procedure Date: 12/13/2021 2:47 PM MRN: [id number] Endoscopist: Milus Banister , MD Age: 71 Referring MD:  Date of Birth: 07-24-1951 Gender: Female Account #: [id number] Procedure:                Colonoscopy Indications:              High risk colon cancer surveillance: Personal                            history of colonic polyps; colonoscopy 09/2006 Dr.                            Rose Fillers, [place] done for screening; found 2                            polyps (seems that one was >1cm however the report                            is handwritten and not perfectly legible), also                            diffuse diverticulosis; pathology TA with focal                            HGD. Repeat colonoscopy 2008, single TA removed.                            Colonoscopy Dr. Ardis Hughs 02/2014 one 1.8cm ascending                            colon TVA removed in piecemeal fashion, site                            labeled with SPOT.Colonoscopy March 2017 Dr.                            Ardis Hughs found multiple diverticulum throughout the                            colon. The site of the 2015 ascending colon                            polypectomy was easily located and there was no                            residual polypoid mucosa at the site Medicines:                Monitored Anesthesia Care Procedure:                Pre-Anesthesia Assessment:                           - Prior to the procedure, a History and Physical  was performed, and patient medications and                            allergies were reviewed. The patient's tolerance of                            previous anesthesia was also reviewed. The risks                            and benefits of the procedure and the sedation                            options and risks were discussed with the patient.                            All questions were answered, and  informed consent                            was obtained. Prior Anticoagulants: The patient has                            taken no previous anticoagulant or antiplatelet                            agents. ASA Grade Assessment: II - A patient with                            mild systemic disease. After reviewing the risks                            and benefits, the patient was deemed in                            satisfactory condition to undergo the procedure.                            After obtaining informed consent, the colonoscope                            was passed under direct vision. Throughout the                            procedure, the patient's blood pressure, pulse, and                            oxygen saturations were monitored continuously. The                            Olympus #[id number] was introduced through the anus                            and advanced to the the cecum, identified by  appendiceal orifice and ileocecal valve. The                            colonoscopy was performed without difficulty. The                            patient tolerated the procedure well. The quality                            of the bowel preparation was good. The ileocecal                            valve, appendiceal orifice, and rectum were                            photographed. Scope In: 2:55:02 PM Scope Out: 3:10:05 PM Scope Withdrawal Time: 0 hours 6 minutes 21 seconds  Total Procedure Duration: 0 hours 15 minutes 3 seconds  Findings:                 A 3 mm polyp was found in the descending colon. The                            polyp was sessile. The polyp was removed with a                            cold snare. Resection and retrieval were complete.                           The site of the 2015 ascending colon polypectomy                            was easily located with still very visible previous                            tattoo and there  was no residual polypoid mucosa at                            the site                           Multiple small and large-mouthed diverticula were                            found in the entire colon.                           The exam was otherwise without abnormality on                            direct and retroflexion views. Complications:            No immediate complications. Estimated blood loss:  None. Estimated Blood Loss:     Estimated blood loss: none. Impression:               - One 3 mm polyp in the descending colon, removed                            with a cold snare. Resected and retrieved.                           - The site of the 2015 ascending colon polypectomy                            was easily located and there was no residual                            polypoid mucosa at the site                           - Diverticulosis in the entire examined colon.                           - The examination was otherwise normal on direct                            and retroflexion views. Recommendation:           - Await pathology results.                           - EGD now. Milus Banister, MD 12/13/2021 3:18:22 PM This report has been signed electronically.

## 2021-12-13 NOTE — Op Note (Signed)
[place] Patient Name: [name] Procedure Date: 12/13/2021 2:39 PM MRN: [id number] Endoscopist: Milus Banister , MD Age: 71 Referring MD:  Date of Birth: 05-27-1951 Gender: Female Account #: [id number] Procedure:                Upper GI endoscopy Indications:              Dysphagia to pills, fluttering in chest Medicines:                Monitored Anesthesia Care Procedure:                Pre-Anesthesia Assessment:                           - Prior to the procedure, a History and Physical                            was performed, and patient medications and                            allergies were reviewed. The patient's tolerance of                            previous anesthesia was also reviewed. The risks                            and benefits of the procedure and the sedation                            options and risks were discussed with the patient.                            All questions were answered, and informed consent                            was obtained. Prior Anticoagulants: The patient has                            taken no previous anticoagulant or antiplatelet                            agents. ASA Grade Assessment: II - A patient with                            mild systemic disease. After reviewing the risks                            and benefits, the patient was deemed in                            satisfactory condition to undergo the procedure.                           After obtaining informed consent, the endoscope was  passed under direct vision. Throughout the                            procedure, the patient's blood pressure, pulse, and                            oxygen saturations were monitored continuously. The                            Endoscope was introduced through the mouth, and                            advanced to the second part of duodenum. The upper                            GI endoscopy  was accomplished without difficulty.                            The patient tolerated the procedure well. Scope In: Scope Out: Findings:                 Mild inflammation characterized by erythema was                            found in the gastric antrum. Biopsies were taken                            with a cold forceps for histology.                           The exam was otherwise without abnormality. Complications:            No immediate complications. Estimated blood loss:                            None. Estimated Blood Loss:     Estimated blood loss: none. Impression:               - Mild, non-specific distal gastritis. Biopsied to                            check for H. pylori.                           - The examination was otherwise normal. Recommendation:           - Patient has a contact number available for                            emergencies. The signs and symptoms of potential                            delayed complications were discussed with the                            patient. Return to normal activities tomorrow.  Written discharge instructions were provided to the                            patient.                           - Resume previous diet.                           - Continue present medications.                           - Await pathology results. Milus Banister, MD 12/13/2021 3:20:46 PM This report has been signed electronically.

## 2021-12-13 NOTE — Patient Instructions (Signed)
Handouts provided on polyps, diverticulosis and gastritis.   YOU HAD AN ENDOSCOPIC PROCEDURE TODAY AT [facility]:   Refer to the procedure report that was given to you for any specific questions about what was found during the examination.  If the procedure report does not answer your questions, please call your gastroenterologist to clarify.  If you requested that your care partner not be given the details of your procedure findings, then the procedure report has been included in a sealed envelope for you to review at your convenience later.  YOU SHOULD EXPECT: Some feelings of bloating in the abdomen. Passage of more gas than usual.  Walking can help get rid of the air that was put into your GI tract during the procedure and reduce the bloating. If you had a lower endoscopy (such as a colonoscopy or flexible sigmoidoscopy) you may notice spotting of blood in your stool or on the toilet paper. If you underwent a bowel prep for your procedure, you may not have a normal bowel movement for a few days.  Please Note:  You might notice some irritation and congestion in your nose or some drainage.  This is from the oxygen used during your procedure.  There is no need for concern and it should clear up in a day or so.  SYMPTOMS TO REPORT IMMEDIATELY:  Following lower endoscopy (colonoscopy or flexible sigmoidoscopy):  Excessive amounts of blood in the stool  Significant tenderness or worsening of abdominal pains  Swelling of the abdomen that is new, acute  Fever of 100F or higher  Following upper endoscopy (EGD)  Vomiting of blood or coffee ground material  New chest pain or pain under the shoulder blades  Painful or persistently difficult swallowing  New shortness of breath  Fever of 100F or higher  Black, tarry-looking stools  For urgent or emergent issues, a gastroenterologist can be reached at any hour by calling [phone]. Do not use MyChart messaging for urgent  concerns.    DIET:  We do recommend a small meal at first, but then you may proceed to your regular diet.  Drink plenty of fluids but you should avoid alcoholic beverages for 24 hours.  ACTIVITY:  You should plan to take it easy for the rest of today and you should NOT DRIVE or use heavy machinery until tomorrow (because of the sedation medicines used during the test).    FOLLOW UP: Our staff will call the number listed on your records 48-72 hours following your procedure to check on you and address any questions or concerns that you may have regarding the information given to you following your procedure. If we do not reach you, we will leave a message.  We will attempt to reach you two times.  During this call, we will ask if you have developed any symptoms of COVID 19. If you develop any symptoms (ie: fever, flu-like symptoms, shortness of breath, cough etc.) before then, please call [phone].  If you test positive for Covid 19 in the 2 weeks post procedure, please call and report this information to [name].    If any biopsies were taken you will be contacted by phone or by letter within the next 1-3 weeks.  Please call us at [phone] if you have not heard about the biopsies in 3 weeks.    SIGNATURES/CONFIDENTIALITY: You and/or your care partner have signed paperwork which will be entered into your electronic medical record.  These signatures attest to the  fact that that the information above on your After Visit Summary has been reviewed and is understood.  Full responsibility of the confidentiality of this discharge information lies with you and/or your care-partner.

## 2021-12-13 NOTE — Progress Notes (Signed)
Called to room to assist during endoscopic procedure.  Patient ID and intended procedure confirmed with present staff. Received instructions for my participation in the procedure from the performing physician.  

## 2021-12-13 NOTE — Progress Notes (Signed)
To pacu, VSS. Report to [email or web address] 

## 2021-12-13 NOTE — Progress Notes (Signed)
Pt's states no medical or surgical changes since previsit or office visit. 

## 2021-12-14 ENCOUNTER — Other Ambulatory Visit: Payer: Medicare Other

## 2021-12-14 ENCOUNTER — Other Ambulatory Visit: Payer: Self-pay

## 2021-12-14 ENCOUNTER — Other Ambulatory Visit: Payer: Self-pay | Admitting: Registered Nurse

## 2021-12-14 DIAGNOSIS — R000 Tachycardia, unspecified: Secondary | ICD-10-CM | POA: Diagnosis not present

## 2021-12-15 ENCOUNTER — Telehealth: Payer: Self-pay

## 2021-12-15 DIAGNOSIS — D649 Anemia, unspecified: Secondary | ICD-10-CM | POA: Diagnosis not present

## 2021-12-15 DIAGNOSIS — R5383 Other fatigue: Secondary | ICD-10-CM | POA: Diagnosis not present

## 2021-12-15 DIAGNOSIS — R002 Palpitations: Secondary | ICD-10-CM | POA: Diagnosis not present

## 2021-12-15 DIAGNOSIS — K2970 Gastritis, unspecified, without bleeding: Secondary | ICD-10-CM | POA: Diagnosis not present

## 2021-12-15 DIAGNOSIS — R682 Dry mouth, unspecified: Secondary | ICD-10-CM | POA: Diagnosis not present

## 2021-12-15 DIAGNOSIS — R768 Other specified abnormal immunological findings in serum: Secondary | ICD-10-CM | POA: Diagnosis not present

## 2021-12-15 DIAGNOSIS — R700 Elevated erythrocyte sedimentation rate: Secondary | ICD-10-CM | POA: Diagnosis not present

## 2021-12-15 NOTE — Telephone Encounter (Signed)
?  Follow up Call- ? ?Call back number 12/13/2021  ?Post procedure Call Back phone  # [phone]  ?Permission to leave phone message Yes  ?Some recent data might be hidden  ?  ? ?Patient questions: ? ?Do you have a fever, pain , or abdominal swelling? No. ?Pain Score  0 * ? ?Have you tolerated food without any problems? Yes.   ? ?Have you been able to return to your normal activities? Yes.   ? ?Do you have any questions about your discharge instructions: ?Diet   No. ?Medications  Yes.   ?Follow up visit  Yes.   ? ?Do you have questions or concerns about your Care? Yes.   ? ?Actions: ?* If pain score is 4 or above: ?No action needed, pain <4. ? ?Have you developed a fever since your procedure? no ? ?2.   Have you had an respiratory symptoms (SOB or cough) since your procedure? no ? ?3.   Have you tested positive for COVID 19 since your procedure no ? ?4.   Have you had any family members/close contacts diagnosed with the COVID 19 since your procedure?  no ? ? ?If yes to any of these questions please route to Joylene John, RN and Joella Prince, RN  ? ? ?

## 2021-12-15 NOTE — Telephone Encounter (Signed)
The pt has been advised and will stop Pepcid and begin omeprazole OTC 20 mg daily.  She will await pathology results.  ?

## 2021-12-20 ENCOUNTER — Encounter: Payer: Self-pay | Admitting: Gastroenterology

## 2022-01-03 DIAGNOSIS — R000 Tachycardia, unspecified: Secondary | ICD-10-CM | POA: Diagnosis not present

## 2022-01-07 DIAGNOSIS — R000 Tachycardia, unspecified: Secondary | ICD-10-CM | POA: Diagnosis not present

## 2022-01-11 ENCOUNTER — Ambulatory Visit: Payer: Medicare Other | Admitting: Cardiology

## 2022-01-11 VITALS — BP 123/76 | HR 82 | Temp 97.6°F | Resp 16 | Ht 64.5 in | Wt 140.0 lb

## 2022-01-11 DIAGNOSIS — E871 Hypo-osmolality and hyponatremia: Secondary | ICD-10-CM

## 2022-01-11 DIAGNOSIS — R002 Palpitations: Secondary | ICD-10-CM

## 2022-01-11 DIAGNOSIS — R9431 Abnormal electrocardiogram [ECG] [EKG]: Secondary | ICD-10-CM | POA: Diagnosis not present

## 2022-01-11 DIAGNOSIS — R012 Other cardiac sounds: Secondary | ICD-10-CM

## 2022-01-11 DIAGNOSIS — I4729 Other ventricular tachycardia: Secondary | ICD-10-CM | POA: Diagnosis not present

## 2022-01-11 DIAGNOSIS — R0609 Other forms of dyspnea: Secondary | ICD-10-CM

## 2022-01-11 MED ORDER — PROPRANOLOL HCL ER 60 MG PO CP24: 60.0000 mg | ORAL_CAPSULE | Freq: Every day | ORAL | 3 refills | Status: DC

## 2022-01-11 NOTE — Progress Notes (Addendum)
? ?Primary Physician/Referring:  [name], FNP ? ?Patient ID: [name], female    DOB: [birth year], 71 y.o.   MRN: [id number] ? ?Chief Complaint  ?Patient presents with  ? Palpitations  ? Breathing Problem  ? ?HPI:   ? ?[name]  is a 71 y.o. Caucasian female patient  with hyperlipidemia, hypothyroidism, history of migraines, family history of early CAD, prior second hand tobacco, history of AVRNT s/p ablation with Dr. Lovena Le in 2015.  In spite of hyperlipidemia, family history of premature coronary disease, coronary calcium score in 2020 was 0 and abdominal CT in 2021 did not reveal any aortic atherosclerosis.  I had seen her for elevated blood pressure and palpitations about 2.5 years ago. ? ?Due to recurrence of palpitation, she underwent Zio patch monitoring.  Her main complaint today is frequent palpitations, but does not remind her of her prior SVT but she feels that they were precursors to SVT. ? ?She has also noticed gradually worsening dyspnea on exertion and easy fatigability.  Denies chest pain, dizziness or syncope. ? ?Past Medical History:  ?Diagnosis Date  ? Anxiety   ? AV Nodal Reentry Tachycardia   ? s/p RFCA GT 2/15  ? AVNRT (AV nodal re-entry tachycardia) ([place]) 11/06/2013  ? Cataract   ? Diverticulosis 01/2020  ? H/O radiofrequency ablation for complex left atrial arrhythmia 12/12/2013  ? Headache   ? Hemorrhoids   ? Hyperlipemia   ? Hypothyroidism   ? Mitral valve prolapse   ? ?  ? Rectal leakage   ? 1 time  ? Retinal tear of both eyes   ? ?Past Surgical History:  ?Procedure Laterality Date  ? ABLATION  11/21/2013  ? RFCA of AVNRT by Dr Lovena Le  ? BREAST LUMPECTOMY Left 2000  ? left breast-  ? CATARCT    ? RIGHT EYE IN 09/2015  ? COLONOSCOPY  2007, 2008  ? RETINAL DETACHMENT SURGERY    ? RIGHT EYE/SILICONE BUCKLE  ? RHINOPLASTY  1980  ? SEPTOPLASTY    ? 1980  ? skin lesions    ? left leg,right leg  ? SUPRAVENTRICULAR TACHYCARDIA ABLATION N/A 11/21/2013  ? Procedure:  SUPRAVENTRICULAR TACHYCARDIA ABLATION;  Surgeon: Evans Lance, MD;  Location: [facility] CATH [facility];  Service: Cardiovascular;  Laterality: N/A;  ? ?Family History  ?Problem Relation Age of Onset  ? Heart attack Mother 40  ? Pancreatic cancer Mother   ? Heart attack Father 39  ? Heart attack Sister 24  ? Heart disease Sister   ? Hernia Paternal Aunt   ? Colon cancer Paternal Uncle 46  ? Diabetes Nephew   ? Rectal cancer Neg Hx   ? Stomach cancer Neg Hx   ? Esophageal cancer Neg Hx   ?  ?Social History  ? ?Tobacco Use  ? Smoking status: Never  ? Smokeless tobacco: Never  ?Substance Use Topics  ? Alcohol use: Yes  ?  Alcohol/week: 1.0 standard drink  ?  Types: 1 Glasses of wine per week  ?  Comment: socially  ? ?Marital Status: Widowed  ?ROS  ?Review of Systems  ?Constitutional: Positive for malaise/fatigue.  ?Cardiovascular:  Positive for dyspnea on exertion and palpitations. Negative for leg swelling and syncope.  ?Gastrointestinal:  Negative for melena.  ?Objective  ?Blood pressure 123/76, pulse 82, height 5' 4.5" (1.638 m), weight 140 lb (63.5 kg).  ? ?  01/12/2022  ?  5:37 PM 12/13/2021  ?  3:40 PM 12/13/2021  ?  3:30  PM  ?Vitals with BMI  ?Height 5' 4.5"    ?Weight 140 lbs    ?BMI 23.67    ?Systolic 983 382 92  ?Diastolic 76 67 55  ?Pulse 82 71 77  ?  ? Physical Exam ?Constitutional:   ?   Appearance: She is well-developed.  ?Neck:  ?   Vascular: No JVD.  ?Cardiovascular:  ?   Rate and Rhythm: Normal rate and regular rhythm.  ?   Pulses: Normal pulses and intact distal pulses.  ?   Heart sounds: No murmur heard. ?  Gallop present. S3 sounds present.  ?Pulmonary:  ?   Effort: Pulmonary effort is normal. No accessory muscle usage or respiratory distress.  ?   Breath sounds: Normal breath sounds.  ?Abdominal:  ?   General: Bowel sounds are normal.  ?   Palpations: Abdomen is soft.  ?Musculoskeletal:  ?   Right lower leg: No edema.  ?   Left lower leg: No edema.  ? ?Laboratory examination:  ? ?External labs:  ? ?Labs  12/16/2018: ? ?Connective tissue disorder all negative except ANA positive, titer 1: 320.  Other autoimmune disease autoantibodies were negative including rheumatoid arthritis, antiphospholipid syndrome, lupus. ? ?Labs 11/01/2021: ? ?Hb 11.5/HCT 35.7, platelets 235. ? ?TSH 3.12, normal. ? ?BUN 17, creatinine 0.89, EGFR 66,, potassium 4.1.  LFTs normal. ? ?Lipid profile 04/29/2021: ? ?Total cholesterol 180, triglycerides 104, HDL 65, LDL 94.  Non-HDL cholesterol 115. ? ?  ?Medications and allergies  ? ?Allergies  ?Allergen Reactions  ? Crestor [Rosuvastatin Calcium] Other (See Comments)  ?  Muscle aches  ? Pravastatin   ?  Muscle aches ?  ?  ? ?Current Outpatient Medications:  ?  atorvastatin (LIPITOR) 20 MG tablet, Take 1 tablet by mouth every other day., Disp: , Rfl:  ?  ciclopirox (LOPROX) 0.77 % cream, Apply 1 application topically as needed., Disp: , Rfl:  ?  ciclopirox (PENLAC) 8 % solution, Apply topically as needed., Disp: , Rfl:  ?  clotrimazole (LOTRIMIN) 1 % cream, as needed., Disp: , Rfl:  ?  Cyanocobalamin (VITAMIN B12 PO), Take by mouth daily. GUMMIES, Disp: , Rfl:  ?  famotidine (PEPCID) 20 MG tablet, Take 1 tablet (20 mg total) by mouth at bedtime., Disp: 30 tablet, Rfl: 3 ?  Ferrous Sulfate (IRON) 325 (65 Fe) MG TABS, Take by mouth daily. 1/2 tablet for 4 weeks and then increase to 1 tablet, Disp: , Rfl:  ?  FLUoxetine (PROZAC) 40 MG capsule, Take 1 tablet by mouth daily., Disp: , Rfl:  ?  levothyroxine (SYNTHROID) 25 MCG tablet, Take 25 mcg by mouth daily. Take 1.5 pill daily, Disp: , Rfl:  ?  melatonin 5 MG TABS, 1.5  tablet at bedtime as needed with food, Disp: , Rfl:  ?  Multiple Vitamin (MULTIVITAMIN) tablet, Take 1 tablet by mouth daily., Disp: , Rfl:  ?  omeprazole (PRILOSEC) 20 MG capsule, as needed., Disp: , Rfl:  ?  propranolol (INDERAL) 10 MG tablet, Take 1 tablet (10 mg total) by mouth 3 (three) times daily., Disp: 90 tablet, Rfl: 1  ? ?Radiology:  ? ?CT calcium score 11/09/2018: Calcium score  0. ? ?CT angiogram  Abdomen Pelvis 01/16/2020 ?1. No abnormality to explain the patient's right lower quadrant ?abdominal pain. ?2. Pancolonic diverticulosis without CT evidence for diverticulitis. ?3. Normal appendix in the right lower quadrant. ?4. Cardiomegaly. ? ?Cardiac Studies:  ? ?GXT 07/26/2016: ?The patient exercised following the Bruce protocol.  The patient  reported no symptoms during the stress test. The patient experienced no angina during the stress test.   The patient requested the test to be stopped.   Blood pressure and heart rate demonstrated a normal response to exercise. Blood pressure demonstrated a normal response to exercise. Overall, the patient's exercise capacity was normal.   85% of maximum heart rate was achieved after 6.2 minutes. Recovery time: 5 minutes. The patient's response to exercise was adequate for diagnosis. ?Response to Stress There was no ST segment deviation noted during stress.  Arrhythmias during stress: rare PVCs.  Arrhythmias during recovery: none.  Arrhythmias were not significant.  ECG was interpretable and there was no significant change from baseline. ? ?Echocardiogram 11/14/2018: ?Left ventricle cavity is normal in size. Normal left ventricular shape. Low normal LV systolic function. Doppler evidence of grade I (impaired) diastolic dysfunction, normal LAP. Left ventricle regional wall motion findings: No wall motion abnormalities. Visual EF is 50-55%. Calculated EF 47%. ?Left atrial cavity is mildly dilated at 4.2 cm. ?Mild aortic valve leaflet thickening. Trileaflet aortic valve with no regurgitation noted. ?Mild mitral valve leaflet thickening. Mild (Grade I) mitral regurgitation. No prolapse noted. ?No evidence of significant pericardial effusion. Anterior fat pad noted. ? ?Zio Patch Extended out patient EKG monitoring 14 days starting 12/14/2021: ? Predominant rhythm is normal sinus rhythm.  Occasional episodes of first-degree AV block.  20 ventricular  tachycardia episodes occur with longest lasting 10 beats and 36 atrial tachycardia episodes, longest 12.5 seconds.  Review of the EKG suggests atrial tachycardia with aberrancy however there were 2 episodes of NSVT, 6 beat and 3 b

## 2022-01-11 NOTE — Patient Instructions (Signed)
I have prescribed you propranolol LA 60 mg 1 capsule every day in the evening. ? ?You have been set up for coronary CT angiogram to look for any blockages in your heart and other abnormalities. ? ?1 day before the test, I would like you to take 2 capsules of Inderal LA/propranolol LA in the evening, and on the day of the test I want you to take 2 tablets prior to your CT scan. ?

## 2022-01-12 ENCOUNTER — Telehealth: Payer: Self-pay

## 2022-01-12 ENCOUNTER — Encounter: Payer: Self-pay | Admitting: Cardiology

## 2022-01-12 DIAGNOSIS — R002 Palpitations: Secondary | ICD-10-CM | POA: Diagnosis not present

## 2022-01-12 DIAGNOSIS — R5383 Other fatigue: Secondary | ICD-10-CM | POA: Diagnosis not present

## 2022-01-12 DIAGNOSIS — D649 Anemia, unspecified: Secondary | ICD-10-CM | POA: Diagnosis not present

## 2022-01-12 DIAGNOSIS — K2970 Gastritis, unspecified, without bleeding: Secondary | ICD-10-CM | POA: Diagnosis not present

## 2022-01-12 DIAGNOSIS — R700 Elevated erythrocyte sedimentation rate: Secondary | ICD-10-CM | POA: Diagnosis not present

## 2022-01-12 DIAGNOSIS — R682 Dry mouth, unspecified: Secondary | ICD-10-CM | POA: Diagnosis not present

## 2022-01-12 DIAGNOSIS — R768 Other specified abnormal immunological findings in serum: Secondary | ICD-10-CM | POA: Diagnosis not present

## 2022-01-12 MED ORDER — PROPRANOLOL HCL 10 MG PO TABS: 10.0000 mg | ORAL_TABLET | Freq: Three times a day (TID) | ORAL | 1 refills | Status: DC

## 2022-01-12 NOTE — Telephone Encounter (Signed)
Pt called and stated that she tried the propranolol 60 mg. She is feeling light headed and dizzy. Pt is at PCP right now and having them take her to her car in a wheel chair. Please advise.  ?

## 2022-01-13 ENCOUNTER — Encounter: Payer: Self-pay | Admitting: Cardiology

## 2022-01-14 ENCOUNTER — Telehealth: Payer: Self-pay | Admitting: Gastroenterology

## 2022-01-14 ENCOUNTER — Encounter: Payer: Self-pay | Admitting: Cardiology

## 2022-01-14 NOTE — Telephone Encounter (Signed)
The pt states the OTC omeprazole states do not take more than 14 days.  She was advised that she can continue as needed.  She will take the omeprazole and per Dr Ardis Hughs instructions.   ?

## 2022-01-14 NOTE — Telephone Encounter (Signed)
Inbound call from patient requesting a call back a medication that Dr. Ardis Hughs proscribed. Please advise.  ?

## 2022-01-15 NOTE — Addendum Note (Signed)
Addended by: Kela Millin on: 01/15/2022 09:34 AM ? ? Modules accepted: Orders ? ?

## 2022-01-17 ENCOUNTER — Encounter: Payer: Self-pay | Admitting: Cardiology

## 2022-01-17 DIAGNOSIS — F419 Anxiety disorder, unspecified: Secondary | ICD-10-CM | POA: Diagnosis not present

## 2022-01-17 DIAGNOSIS — Z8719 Personal history of other diseases of the digestive system: Secondary | ICD-10-CM | POA: Diagnosis not present

## 2022-01-17 NOTE — Telephone Encounter (Signed)
From patient.

## 2022-01-18 ENCOUNTER — Ambulatory Visit: Payer: Medicare Other

## 2022-01-18 DIAGNOSIS — I4729 Other ventricular tachycardia: Secondary | ICD-10-CM | POA: Diagnosis not present

## 2022-01-18 DIAGNOSIS — R0609 Other forms of dyspnea: Secondary | ICD-10-CM

## 2022-01-19 DIAGNOSIS — I4729 Other ventricular tachycardia: Secondary | ICD-10-CM | POA: Diagnosis not present

## 2022-01-20 LAB — BASIC METABOLIC PANEL
BUN/Creatinine Ratio: 17 (ref 12–28)
BUN: 14 mg/dL (ref 8–27)
CO2: 19 mmol/L — ABNORMAL LOW (ref 20–29)
Calcium: 8.9 mg/dL (ref 8.7–10.3)
Chloride: 92 mmol/L — ABNORMAL LOW (ref 96–106)
Creatinine, Ser: 0.84 mg/dL (ref 0.57–1.00)
Glucose: 94 mg/dL (ref 70–99)
Potassium: 3.4 mmol/L — ABNORMAL LOW (ref 3.5–5.2)
Sodium: 125 mmol/L — ABNORMAL LOW (ref 134–144)
eGFR: 75 mL/min/{1.73_m2} (ref >59)

## 2022-01-20 LAB — MAGNESIUM: Magnesium: 1.7 mg/dL (ref 1.6–2.3)

## 2022-01-20 NOTE — Addendum Note (Signed)
Addended by: Kela Millin on: 01/20/2022 06:26 PM ? ? Modules accepted: Orders ? ?

## 2022-01-21 ENCOUNTER — Encounter: Payer: Self-pay | Admitting: Cardiology

## 2022-01-21 DIAGNOSIS — E871 Hypo-osmolality and hyponatremia: Secondary | ICD-10-CM | POA: Diagnosis not present

## 2022-01-24 NOTE — Telephone Encounter (Signed)
From patient.

## 2022-01-25 ENCOUNTER — Other Ambulatory Visit: Payer: Self-pay

## 2022-01-25 ENCOUNTER — Telehealth: Payer: Self-pay | Admitting: Cardiology

## 2022-01-25 ENCOUNTER — Telehealth (HOSPITAL_COMMUNITY): Payer: Self-pay | Admitting: *Deleted

## 2022-01-25 ENCOUNTER — Emergency Department (HOSPITAL_BASED_OUTPATIENT_CLINIC_OR_DEPARTMENT_OTHER)
Admission: EM | Admit: 2022-01-25 | Discharge: 2022-01-25 | Disposition: A | Discharge status: Home / Self Care | Payer: Medicare Other | Source: Home / Self Care | Attending: Emergency Medicine | Admitting: Emergency Medicine

## 2022-01-25 ENCOUNTER — Encounter (HOSPITAL_BASED_OUTPATIENT_CLINIC_OR_DEPARTMENT_OTHER): Payer: Self-pay

## 2022-01-25 DIAGNOSIS — Z800 Family history of malignant neoplasm of digestive organs: Secondary | ICD-10-CM | POA: Diagnosis not present

## 2022-01-25 DIAGNOSIS — R5383 Other fatigue: Secondary | ICD-10-CM | POA: Diagnosis present

## 2022-01-25 DIAGNOSIS — E876 Hypokalemia: Secondary | ICD-10-CM | POA: Diagnosis not present

## 2022-01-25 DIAGNOSIS — E785 Hyperlipidemia, unspecified: Secondary | ICD-10-CM | POA: Diagnosis present

## 2022-01-25 DIAGNOSIS — I341 Nonrheumatic mitral (valve) prolapse: Secondary | ICD-10-CM | POA: Diagnosis present

## 2022-01-25 DIAGNOSIS — E871 Hypo-osmolality and hyponatremia: Secondary | ICD-10-CM | POA: Diagnosis not present

## 2022-01-25 DIAGNOSIS — K219 Gastro-esophageal reflux disease without esophagitis: Secondary | ICD-10-CM | POA: Diagnosis present

## 2022-01-25 DIAGNOSIS — E222 Syndrome of inappropriate secretion of antidiuretic hormone: Secondary | ICD-10-CM | POA: Diagnosis not present

## 2022-01-25 DIAGNOSIS — E119 Type 2 diabetes mellitus without complications: Secondary | ICD-10-CM | POA: Diagnosis present

## 2022-01-25 DIAGNOSIS — Z888 Allergy status to other drugs, medicaments and biological substances status: Secondary | ICD-10-CM | POA: Diagnosis not present

## 2022-01-25 DIAGNOSIS — J90 Pleural effusion, not elsewhere classified: Secondary | ICD-10-CM | POA: Diagnosis not present

## 2022-01-25 DIAGNOSIS — R0602 Shortness of breath: Secondary | ICD-10-CM | POA: Insufficient documentation

## 2022-01-25 DIAGNOSIS — I5033 Acute on chronic diastolic (congestive) heart failure: Secondary | ICD-10-CM | POA: Diagnosis present

## 2022-01-25 DIAGNOSIS — Z8249 Family history of ischemic heart disease and other diseases of the circulatory system: Secondary | ICD-10-CM | POA: Diagnosis not present

## 2022-01-25 DIAGNOSIS — I10 Essential (primary) hypertension: Secondary | ICD-10-CM

## 2022-01-25 DIAGNOSIS — I509 Heart failure, unspecified: Secondary | ICD-10-CM | POA: Diagnosis not present

## 2022-01-25 DIAGNOSIS — R002 Palpitations: Secondary | ICD-10-CM

## 2022-01-25 DIAGNOSIS — E872 Acidosis, unspecified: Secondary | ICD-10-CM | POA: Diagnosis not present

## 2022-01-25 DIAGNOSIS — Z833 Family history of diabetes mellitus: Secondary | ICD-10-CM | POA: Diagnosis not present

## 2022-01-25 DIAGNOSIS — I5032 Chronic diastolic (congestive) heart failure: Secondary | ICD-10-CM | POA: Diagnosis not present

## 2022-01-25 DIAGNOSIS — D649 Anemia, unspecified: Secondary | ICD-10-CM | POA: Diagnosis not present

## 2022-01-25 DIAGNOSIS — I110 Hypertensive heart disease with heart failure: Secondary | ICD-10-CM | POA: Diagnosis not present

## 2022-01-25 DIAGNOSIS — E039 Hypothyroidism, unspecified: Secondary | ICD-10-CM | POA: Insufficient documentation

## 2022-01-25 DIAGNOSIS — I517 Cardiomegaly: Secondary | ICD-10-CM | POA: Diagnosis not present

## 2022-01-25 DIAGNOSIS — Z79890 Hormone replacement therapy (postmenopausal): Secondary | ICD-10-CM | POA: Diagnosis not present

## 2022-01-25 DIAGNOSIS — Z79899 Other long term (current) drug therapy: Secondary | ICD-10-CM | POA: Diagnosis not present

## 2022-01-25 DIAGNOSIS — F419 Anxiety disorder, unspecified: Secondary | ICD-10-CM | POA: Diagnosis present

## 2022-01-25 DIAGNOSIS — R531 Weakness: Secondary | ICD-10-CM | POA: Diagnosis not present

## 2022-01-25 DIAGNOSIS — J811 Chronic pulmonary edema: Secondary | ICD-10-CM | POA: Diagnosis not present

## 2022-01-25 LAB — CBC WITH DIFFERENTIAL/PLATELET
Abs Immature Granulocytes: 0.02 10*3/uL (ref 0.00–0.07)
Basophils Absolute: 0 10*3/uL (ref 0.0–0.1)
Basophils Relative: 0 %
Eosinophils Absolute: 0 10*3/uL (ref 0.0–0.5)
Eosinophils Relative: 1 %
HCT: 33.8 % — ABNORMAL LOW (ref 36.0–46.0)
Hemoglobin: 11.3 g/dL — ABNORMAL LOW (ref 12.0–15.0)
Immature Granulocytes: 0 %
Lymphocytes Relative: 22 %
Lymphs Abs: 1.5 10*3/uL (ref 0.7–4.0)
MCH: 27 pg (ref 26.0–34.0)
MCHC: 33.4 g/dL (ref 30.0–36.0)
MCV: 80.9 fL (ref 80.0–100.0)
Monocytes Absolute: 0.4 10*3/uL (ref 0.1–1.0)
Monocytes Relative: 7 %
Neutro Abs: 4.7 10*3/uL (ref 1.7–7.7)
Neutrophils Relative %: 70 %
Platelets: 273 10*3/uL (ref 150–400)
RBC: 4.18 MIL/uL (ref 3.87–5.11)
RDW: 13.6 % (ref 11.5–15.5)
WBC: 6.7 10*3/uL (ref 4.0–10.5)
nRBC: 0 % (ref 0.0–0.2)

## 2022-01-25 LAB — COMPREHENSIVE METABOLIC PANEL
ALT: 34 U/L (ref 0–44)
AST: 26 U/L (ref 15–41)
Albumin: 4.1 g/dL (ref 3.5–5.0)
Alkaline Phosphatase: 87 U/L (ref 38–126)
Anion gap: 12 (ref 5–15)
BUN: 17 mg/dL (ref 8–23)
CO2: 21 mmol/L — ABNORMAL LOW (ref 22–32)
Calcium: 9.2 mg/dL (ref 8.9–10.3)
Chloride: 93 mmol/L — ABNORMAL LOW (ref 98–111)
Creatinine, Ser: 0.69 mg/dL (ref 0.44–1.00)
GFR, Estimated: >60 mL/min (ref >60)
Glucose, Bld: 115 mg/dL — ABNORMAL HIGH (ref 70–99)
Potassium: 3.2 mmol/L — ABNORMAL LOW (ref 3.5–5.1)
Sodium: 126 mmol/L — ABNORMAL LOW (ref 135–145)
Total Bilirubin: 0.7 mg/dL (ref 0.3–1.2)
Total Protein: 7.3 g/dL (ref 6.5–8.1)

## 2022-01-25 LAB — CMP14+EGFR
ALT: 24 IU/L (ref 0–32)
AST: 15 IU/L (ref 0–40)
Albumin/Globulin Ratio: 1.5 (ref 1.2–2.2)
Albumin: 3.7 g/dL — ABNORMAL LOW (ref 3.8–4.8)
Alkaline Phosphatase: 99 IU/L (ref 44–121)
BUN/Creatinine Ratio: 14 (ref 12–28)
BUN: 10 mg/dL (ref 8–27)
Bilirubin Total: 0.3 mg/dL (ref 0.0–1.2)
CO2: 18 mmol/L — ABNORMAL LOW (ref 20–29)
Calcium: 9 mg/dL (ref 8.7–10.3)
Chloride: 100 mmol/L (ref 96–106)
Creatinine, Ser: 0.72 mg/dL (ref 0.57–1.00)
Globulin, Total: 2.4 g/dL (ref 1.5–4.5)
Glucose: 100 mg/dL — ABNORMAL HIGH (ref 70–99)
Potassium: 3.6 mmol/L (ref 3.5–5.2)
Sodium: 133 mmol/L — ABNORMAL LOW (ref 134–144)
Total Protein: 6.1 g/dL (ref 6.0–8.5)
eGFR: 90 mL/min/{1.73_m2} (ref >59)

## 2022-01-25 LAB — TSH: TSH: 6.406 u[IU]/mL — ABNORMAL HIGH (ref 0.350–4.500)

## 2022-01-25 LAB — SODIUM, URINE, RANDOM: Sodium, Ur: 94 mmol/L

## 2022-01-25 LAB — URINALYSIS
Bilirubin, UA: NEGATIVE
Glucose, UA: NEGATIVE
Ketones, UA: NEGATIVE
Leukocytes,UA: NEGATIVE
Nitrite, UA: NEGATIVE
Specific Gravity, UA: 1.018 (ref 1.005–1.030)
Urobilinogen, Ur: 0.2 mg/dL (ref 0.2–1.0)
pH, UA: 5.5 (ref 5.0–7.5)

## 2022-01-25 LAB — OSMOLALITY: Osmolality Meas: 275 mOsmol/kg — ABNORMAL LOW (ref 280–301)

## 2022-01-25 LAB — MAGNESIUM: Magnesium: 1.7 mg/dL (ref 1.7–2.4)

## 2022-01-25 MED ORDER — POTASSIUM CHLORIDE CRYS ER 20 MEQ PO TBCR
40.0000 meq | EXTENDED_RELEASE_TABLET | Freq: Once | ORAL | Status: AC
  Administered 2022-01-25: 40 meq via ORAL
  Filled 2022-01-25: qty 2

## 2022-01-25 MED ORDER — SODIUM CHLORIDE 0.9 % IV BOLUS
1000.0000 mL | Freq: Once | INTRAVENOUS | Status: AC
Start: 2022-01-25 — End: 2022-01-25
  Administered 2022-01-25: 1000 mL via INTRAVENOUS

## 2022-01-25 NOTE — Telephone Encounter (Addendum)
Patient had called and spoken to [facility], PA-C this afternoon, patient extremely complex presentation with sudden onset of shortness of breath, occasional episodes of palpitations, mild fatigue, history is so difficult to make out and these are all paroxysmal and episodic and she has also noticed elevated blood pressure at this time. ? ?She is on minimal dose of beta-blocker, propanolol, do not think her symptoms of marked fatigue and sudden onset shortness of breath and not feeling well and a sense of impending doom can be explained by this. ? ?Her symptoms are occurring in paroxysms, anywhere lasting 10 minutes to sometimes 30 minutes to 40 minutes. ? ?She is scheduled for coronary CT angiogram tomorrow however she is tachycardic today elevated blood pressure, does not feel well, hence will request to postpone her coronary CTA. ? ?I reviewed her echocardiogram, she does have mild cardiomyopathy that has been stable with mild decrease in LVEF of unknown etiology around 45 to 50%.  Coronary CTA will certainly help with this, we may consider doing cardiac MRI. ? ?For now I would like to evaluate her for pheochromocytoma.  Orders have been placed. ? ?I also again reviewed the ED visit.  Her labs including sodium levels are normalized, it is related to excessive fluid intake previously.  No signs of SIADH. ? ?With regard to elevated heart rate, palpitations, hypertension, patient also thinks that propranolol is not helping and she feels more of the symptoms with the medication, will discontinue this and switch her to diltiazem CD1 80 mg daily. ? ?Her son is also present, he is in the background and all questions answered.  This was a 20-minute telephone encounter. ? ?  ICD-10-CM   ?1. Palpitations  R00.2 5 HIAA, quantitative, urine, 24 hour  ?  Aldosterone + renin activity w/ ratio  ?  Metanephrines, urine, 24 hour  ?  diltiazem (CARDIZEM CD) 180 MG 24 hr capsule  ?  ?2. Paroxysmal hypertension  I10 5 HIAA,  quantitative, urine, 24 hour  ?  Aldosterone + renin activity w/ ratio  ?  Metanephrines, urine, 24 hour  ?  diltiazem (CARDIZEM CD) 180 MG 24 hr capsule  ?  ? ?Medications Discontinued During This Encounter  ?Medication Reason  ? propranolol (INDERAL) 10 MG tablet Change in therapy  ?  ?Meds ordered this encounter  ?Medications  ? diltiazem (CARDIZEM CD) 180 MG 24 hr capsule  ?  Sig: Take 1 capsule (180 mg total) by mouth daily.  ?  Dispense:  30 capsule  ?  Refill:  2  ?  ?Orders Placed This Encounter  ?Procedures  ? 5 HIAA, quantitative, urine, 24 hour  ? Aldosterone + renin activity w/ ratio  ? Metanephrines, urine, 24 hour  ?  ?

## 2022-01-25 NOTE — Discharge Instructions (Addendum)
Follow back up with your primary care doctor.  Also gave you information about [place] [facility] may want to run it through your primary care doctor but they may be able to do some additional kidney analysis to determine ?Your sodiums are trending low and why your potassium is a little low. ? ?Your appointment with cardiology as scheduled for tomorrow.  For your CT of the chest. ? ?Thyroid stimulating hormone was ordered today.  Not back yet.  He can follow that up on MyChart. ?

## 2022-01-25 NOTE — Telephone Encounter (Signed)
Reaching out to patient to offer assistance regarding upcoming cardiac imaging study; pt verbalizes understanding of appt date/time, parking situation and where to check in, pre-test NPO status  and verified current allergies; name and call back number provided for further questions should they arise ? ?Gordy Clement RN Navigator Cardiac Imaging ?[facility] ?[phone] office ?[phone] cell ? ?Patient to arrive at 11:30am for her cardiac CT scan. ?

## 2022-01-25 NOTE — ED Triage Notes (Signed)
Worsening fatigue over last several weeks. Becomes very weak when walking short distances. ?

## 2022-01-25 NOTE — ED Provider Notes (Addendum)
[facility] EMERGENCY DEPT Provider Note   CSN: [id number] Arrival date & time: 01/25/22  7829     History  Chief Complaint  Patient presents with   Fatigue    [name] is a 71 y.o. female.  Patient here today with concerns of worsening fatigue some shortness of breath with walking short distances.  This has been ongoing for a few months but getting worse in the last few days.  Followed by [facility] and also followed by Dr. Jacinto Halim from cardiology.  Has been extensive work-ups to include evaluation by rheumatology without any good explanation.  Since mostly been concerned she was told that her sodium was low at 125 on April 5.  Her cardiologist repeated the labs on April 7.  But she does not know the results of that.  Patient is scheduled for CT angio of the coronary arteries tomorrow by her cardiologist.  Patient has been trying to get answers to her fatigue and generalized weakness for a while now without any good explanation.  Patient's past medical history significant for hypothyroidism AV nodal reentry tachycardia mitral valve prolapse hyperlipidemia and radiofrequency ablation for complex left atrial arrhythmias.  Diverticulosis.  Past surgical history significant for breast lumpectomy in 2000 and the supraventricular tachycardia ablation in 2015.      Home Medications Prior to Admission medications   Medication Sig Start Date End Date Taking? Authorizing Provider  atorvastatin (LIPITOR) 20 MG tablet Take 1 tablet by mouth every other day.    [provider]  ciclopirox (LOPROX) 0.77 % cream Apply 1 application topically as needed. 05/12/21   [provider]  ciclopirox (PENLAC) 8 % solution Apply topically as needed. 06/10/21   [provider]  clotrimazole (LOTRIMIN) 1 % cream as needed. 08/13/18   [provider]  Cyanocobalamin (VITAMIN B12 PO) Take by mouth daily. GUMMIES    [provider]   famotidine (PEPCID) 20 MG tablet Take 1 tablet (20 mg total) by mouth at bedtime. 11/05/21   Rachael Fee, MD  Ferrous Sulfate (IRON) 325 (65 Fe) MG TABS Take by mouth daily. 1/2 tablet for 4 weeks and then increase to 1 tablet    [provider]  FLUoxetine (PROZAC) 40 MG capsule Take 1 tablet by mouth daily.    [provider]  levothyroxine (SYNTHROID) 25 MCG tablet Take 25 mcg by mouth daily. Take 1.5 pill daily    [provider]  melatonin 5 MG TABS 1.5  tablet at bedtime as needed with food    [provider]  Multiple Vitamin (MULTIVITAMIN) tablet Take 1 tablet by mouth daily.    [provider]  omeprazole (PRILOSEC) 20 MG capsule as needed. 09/07/20   [provider]  propranolol (INDERAL) 10 MG tablet Take 1 tablet (10 mg total) by mouth 3 (three) times daily. 01/12/22   Yates Decamp, MD      Allergies    Crestor [rosuvastatin calcium] and Pravastatin    Review of Systems   Review of Systems  Constitutional:  Positive for fatigue. Negative for chills and fever.  HENT:  Negative for ear pain and sore throat.   Eyes:  Negative for pain and visual disturbance.  Respiratory:  Positive for shortness of breath. Negative for cough.   Cardiovascular:  Negative for chest pain, palpitations and leg swelling.  Gastrointestinal:  Negative for abdominal pain and vomiting.  Genitourinary:  Negative for dysuria and hematuria.  Musculoskeletal:  Negative for arthralgias and  back pain.  Skin:  Negative for color change and rash.  Neurological:  Negative for seizures and syncope.  All other systems reviewed and are negative.  Physical Exam Updated Vital Signs BP (!) 148/106   Pulse 86   Temp 98.1 F (36.7 C)   Resp 16   Ht 1.645 m (5' 4.75")   Wt 62.6 kg   SpO2 100%   BMI 23.14 kg/m  Physical Exam Vitals and nursing note reviewed.  Constitutional:      General: She is not in acute distress.    Appearance: Normal appearance.  She is well-developed.  HENT:     Head: Normocephalic and atraumatic.     Mouth/Throat:     Mouth: Mucous membranes are moist.  Eyes:     Extraocular Movements: Extraocular movements intact.     Conjunctiva/sclera: Conjunctivae normal.     Pupils: Pupils are equal, round, and reactive to light.  Cardiovascular:     Rate and Rhythm: Normal rate and regular rhythm.     Heart sounds: No murmur heard. Pulmonary:     Effort: Pulmonary effort is normal. No respiratory distress.     Breath sounds: Normal breath sounds. No wheezing or rales.  Abdominal:     Palpations: Abdomen is soft.     Tenderness: There is no abdominal tenderness.  Musculoskeletal:        General: No swelling.     Cervical back: Normal range of motion and neck supple.     Right lower leg: No edema.     Left lower leg: No edema.  Skin:    General: Skin is warm and dry.     Capillary Refill: Capillary refill takes less than 2 seconds.  Neurological:     General: No focal deficit present.     Mental Status: She is alert and oriented to person, place, and time.  Psychiatric:        Mood and Affect: Mood normal.    ED Results / Procedures / Treatments   Labs (all labs ordered are listed, but only abnormal results are displayed) Labs Reviewed  CBC WITH DIFFERENTIAL/PLATELET - Abnormal; Notable for the following components:      Result Value   Hemoglobin 11.3 (*)    HCT 33.8 (*)    All other components within normal limits  COMPREHENSIVE METABOLIC PANEL - Abnormal; Notable for the following components:   Sodium 126 (*)    Potassium 3.2 (*)    Chloride 93 (*)    CO2 21 (*)    Glucose, Bld 115 (*)    All other components within normal limits  MAGNESIUM  TSH    EKG EKG Interpretation  Date/Time:  Tuesday January 25 2022 06:54:33 EDT Ventricular Rate:  95 PR Interval:  200 QRS Duration: 80 QT Interval:  406 QTC Calculation: 510 R Axis:   62 Text Interpretation: Normal sinus rhythm Nonspecific T wave  abnormality Abnormal ECG When compared with ECG of 22-Nov-2013 04:39, Vent. rate has increased BY  32 BPM Questionable change in QRS axis Non-specific change in ST segment in Inferior leads Nonspecific T wave abnormality now evident in Inferior leads Nonspecific T wave abnormality now evident in Anterior leads Confirmed by Vanetta Mulders [phone]) on 01/25/2022 10:04:27 AM  Radiology No results found.  Procedures Procedures    Medications Ordered in ED Medications  sodium chloride 0.9 % bolus 1,000 mL (has no administration in time range)  potassium chloride SA (KLOR-CON M) CR tablet 40 mEq (has no  administration in time range)    ED Course/ Medical Decision Making/ A&P                           Medical Decision Making Amount and/or Complexity of Data Reviewed Labs: ordered.  Risk Prescription drug management.   Work-up here today shows that sodium is 126 potassium 3.2 so still a little bit low kidney function is normal at GFR greater than 60 and the CO2 is at 21.  Still a mild acidosis.  Patient did not want a chest x-ray.  Her oxygen sats here are 99% heart rate 93.  I did mention that may be blood clot in the lungs but she does not want to pursue that.  Certainly in no extremis at rest.  She did agree to have the labs done.  TSH is pending magnesium also was normal at 1.7 CBC is normal with no leukocytosis hemoglobin 11.3 complete metabolic panel we talked about the sodium being low the chloride being low potassium a little low and CO2 being a little low blood sugar was good liver function tests were normal.  Patient is in agreement to having some normal saline IV here to see if it makes her feel better this would be temporary patient's med review does not seem to have her on any medication that necessarily could drop these electrolytes.  Also will give her 40 mill equivalent potassium p.o.  She will follow back up with her primary care doctor I will give her referral to nephrology  perhaps the electrolyte abnormalities are tied into the kidneys.  And patient will have her CT coronary artery angiogram as scheduled tomorrow follow back up with her cardiologist.  Final Clinical Impression(s) / ED Diagnoses Final diagnoses:  Hyponatremia  Hypokalemia    Rx / DC Orders ED Discharge Orders     None         Vanetta Mulders, MD 01/25/22 1042    Vanetta Mulders, MD 01/25/22 1156

## 2022-01-25 NOTE — ED Notes (Signed)
Patient verbalizes understanding of discharge instructions. Opportunity for questioning and answers were provided. Patient discharged from ED.  °

## 2022-01-26 ENCOUNTER — Encounter (HOSPITAL_COMMUNITY): Payer: Self-pay

## 2022-01-26 ENCOUNTER — Ambulatory Visit (HOSPITAL_COMMUNITY): Admission: RE | Admit: 2022-01-26 | Payer: Medicare Other | Source: Ambulatory Visit

## 2022-01-26 ENCOUNTER — Encounter: Payer: Self-pay | Admitting: Cardiology

## 2022-01-26 MED ORDER — DILTIAZEM HCL ER COATED BEADS 180 MG PO CP24: 180.0000 mg | ORAL_CAPSULE | Freq: Every day | ORAL | 2 refills | Status: DC

## 2022-01-26 NOTE — Addendum Note (Signed)
Addended by: Kela Millin on: 01/26/2022 01:12 PM ? ? Modules accepted: Orders ? ?

## 2022-01-27 ENCOUNTER — Emergency Department (HOSPITAL_BASED_OUTPATIENT_CLINIC_OR_DEPARTMENT_OTHER): Payer: Medicare Other

## 2022-01-27 ENCOUNTER — Inpatient Hospital Stay (HOSPITAL_BASED_OUTPATIENT_CLINIC_OR_DEPARTMENT_OTHER)
Admission: EM | Admit: 2022-01-27 | Discharge: 2022-01-29 | DRG: 643 | Disposition: A | Discharge status: Home / Self Care | Payer: Medicare Other | Attending: Internal Medicine | Admitting: Internal Medicine

## 2022-01-27 ENCOUNTER — Other Ambulatory Visit: Payer: Self-pay

## 2022-01-27 ENCOUNTER — Encounter (HOSPITAL_BASED_OUTPATIENT_CLINIC_OR_DEPARTMENT_OTHER): Payer: Self-pay

## 2022-01-27 DIAGNOSIS — E222 Syndrome of inappropriate secretion of antidiuretic hormone: Principal | ICD-10-CM | POA: Diagnosis present

## 2022-01-27 DIAGNOSIS — J811 Chronic pulmonary edema: Secondary | ICD-10-CM | POA: Diagnosis not present

## 2022-01-27 DIAGNOSIS — E783 Hyperchylomicronemia: Secondary | ICD-10-CM | POA: Diagnosis present

## 2022-01-27 DIAGNOSIS — Z833 Family history of diabetes mellitus: Secondary | ICD-10-CM

## 2022-01-27 DIAGNOSIS — K219 Gastro-esophageal reflux disease without esophagitis: Secondary | ICD-10-CM | POA: Diagnosis present

## 2022-01-27 DIAGNOSIS — Z888 Allergy status to other drugs, medicaments and biological substances status: Secondary | ICD-10-CM

## 2022-01-27 DIAGNOSIS — I110 Hypertensive heart disease with heart failure: Secondary | ICD-10-CM | POA: Diagnosis present

## 2022-01-27 DIAGNOSIS — Z8249 Family history of ischemic heart disease and other diseases of the circulatory system: Secondary | ICD-10-CM

## 2022-01-27 DIAGNOSIS — I517 Cardiomegaly: Secondary | ICD-10-CM | POA: Diagnosis not present

## 2022-01-27 DIAGNOSIS — D649 Anemia, unspecified: Secondary | ICD-10-CM | POA: Diagnosis present

## 2022-01-27 DIAGNOSIS — I5033 Acute on chronic diastolic (congestive) heart failure: Secondary | ICD-10-CM | POA: Diagnosis present

## 2022-01-27 DIAGNOSIS — F419 Anxiety disorder, unspecified: Secondary | ICD-10-CM | POA: Diagnosis present

## 2022-01-27 DIAGNOSIS — E872 Acidosis, unspecified: Secondary | ICD-10-CM | POA: Diagnosis present

## 2022-01-27 DIAGNOSIS — E871 Hypo-osmolality and hyponatremia: Secondary | ICD-10-CM | POA: Diagnosis not present

## 2022-01-27 DIAGNOSIS — J90 Pleural effusion, not elsewhere classified: Secondary | ICD-10-CM | POA: Diagnosis not present

## 2022-01-27 DIAGNOSIS — R531 Weakness: Secondary | ICD-10-CM | POA: Diagnosis not present

## 2022-01-27 DIAGNOSIS — Z79890 Hormone replacement therapy (postmenopausal): Secondary | ICD-10-CM

## 2022-01-27 DIAGNOSIS — I509 Heart failure, unspecified: Secondary | ICD-10-CM | POA: Diagnosis not present

## 2022-01-27 DIAGNOSIS — I5032 Chronic diastolic (congestive) heart failure: Secondary | ICD-10-CM | POA: Diagnosis present

## 2022-01-27 DIAGNOSIS — I341 Nonrheumatic mitral (valve) prolapse: Secondary | ICD-10-CM | POA: Diagnosis present

## 2022-01-27 DIAGNOSIS — Z800 Family history of malignant neoplasm of digestive organs: Secondary | ICD-10-CM

## 2022-01-27 DIAGNOSIS — E039 Hypothyroidism, unspecified: Secondary | ICD-10-CM | POA: Diagnosis present

## 2022-01-27 DIAGNOSIS — R002 Palpitations: Secondary | ICD-10-CM

## 2022-01-27 DIAGNOSIS — Z79899 Other long term (current) drug therapy: Secondary | ICD-10-CM

## 2022-01-27 DIAGNOSIS — E119 Type 2 diabetes mellitus without complications: Secondary | ICD-10-CM | POA: Diagnosis present

## 2022-01-27 DIAGNOSIS — E876 Hypokalemia: Secondary | ICD-10-CM | POA: Diagnosis present

## 2022-01-27 DIAGNOSIS — E785 Hyperlipidemia, unspecified: Secondary | ICD-10-CM | POA: Diagnosis present

## 2022-01-27 DIAGNOSIS — I10 Essential (primary) hypertension: Secondary | ICD-10-CM

## 2022-01-27 LAB — CBC
HCT: 32.8 % — ABNORMAL LOW (ref 36.0–46.0)
Hemoglobin: 10.9 g/dL — ABNORMAL LOW (ref 12.0–15.0)
MCH: 26.8 pg (ref 26.0–34.0)
MCHC: 33.2 g/dL (ref 30.0–36.0)
MCV: 80.6 fL (ref 80.0–100.0)
Platelets: 315 10*3/uL (ref 150–400)
RBC: 4.07 MIL/uL (ref 3.87–5.11)
RDW: 13.6 % (ref 11.5–15.5)
WBC: 7.2 10*3/uL (ref 4.0–10.5)
nRBC: 0 % (ref 0.0–0.2)

## 2022-01-27 LAB — BASIC METABOLIC PANEL
Anion gap: 11 (ref 5–15)
BUN: 17 mg/dL (ref 8–23)
CO2: 21 mmol/L — ABNORMAL LOW (ref 22–32)
Calcium: 9.4 mg/dL (ref 8.9–10.3)
Chloride: 93 mmol/L — ABNORMAL LOW (ref 98–111)
Creatinine, Ser: 0.69 mg/dL (ref 0.44–1.00)
GFR, Estimated: >60 mL/min (ref >60)
Glucose, Bld: 122 mg/dL — ABNORMAL HIGH (ref 70–99)
Potassium: 3.2 mmol/L — ABNORMAL LOW (ref 3.5–5.1)
Sodium: 125 mmol/L — ABNORMAL LOW (ref 135–145)

## 2022-01-27 MED ORDER — SODIUM CHLORIDE 0.9 % IV BOLUS
1000.0000 mL | Freq: Once | INTRAVENOUS | Status: AC
  Administered 2022-01-27: 1000 mL via INTRAVENOUS

## 2022-01-27 NOTE — ED Provider Notes (Signed)
?[facility] EMERGENCY DEPT ?Provider Note ? ? ?CSN: [id number] ?Arrival date & time: 01/27/22  1903 ? ?  ? ?History ? ?Chief Complaint  ?Patient presents with  ? Weakness  ? ? ?[name] is a 71 y.o. female. ? ?Patient is a 71 year old female with past medical history of hyperlipidemia, hypertension, GERD, hypothyroidism.  Patient presenting today with complaints of generalized weakness.  This has been ongoing for several weeks.  She has had work-up into this, however no definitive cause has been found.  She has been hyponatremic with sodium as low as 125.  She denies any chest pain, but does describe shortness of breath with exertion.  She reports being short of breath and severely weak with just ambulating to the mailbox. ? ?The history is provided by the patient.  ?Weakness ?Severity:  Moderate ?Onset quality:  Gradual ?Duration:  3 weeks ?Timing:  Constant ?Progression:  Worsening ?Relieved by:  Nothing ?Worsened by:  Activity ?Ineffective treatments:  None tried ? ?  ? ?Home Medications ?Prior to Admission medications   ?Medication Sig Start Date End Date Taking? Authorizing Provider  ?atorvastatin (LIPITOR) 20 MG tablet Take 1 tablet by mouth every other day.    [provider]  ?ciclopirox (LOPROX) 0.77 % cream Apply 1 application topically as needed. 05/12/21   [provider]  ?ciclopirox (PENLAC) 8 % solution Apply topically as needed. 06/10/21   [provider]  ?clotrimazole (LOTRIMIN) 1 % cream as needed. 08/13/18   [provider]  ?Cyanocobalamin (VITAMIN B12 PO) Take by mouth daily. [place]    [provider]  ?diltiazem (CARDIZEM CD) 180 MG 24 hr capsule Take 1 capsule (180 mg total) by mouth daily. 01/26/22 04/26/22  Adrian Prows, MD  ?famotidine (PEPCID) 20 MG tablet Take 1 tablet (20 mg total) by mouth at bedtime. 11/05/21   Milus Banister, MD  ?Ferrous Sulfate (IRON) 325 (65 Fe) MG TABS Take by mouth daily. 1/2 tablet for 4 weeks and then  increase to 1 tablet    [provider]  ?FLUoxetine (PROZAC) 40 MG capsule Take 1 tablet by mouth daily.    [provider]  ?levothyroxine (SYNTHROID) 25 MCG tablet Take 25 mcg by mouth daily. Take 1.5 pill daily    [provider]  ?melatonin 5 MG TABS 1.5  tablet at bedtime as needed with food    [provider]  ?Multiple Vitamin (MULTIVITAMIN) tablet Take 1 tablet by mouth daily.    [provider]  ?omeprazole (PRILOSEC) 20 MG capsule as needed. 09/07/20   [provider]  ?   ? ?Allergies    ?Crestor [rosuvastatin calcium] and Pravastatin   ? ?Review of Systems   ?Review of Systems  ?Neurological:  Positive for weakness.  ?All other systems reviewed and are negative. ? ?Physical Exam ?Updated Vital Signs ?BP (!) 155/109   Pulse 97   Temp 98.8 ?F (37.1 ?C) (Oral)   Resp 18   SpO2 99%  ?Physical Exam ?Vitals and nursing note reviewed.  ?Constitutional:   ?   General: She is not in acute distress. ?   Appearance: She is well-developed. She is not diaphoretic.  ?HENT:  ?   Head: Normocephalic and atraumatic.  ?Cardiovascular:  ?   Rate and Rhythm: Normal rate and regular rhythm.  ?   Heart sounds: No murmur heard. ?  No friction rub. No gallop.  ?Pulmonary:  ?   Effort: Pulmonary effort is normal. No respiratory distress.  ?  Breath sounds: Normal breath sounds. No wheezing.  ?Abdominal:  ?   General: Bowel sounds are normal. There is no distension.  ?   Palpations: Abdomen is soft.  ?   Tenderness: There is no abdominal tenderness.  ?Musculoskeletal:     ?   General: Normal range of motion.  ?   Cervical back: Normal range of motion and neck supple.  ?Skin: ?   General: Skin is warm and dry.  ?Neurological:  ?   General: No focal deficit present.  ?   Mental Status: She is alert and oriented to person, place, and time.  ?   Cranial Nerves: No cranial nerve deficit.  ?   Motor: No weakness.  ?   Coordination: Coordination normal.  ? ? ?ED Results /  Procedures / Treatments   ?Labs ?(all labs ordered are listed, but only abnormal results are displayed) ?Labs Reviewed  ?BASIC METABOLIC PANEL - Abnormal; Notable for the following components:  ?    Result Value  ? Sodium 125 (*)   ? Potassium 3.2 (*)   ? Chloride 93 (*)   ? CO2 21 (*)   ? Glucose, Bld 122 (*)   ? All other components within normal limits  ?CBC - Abnormal; Notable for the following components:  ? Hemoglobin 10.9 (*)   ? HCT 32.8 (*)   ? All other components within normal limits  ? ? ?EKG ?EKG Interpretation ? ?Date/Time:  Thursday January 27 2022 19:57:19 EDT ?Ventricular Rate:  98 ?PR Interval:  200 ?QRS Duration: 80 ?QT Interval:  402 ?QTC Calculation: 513 ?R Axis:   101 ?Text Interpretation: Normal sinus rhythm Lateral infarct , age undetermined Abnormal ECG When compared with ECG of 25-Jan-2022 06:54,no significant change is noted. Confirmed by Veryl Speak [phone]) on 01/27/2022 11:13:18 PM ? ?Radiology ?No results found. ? ?Procedures ?Procedures  ? ? ?Medications Ordered in ED ?Medications  ?sodium chloride 0.9 % bolus 1,000 mL (has no administration in time range)  ? ? ?ED Course/ Medical Decision Making/ A&P ? ?This patient presents to the ED for concern of weakness, this involves an extensive number of treatment options, and is a complaint that carries with it a high risk of complications and morbidity.  The differential diagnosis includes hyponatremia, CHF, anemia ? ? ?Co morbidities that complicate the patient evaluation ? ?None ? ? ?Additional history obtained: ? ?No additional history or external records needed ? ? ?Lab Tests: ? ?I Ordered, and personally interpreted labs.  The pertinent results include: Sodium of 125, but are otherwise unremarkable ? ? ?Imaging Studies ordered: ? ?I ordered imaging studies including chest x-ray ?I independently visualized and interpreted imaging which showed new mild cardiomegaly with central pulmonary vascular congestion and small pleural effusions, the  significance of which I am uncertain ?I agree with the radiologist interpretation ? ? ?Cardiac Monitoring: / EKG: ? ?The patient was maintained on a cardiac monitor.  I personally viewed and interpreted the cardiac monitored which showed an underlying rhythm of: Sinus rhythm ? ? ?Consultations Obtained: ? ?I requested consultation with the hospitalist,  and discussed lab and imaging findings as well as pertinent plan - they recommend: Admission for correction of sodium and further evaluation ? ? ?Problem List / ED Course / Critical interventions / Medication management ? ?Patient presenting with complaints of weakness and fatigue as described in the HPI.  I suspect her hyponatremia with sodium of 125 is a contributing factor.  There is also evidence for possibly new  onset CHF as her x-ray is abnormal and BNP is in the 1600 range.  Patient's situation discussed with Dr. Alcario Drought from the hospitalist service he is in agreement with me that the patient should be admitted for further work-up. ?No medications ordered ?I have reviewed the patients home medicines and have made adjustments as needed ? ? ?Social Determinants of Health: ? ?None ? ? ?Test / Admission - Considered: ? ?Patient to be admitted for sodium correction and what appears to be new onset CHF. ? ? ?Final Clinical Impression(s) / ED Diagnoses ?Final diagnoses:  ?None  ? ? ?Rx / DC Orders ?ED Discharge Orders   ? ? None  ? ?  ? ? ?  ?Veryl Speak, MD ?01/28/22 0400 ? ?

## 2022-01-27 NOTE — ED Triage Notes (Signed)
Pt has ongoing weakness for months. PCP told pt to come to the ER if she was still feeling weak. Pt states whats new is that she feels pressure around her "knee caps" when she stands up ?

## 2022-01-28 DIAGNOSIS — E876 Hypokalemia: Secondary | ICD-10-CM | POA: Diagnosis present

## 2022-01-28 DIAGNOSIS — E222 Syndrome of inappropriate secretion of antidiuretic hormone: Secondary | ICD-10-CM | POA: Diagnosis present

## 2022-01-28 DIAGNOSIS — E871 Hypo-osmolality and hyponatremia: Principal | ICD-10-CM | POA: Diagnosis present

## 2022-01-28 DIAGNOSIS — E039 Hypothyroidism, unspecified: Secondary | ICD-10-CM | POA: Diagnosis present

## 2022-01-28 DIAGNOSIS — Z79890 Hormone replacement therapy (postmenopausal): Secondary | ICD-10-CM | POA: Diagnosis not present

## 2022-01-28 DIAGNOSIS — E119 Type 2 diabetes mellitus without complications: Secondary | ICD-10-CM | POA: Diagnosis present

## 2022-01-28 DIAGNOSIS — Z888 Allergy status to other drugs, medicaments and biological substances status: Secondary | ICD-10-CM | POA: Diagnosis not present

## 2022-01-28 DIAGNOSIS — Z8249 Family history of ischemic heart disease and other diseases of the circulatory system: Secondary | ICD-10-CM | POA: Diagnosis not present

## 2022-01-28 DIAGNOSIS — I110 Hypertensive heart disease with heart failure: Secondary | ICD-10-CM | POA: Diagnosis present

## 2022-01-28 DIAGNOSIS — D649 Anemia, unspecified: Secondary | ICD-10-CM | POA: Diagnosis present

## 2022-01-28 DIAGNOSIS — I5033 Acute on chronic diastolic (congestive) heart failure: Secondary | ICD-10-CM | POA: Diagnosis present

## 2022-01-28 DIAGNOSIS — Z833 Family history of diabetes mellitus: Secondary | ICD-10-CM | POA: Diagnosis not present

## 2022-01-28 DIAGNOSIS — I5032 Chronic diastolic (congestive) heart failure: Secondary | ICD-10-CM | POA: Diagnosis not present

## 2022-01-28 DIAGNOSIS — Z800 Family history of malignant neoplasm of digestive organs: Secondary | ICD-10-CM | POA: Diagnosis not present

## 2022-01-28 DIAGNOSIS — E785 Hyperlipidemia, unspecified: Secondary | ICD-10-CM | POA: Diagnosis present

## 2022-01-28 DIAGNOSIS — F419 Anxiety disorder, unspecified: Secondary | ICD-10-CM | POA: Diagnosis present

## 2022-01-28 DIAGNOSIS — I341 Nonrheumatic mitral (valve) prolapse: Secondary | ICD-10-CM | POA: Diagnosis present

## 2022-01-28 DIAGNOSIS — E872 Acidosis, unspecified: Secondary | ICD-10-CM | POA: Diagnosis present

## 2022-01-28 DIAGNOSIS — K219 Gastro-esophageal reflux disease without esophagitis: Secondary | ICD-10-CM | POA: Diagnosis present

## 2022-01-28 DIAGNOSIS — Z79899 Other long term (current) drug therapy: Secondary | ICD-10-CM | POA: Diagnosis not present

## 2022-01-28 DIAGNOSIS — R5383 Other fatigue: Secondary | ICD-10-CM | POA: Diagnosis present

## 2022-01-28 LAB — BASIC METABOLIC PANEL
Anion gap: 9 (ref 5–15)
Anion gap: 9 (ref 5–15)
Anion gap: 7 (ref 5–15)
BUN: 12 mg/dL (ref 8–23)
BUN: 11 mg/dL (ref 8–23)
BUN: 15 mg/dL (ref 8–23)
CO2: 19 mmol/L — ABNORMAL LOW (ref 22–32)
CO2: 23 mmol/L (ref 22–32)
CO2: 23 mmol/L (ref 22–32)
Calcium: 8.3 mg/dL — ABNORMAL LOW (ref 8.9–10.3)
Calcium: 8.8 mg/dL — ABNORMAL LOW (ref 8.9–10.3)
Calcium: 8.7 mg/dL — ABNORMAL LOW (ref 8.9–10.3)
Chloride: 98 mmol/L (ref 98–111)
Chloride: 95 mmol/L — ABNORMAL LOW (ref 98–111)
Chloride: 98 mmol/L (ref 98–111)
Creatinine, Ser: 0.59 mg/dL (ref 0.44–1.00)
Creatinine, Ser: 0.69 mg/dL (ref 0.44–1.00)
Creatinine, Ser: 0.88 mg/dL (ref 0.44–1.00)
GFR, Estimated: >60 mL/min (ref >60)
GFR, Estimated: >60 mL/min (ref >60)
GFR, Estimated: >60 mL/min (ref >60)
Glucose, Bld: 112 mg/dL — ABNORMAL HIGH (ref 70–99)
Glucose, Bld: 129 mg/dL — ABNORMAL HIGH (ref 70–99)
Glucose, Bld: 109 mg/dL — ABNORMAL HIGH (ref 70–99)
Potassium: 4 mmol/L (ref 3.5–5.1)
Potassium: 3.9 mmol/L (ref 3.5–5.1)
Potassium: 4 mmol/L (ref 3.5–5.1)
Sodium: 126 mmol/L — ABNORMAL LOW (ref 135–145)
Sodium: 127 mmol/L — ABNORMAL LOW (ref 135–145)
Sodium: 128 mmol/L — ABNORMAL LOW (ref 135–145)

## 2022-01-28 LAB — URINALYSIS, ROUTINE W REFLEX MICROSCOPIC
Bilirubin Urine: NEGATIVE
Glucose, UA: NEGATIVE mg/dL
Ketones, ur: 15 mg/dL — AB
Leukocytes,Ua: NEGATIVE
Nitrite: NEGATIVE
Protein, ur: 30 mg/dL — AB
Specific Gravity, Urine: 1.016 (ref 1.005–1.030)
pH: 5 (ref 5.0–8.0)

## 2022-01-28 LAB — BRAIN NATRIURETIC PEPTIDE: B Natriuretic Peptide: 1619.7 pg/mL — ABNORMAL HIGH (ref 0.0–100.0)

## 2022-01-28 LAB — SODIUM, URINE, RANDOM: Sodium, Ur: 97 mmol/L

## 2022-01-28 LAB — HIV ANTIBODY (ROUTINE TESTING W REFLEX): HIV Screen 4th Generation wRfx: NONREACTIVE

## 2022-01-28 LAB — T4, FREE: Free T4: 1.51 ng/dL — ABNORMAL HIGH (ref 0.61–1.12)

## 2022-01-28 LAB — OSMOLALITY: Osmolality: 262 mOsm/kg — ABNORMAL LOW (ref 275–295)

## 2022-01-28 LAB — TROPONIN I (HIGH SENSITIVITY)
Troponin I (High Sensitivity): 15 ng/L (ref <18)
Troponin I (High Sensitivity): 16 ng/L (ref <18)

## 2022-01-28 LAB — OSMOLALITY, URINE: Osmolality, Ur: 565 mOsm/kg (ref 300–900)

## 2022-01-28 MED ORDER — ATORVASTATIN CALCIUM 20 MG PO TABS
20.0000 mg | ORAL_TABLET | ORAL | Status: DC
  Administered 2022-01-28: 20 mg via ORAL
  Filled 2022-01-28: qty 1

## 2022-01-28 MED ORDER — DILTIAZEM HCL ER COATED BEADS 180 MG PO CP24
180.0000 mg | ORAL_CAPSULE | Freq: Every day | ORAL | Status: DC
  Administered 2022-01-28 – 2022-01-29 (×2): 180 mg via ORAL
  Filled 2022-01-28 (×2): qty 1

## 2022-01-28 MED ORDER — VITAMIN B-12 100 MCG PO TABS
100.0000 ug | ORAL_TABLET | Freq: Every day | ORAL | Status: DC
  Administered 2022-01-29: 100 ug via ORAL
  Filled 2022-01-28: qty 1

## 2022-01-28 MED ORDER — FERROUS SULFATE 325 (65 FE) MG PO TABS
325.0000 mg | ORAL_TABLET | Freq: Every day | ORAL | Status: DC
  Filled 2022-01-28: qty 1

## 2022-01-28 MED ORDER — ACETAMINOPHEN 325 MG PO TABS: 650.0000 mg | ORAL_TABLET | Freq: Four times a day (QID) | ORAL | Status: DC | PRN

## 2022-01-28 MED ORDER — FAMOTIDINE 20 MG PO TABS
20.0000 mg | ORAL_TABLET | Freq: Every day | ORAL | Status: DC
  Filled 2022-01-28: qty 1

## 2022-01-28 MED ORDER — POLYETHYLENE GLYCOL 3350 17 G PO PACK
17.0000 g | PACK | Freq: Every day | ORAL | Status: DC | PRN
Start: 2022-01-28 — End: 2022-01-29

## 2022-01-28 MED ORDER — FLUOXETINE HCL 20 MG PO CAPS
40.0000 mg | ORAL_CAPSULE | Freq: Every day | ORAL | Status: DC
  Administered 2022-01-28 – 2022-01-29 (×2): 40 mg via ORAL
  Filled 2022-01-28 (×2): qty 2

## 2022-01-28 MED ORDER — HYDRALAZINE HCL 20 MG/ML IJ SOLN: 10.0000 mg | INTRAMUSCULAR | Status: DC | PRN

## 2022-01-28 MED ORDER — PROCHLORPERAZINE EDISYLATE 10 MG/2ML IJ SOLN: 10.0000 mg | Freq: Four times a day (QID) | INTRAMUSCULAR | Status: DC | PRN

## 2022-01-28 MED ORDER — METOPROLOL TARTRATE 5 MG/5ML IV SOLN: 5.0000 mg | INTRAVENOUS | Status: DC | PRN

## 2022-01-28 MED ORDER — LEVOTHYROXINE SODIUM 25 MCG PO TABS
25.0000 ug | ORAL_TABLET | Freq: Every day | ORAL | Status: DC
  Administered 2022-01-29: 25 ug via ORAL
  Filled 2022-01-28: qty 1

## 2022-01-28 MED ORDER — IPRATROPIUM-ALBUTEROL 0.5-2.5 (3) MG/3ML IN SOLN
3.0000 mL | RESPIRATORY_TRACT | Status: DC | PRN
Start: 2022-01-28 — End: 2022-01-29

## 2022-01-28 MED ORDER — ALPRAZOLAM 0.5 MG PO TABS
0.5000 mg | ORAL_TABLET | Freq: Every evening | ORAL | Status: DC | PRN
  Administered 2022-01-28: 0.5 mg via ORAL
  Filled 2022-01-28: qty 1

## 2022-01-28 MED ORDER — FUROSEMIDE 10 MG/ML IJ SOLN
40.0000 mg | Freq: Once | INTRAMUSCULAR | Status: AC
  Administered 2022-01-28: 40 mg via INTRAVENOUS
  Filled 2022-01-28: qty 4

## 2022-01-28 MED ORDER — POTASSIUM CHLORIDE CRYS ER 20 MEQ PO TBCR
40.0000 meq | EXTENDED_RELEASE_TABLET | Freq: Once | ORAL | Status: AC
  Administered 2022-01-28: 40 meq via ORAL
  Filled 2022-01-28: qty 2

## 2022-01-28 MED ORDER — ADULT MULTIVITAMIN W/MINERALS CH
1.0000 | ORAL_TABLET | Freq: Every day | ORAL | Status: DC
  Administered 2022-01-29: 1 via ORAL
  Filled 2022-01-28 (×2): qty 1

## 2022-01-28 MED ORDER — ENOXAPARIN SODIUM 40 MG/0.4ML IJ SOSY
40.0000 mg | PREFILLED_SYRINGE | INTRAMUSCULAR | Status: DC
  Administered 2022-01-28 – 2022-01-29 (×2): 40 mg via SUBCUTANEOUS
  Filled 2022-01-28 (×2): qty 0.4

## 2022-01-28 MED ORDER — TRAZODONE HCL 50 MG PO TABS: 50.0000 mg | ORAL_TABLET | Freq: Every evening | ORAL | Status: DC | PRN

## 2022-01-28 MED ORDER — SENNOSIDES-DOCUSATE SODIUM 8.6-50 MG PO TABS
1.0000 | ORAL_TABLET | Freq: Every evening | ORAL | Status: DC | PRN
Start: 2022-01-28 — End: 2022-01-29

## 2022-01-28 MED ORDER — GUAIFENESIN 100 MG/5ML PO LIQD: 5.0000 mL | ORAL | Status: DC | PRN

## 2022-01-28 MED ORDER — MELATONIN 3 MG PO TABS: 3.0000 mg | ORAL_TABLET | Freq: Every evening | ORAL | Status: DC | PRN

## 2022-01-28 NOTE — ED Notes (Signed)
Called [facility]  to transport patient to [facility] room 1432 ?

## 2022-01-28 NOTE — Evaluation (Signed)
Physical Therapy Evaluation ?Patient Details ?Name: [name] ?MRN: [id number] ?DOB: March 20, 1951 ?Today's Date: 01/28/2022 ? ?History of Present Illness ? 71 y.o. female presenting to ED 4/13 with generalized weakness x several weeks, dyspnea with minimal exertion and mild LE edema. Work-up (+) acute on chornic hypervolemic hyponatremia, hypokalemia, mild metabolic acidosis, acute on chronic CHF and elevated BNP. PMHx significant for HTN, HLD, GERD, thyroid disease, chronic hyponatremia, and Hx of palpitations.  ?Clinical Impression ? Patient is very unsteady with ambulation and balance, very guarded and required HHA. Recommended that patient use a RW next time that she ambulates with staff or family. ?HR up to 111, RR 38. Patient with short shallow breaths and stopped to stand and rest x 4 during 100' distance.   ? Patient requesting information for in home caregivers as patient home alone.  TOC states that  patient has to look for agency independently and patient has been given that information. ?Patient  reports that she has been functioning home alone with the current symptoms which she states have improved with hyponatremia. ? Pt admitted with above diagnosis.  Pt currently with functional limitations due to the deficits listed below (see PT Problem List). Pt will benefit from skilled PT to increase their independence and safety with mobility to allow discharge to the venue listed below.   ? ?   ? ?Recommendations for follow up therapy are one component of a multi-disciplinary discharge planning process, led by the attending physician.  Recommendations may be updated based on patient status, additional functional criteria and insurance authorization. ? ?Follow Up Recommendations Home health PT ? ?  ?Assistance Recommended at Discharge Frequent or constant Supervision/Assistance  ?Patient can return home with the following ? A little help with walking and/or transfers;A little help with  bathing/dressing/bathroom;Assistance with cooking/housework;Assist for transportation ? ?  ?Equipment Recommendations Rolling walker (2 wheels);Rollator (4 wheels) (will determine near DC)  ?Recommendations for Other Services ?    ?  ?Functional Status Assessment Patient has had a recent decline in their functional status and demonstrates the ability to make significant improvements in function in a reasonable and predictable amount of time.  ? ?  ?Precautions / Restrictions Precautions ?Precautions: Fall  ? ?  ? ?Mobility ? Bed Mobility ?  ?  ?  ?  ?  ?  ?  ?General bed mobility comments: in recliner ?  ? ?Transfers ?  ?  ?Transfers: Sit to/from Stand ?Sit to Stand: Min guard ?  ?  ?  ?  ?  ?General transfer comment: but quickly takes therapists hand ?  ? ?Ambulation/Gait ?Ambulation/Gait assistance: Min assist, Mod assist ?Gait Distance (Feet): 100 Feet ?Assistive device: 1 person hand held assist ?Gait Pattern/deviations: Step-through pattern, Drifts right/left ?Gait velocity: decr ?  ?  ?General Gait Details: gait is very slow and guarded. Patient stopped x 4  for a stand and rest break. Patient HR 100-11, RR up ro 38 with shallow  rapid respirations. patient stops for 8 seconds to get her breath. ? ?Stairs ?  ?  ?  ?  ?  ? ?Wheelchair Mobility ?  ? ?Modified Rankin (Stroke Patients Only) ?  ? ?  ? ?Balance Overall balance assessment: Needs assistance ?Sitting-balance support: Feet supported, No upper extremity supported ?Sitting balance-Leahy Scale: Good ?  ?  ?Standing balance support: During functional activity, No upper extremity supported ?Standing balance-Leahy Scale: Poor ?Standing balance comment: patient is very guarded, keeps head positioned forward, limited head turns ?  ?  ?  ?  ?  ?  ?  ?  ?  ?  ?  ?   ? ? ? ?  Pertinent Vitals/Pain Pain Assessment ?Pain Assessment: No/denies pain  ? ? ?Home Living Family/patient expects to be discharged to:: Private residence ?Living Arrangements: Alone ?Available  Help at Discharge: Family;Available PRN/intermittently ?Type of Home: House ?Home Access: Stairs to enter ?  ?Entrance Stairs-Number of Steps: 1 STE from garage w/o rail; 4 STE front door with unilateral rail ?  ?Home Layout: Two level;Able to live on main level with bedroom/bathroom ?Home Equipment: None ?Additional Comments: can borrow a RW  ?  ?Prior Function Prior Level of Function : Independent/Modified Independent;Driving ?  ?  ?  ?  ?  ?  ?Mobility Comments: was driving, limited outings due to weakness ?ADLs Comments: IADLs ?  ? ? ?Hand Dominance  ? Dominant Hand: Right ? ?  ?Extremity/Trunk Assessment  ? Upper Extremity Assessment ?Upper Extremity Assessment: Overall WFL for tasks assessed ?  ? ?Lower Extremity Assessment ?Lower Extremity Assessment: Generalized weakness ?  ? ?Cervical / Trunk Assessment ?Cervical / Trunk Assessment: Normal  ?Communication  ? Communication: No difficulties  ?Cognition Arousal/Alertness: Awake/alert ?Behavior During Therapy: Anxious ?Overall Cognitive Status: Within Functional Limits for tasks assessed ?  ?  ?  ?  ?  ?  ?  ?  ?  ?  ?  ?  ?  ?  ?  ?  ?  ?  ?  ? ?  ?General Comments   ? ?  ?Exercises    ? ?Assessment/Plan  ?  ?PT Assessment Patient needs continued PT services  ?PT Problem List Decreased strength;Decreased mobility;Decreased safety awareness;Decreased knowledge of precautions;Decreased activity tolerance;Decreased balance ? ?   ?  ?PT Treatment Interventions DME instruction;Therapeutic activities;Gait training;Therapeutic exercise;Patient/family education;Stair training;Balance training;Functional mobility training   ? ?PT Goals (Current goals can be found in the Care Plan section)  ?Acute Rehab PT Goals ?Patient Stated Goal: to go home, hire help ?PT Goal Formulation: With patient/family ?Time For Goal Achievement: 02/11/22 ?Potential to Achieve Goals: Good ? ?  ?Frequency Min 3X/week ?  ? ? ?Co-evaluation   ?  ?  ?  ?  ? ? ?  ?AM-PAC PT "6 Clicks" Mobility   ?Outcome Measure Help needed turning from your back to your side while in a flat bed without using bedrails?: A Little ?Help needed moving from lying on your back to sitting on the side of a flat bed without using bedrails?: A Little ?Help needed moving to and from a bed to a chair (including a wheelchair)?: A Little ?Help needed standing up from a chair using your arms (e.g., wheelchair or bedside chair)?: A Little ?Help needed to walk in hospital room?: A Little ?Help needed climbing 3-5 steps with a railing? : A Lot ?6 Click Score: 17 ? ?  ?End of Session Equipment Utilized During Treatment: Gait belt ?Activity Tolerance: Patient tolerated treatment well ?Patient left: in chair;with call bell/phone within reach;with family/visitor present ?Nurse Communication: Mobility status ?PT Visit Diagnosis: Unsteadiness on feet (R26.81);Muscle weakness (generalized) (M62.81) ?  ? ?Time: 1450-1510 ?PT Time Calculation (min) (ACUTE ONLY): 20 min ? ? ?Charges:   PT Evaluation ?$PT Eval Low Complexity: 1 Low ?  ?  ?   ? ? ?Tresa Endo PT ?Acute Rehabilitation Services ?Pager [phone] ?Office [phone] ? ? ?Korayma Hagwood, Shella Maxim ?01/28/2022, 3:28 PM ? ?

## 2022-01-28 NOTE — TOC Progression Note (Signed)
Transition of Care (TOC) - Progression Note  ? ? ?Patient Details  ?Name: [name] ?MRN: [id number] ?Date of Birth: 09/30/1951 ? ?Transition of Care (TOC) CM/SW Contact  ?Purcell Mouton, RN ?Phone Number: ?01/28/2022, 4:13 PM ? ?Clinical Narrative:    ?Spoke with pt concerning HHPT, pt declined at present time.  ? ? ?  ?  ? ?Expected Discharge Plan and Services ?  ?  ?  ?  ?  ?                ?  ?  ?  ?  ?  ?  ?  ?  ?  ?  ? ? ?Social Determinants of Health (SDOH) Interventions ?  ? ?Readmission Risk Interventions ?   ? View : No data to display.  ?  ?  ?  ? ? ?

## 2022-01-28 NOTE — TOC Progression Note (Signed)
Transition of Care (TOC) - Progression Note  ? ? ?Patient Details  ?Name: [name] ?MRN: [id number] ?Date of Birth: 07/04/1951 ? ?Transition of Care (TOC) CM/SW Contact  ?Purcell Mouton, RN ?Phone Number: ?01/28/2022, 4:14 PM ? ?Clinical Narrative:    ? ? ?Transition of Care (TOC) Screening Note ? ? ?Patient Details  ?Name: [name] ?Date of Birth: [birth year] ? ? ?Transition of Care (TOC) CM/SW Contact:    ?Purcell Mouton, RN ?Phone Number: ?01/28/2022, 4:14 PM ? ? ? ?Transition of Care Department [facility]) has reviewed patient and no TOC needs have been identified at this time. We will continue to monitor patient advancement through interdisciplinary progression rounds. If new patient transition needs arise, please place a TOC consult. ?  ? ?  ?  ? ?Expected Discharge Plan and Services ?  ?  ?  ?  ?  ?                ?  ?  ?  ?  ?  ?  ?  ?  ?  ?  ? ? ?Social Determinants of Health (SDOH) Interventions ?  ? ?Readmission Risk Interventions ?   ? View : No data to display.  ?  ?  ?  ? ? ?

## 2022-01-28 NOTE — H&P (Addendum)
?History and Physical ? ?[name] AST:[id number] DOB: 1951/02/01 DOA: 01/27/2022 ? ?Referring physician: Direct admit by Dr. Fabio Neighbors, [facility]  ?PCP: [name], FNP  ?Outpatient Specialists: Cardiology ?Patient coming from: Home ? ?Chief Complaint: Hyponatremia  ? ?HPI: [name] is a 71 y.o. female with medical history significant for hypertension, hyperlipidemia, GERD, hypothyroidism, history of palpitations follows with cardiology, chronic hyponatremia, who presented to [facility] ED with complaints of generalized weakness for several weeks.  She has had a work-up done for this however there were no known causes found.  Her generalized weakness is associated with dyspnea with minimal exertion, and mild lower extremity edema.  Work-up in the ED revealed acute on chronic hyponatremia, hypokalemia, mild metabolic acidosis, and elevated BNP greater than 1600.  Still symptomatic in the ED with conversational dyspnea.  EDP requesting admission by hospitalist service.  Patient was admitted by Dr. Alcario Drought, [facility], as a direct admit from [facility] ED to [facility] telemetry unit as observation status. ? ? ?ED Course: Tmax 98.8.  BP 157/108, pulse 94, respiration rate 21, saturation 97% on room air. ? ?Review of Systems: ?Review of systems as noted in the HPI. All other systems reviewed and are negative. ? ? ?Past Medical History:  ?Diagnosis Date  ? Anxiety   ? AV Nodal Reentry Tachycardia   ? s/p RFCA GT 2/15  ? AVNRT (AV nodal re-entry tachycardia) ([place]) 11/06/2013  ? Cataract   ? Diverticulosis 01/2020  ? H/O radiofrequency ablation for complex left atrial arrhythmia 12/12/2013  ? Headache   ? Hemorrhoids   ? Hyperlipemia   ? Hypothyroidism   ? Mitral valve prolapse   ? ?  ? Rectal leakage   ? 1 time  ? Retinal tear of both eyes   ? ?Past Surgical History:  ?Procedure Laterality Date  ? ABLATION  11/21/2013  ? RFCA of AVNRT by Dr Lovena Le  ? BREAST LUMPECTOMY Left 2000  ? left breast-  ? CATARCT    ? RIGHT EYE IN 09/2015  ?  COLONOSCOPY  2007, 2008  ? RETINAL DETACHMENT SURGERY    ? RIGHT EYE/SILICONE BUCKLE  ? RHINOPLASTY  1980  ? SEPTOPLASTY    ? 1980  ? skin lesions    ? left leg,right leg  ? SUPRAVENTRICULAR TACHYCARDIA ABLATION N/A 11/21/2013  ? Procedure: SUPRAVENTRICULAR TACHYCARDIA ABLATION;  Surgeon: Evans Lance, MD;  Location: [facility] CATH LAB;  Service: Cardiovascular;  Laterality: N/A;  ? ? ?Social History:  reports that she has never smoked. She has never used smokeless tobacco. She reports current alcohol use of about 1.0 standard drink per week. She reports that she does not use drugs. ? ? ?Allergies  ?Allergen Reactions  ? Crestor [Rosuvastatin Calcium] Other (See Comments)  ?  Muscle aches  ? Pravastatin   ?  Muscle aches ?  ? ? ?Family History  ?Problem Relation Age of Onset  ? Heart attack Mother 3  ? Pancreatic cancer Mother   ? Heart attack Father 42  ? Heart attack Sister 67  ? Heart disease Sister   ? Hernia Paternal Aunt   ? Colon cancer Paternal Uncle 44  ? Heart attack Maternal Grandmother   ? Heart attack Paternal Grandmother   ? Diabetes Nephew   ? Rectal cancer Neg Hx   ? Stomach cancer Neg Hx   ? Esophageal cancer Neg Hx   ?  ? ? ?Prior to Admission medications   ?Medication Sig Start Date End Date Taking? Authorizing Provider  ?  atorvastatin (LIPITOR) 20 MG tablet Take 1 tablet by mouth every other day.    [provider]  ?ciclopirox (LOPROX) 0.77 % cream Apply 1 application topically as needed. 05/12/21   [provider]  ?ciclopirox (PENLAC) 8 % solution Apply topically as needed. 06/10/21   [provider]  ?clotrimazole (LOTRIMIN) 1 % cream as needed. 08/13/18   [provider]  ?Cyanocobalamin (VITAMIN B12 PO) Take by mouth daily. [place]    [provider]  ?diltiazem (CARDIZEM CD) 180 MG 24 hr capsule Take 1 capsule (180 mg total) by mouth daily. 01/26/22 04/26/22  Adrian Prows, MD  ?famotidine (PEPCID) 20 MG tablet Take 1 tablet (20 mg total) by mouth at bedtime.  11/05/21   Milus Banister, MD  ?Ferrous Sulfate (IRON) 325 (65 Fe) MG TABS Take by mouth daily. 1/2 tablet for 4 weeks and then increase to 1 tablet    [provider]  ?FLUoxetine (PROZAC) 40 MG capsule Take 1 tablet by mouth daily.    [provider]  ?levothyroxine (SYNTHROID) 25 MCG tablet Take 25 mcg by mouth daily. Take 1.5 pill daily    [provider]  ?melatonin 5 MG TABS 1.5  tablet at bedtime as needed with food    [provider]  ?Multiple Vitamin (MULTIVITAMIN) tablet Take 1 tablet by mouth daily.    [provider]  ?omeprazole (PRILOSEC) 20 MG capsule as needed. 09/07/20   [provider]  ? ? ?Physical Exam: ?BP (!) 157/108 (BP Location: Right Arm)   Pulse 94   Temp 97.8 ?F (36.6 ?C) (Oral)   Resp (!) 21   Ht '5\' 5"'$  (1.651 m)   Wt 67.2 kg   SpO2 97%   BMI 24.65 kg/m?  ? ?General: 71 y.o. year-old female well developed well nourished in no acute distress.  Alert and oriented x3. ?Cardiovascular: Regular rate and rhythm with no rubs or gallops.  No thyromegaly or JVD noted.  Trace lower extremity edema. 2/4 pulses in all 4 extremities. ?Respiratory: Mild rales at bases.  No wheezing noted.  Poor inspiratory effort.   ?Abdomen: Soft nontender nondistended with normal bowel sounds x4 quadrants. ?Muskuloskeletal: No cyanosis or clubbing.  Trace edema noted bilaterally ?Neuro: CN II-XII intact, strength, sensation, reflexes ?Skin: No ulcerative lesions noted or rashes ?Psychiatry: Judgement and insight appear normal. Mood is appropriate for condition and setting ?   ?   ?   ?Labs on Admission:  ?Basic Metabolic Panel: ?Recent Labs  ?Lab 01/21/22 ?0807 01/25/22 ?0907 01/27/22 ?1952  ?NA 133* 126* 125*  ?K 3.6 3.2* 3.2*  ?CL 100 93* 93*  ?CO2 18* 21* 21*  ?GLUCOSE 100* 115* 122*  ?BUN '10 17 17  '$ ?CREATININE 0.72 0.69 0.69  ?CALCIUM 9.0 9.2 9.4  ?MG  --  1.7  --   ? ?Liver Function Tests: ?Recent Labs  ?Lab 01/21/22 ?0807 01/25/22 ?0907  ?AST 15 26   ?ALT 24 34  ?ALKPHOS 99 87  ?BILITOT 0.3 0.7  ?PROT 6.1 7.3  ?ALBUMIN 3.7* 4.1  ? ?No results for input(s): LIPASE, AMYLASE in the last 168 hours. ?No results for input(s): AMMONIA in the last 168 hours. ?CBC: ?Recent Labs  ?Lab 01/25/22 ?4481 01/27/22 ?1952  ?WBC 6.7 7.2  ?NEUTROABS 4.7  --   ?HGB 11.3* 10.9*  ?HCT 33.8* 32.8*  ?MCV 80.9 80.6  ?PLT 273 315  ? ?Cardiac Enzymes: ?No results for input(s): CKTOTAL, CKMB, CKMBINDEX, TROPONINI in the last 168 hours. ? ?  BNP (last 3 results) ?Recent Labs  ?  01/28/22 ?0012  ?BNP 1,619.7*  ? ? ?ProBNP (last 3 results) ?No results for input(s): PROBNP in the last 8760 hours. ? ?CBG: ?No results for input(s): GLUCAP in the last 168 hours. ? ?Radiological Exams on Admission: ?DG Chest Port 1 View ? ?Result Date: 01/27/2022 ?CLINICAL DATA:  Weakness. EXAM: PORTABLE CHEST 1 VIEW COMPARISON:  Chest x-ray 01/12/2021. FINDINGS: The heart is mildly enlarged. This is a new finding. There are small bilateral pleural effusions and central pulmonary vascular congestion. There are patchy opacities in the lung bases. There is no pneumothorax or acute fracture IMPRESSION: 1. New mild cardiomegaly with central pulmonary vascular congestion. 2. Small pleural effusions. 3. Bibasilar atelectasis/airspace disease. Electronically Signed   By: Ronney Asters M.D.   On: 01/27/2022 23:55   ? ?EKG: I independently viewed the EKG done and my findings are as followed: Sinus rhythm rate of 98.  Nonspecific ST-T changes.  QTc 513. ? ?Assessment/Plan ?Present on Admission: ? Hyponatremia ? ?Principal Problem: ?  Hyponatremia ? ?Acute on chronic hypervolemic hyponatremia ?Possible SIADH ?Obtain urine lites ?Fluid restriction, less than 1800 mL/day ?Avoid fast correction, no more than 8 mEq/L in 24 hrs ?BMP every 4 hours until sodium reaches above 130 ? ?Generalized weakness, suspect multifactorial ?Secondary to electrolyte imbalance, new acute CHF ?Continue to treat underlying conditions ?PT OT to  assess ?Encourage increasing oral protein calorie intake ? ?Acute on chronic diastolic CHF ?Last 2D echo done on 11/15/2013 revealed LVEF 55 to 60% and grade 1 diastolic dysfunction. ?Presented with BNP greater than 16

## 2022-01-28 NOTE — Progress Notes (Signed)
?PROGRESS NOTE ? ? ? ?[name]  YKD:[id number] DOB: 1951-09-16 DOA: 01/27/2022 ?PCP: [name], FNP  ? ?Brief Narrative:  ?71 year old with history of HTN, HLD, GERD, hypothyroidism, combined CHF EF 45% grade 1 DD comes to the hospital for generalized weakness for several weeks.  Patient has been following with cardiology outpatient.  Found to have slight hyponatremia without unknown cause.  BNP was elevated upon admission. ? ? ?Assessment & Plan: ? Principal Problem: ?  Hyponatremia ?Active Problems: ?  HYPERLIPIDEMIA TYPE I / IV ?  Hypothyroidism ?  ? ?Acute on chronic hypervolemic hyponatremia ?-Very mixed picture of SIADH but also volume overloaded.  Currently she is on fluid restriction.  Sodium improved from 125 > 126.  ?Lasix 40 mg IV once ?BMP every 8 hours ? ?  ?Generalized weakness, suspect multifactorial ?Suspect from underlying electrolyte imbalance. ?PT/OT ?  ?Acute on chronic diastolic CHF ?Last 2D echo done on 11/15/2013 revealed LVEF 55 to 60% and grade 1 diastolic dysfunction.  Echocardiogram from earlier this month shows EF of 45%, grade 1 DD.  Has elevated BNP.  Chest x-ray suggestive of cardiomegaly and pulmonary edema. ?Troponins are negative. ?Lasix 40 mg IV once and monitor sodium ?   ?Mild non-anion gap metabolic acidosis ?Continue to monitor.   ?  ?QTc prolongation ?Admission EKG shows QTc 513.  Repeat this morning ?  ?Chronic normocytic anemia ?Hb stable around 11 ?  ?Hypothyroidism ?Last TSH 6.406; check fT4 ?Continue Synthroid 25 mcg daily ?  ?History of palpitations ?Follows with outpatient cardiology.  On Cardizem 180 mg daily ?  ?Hyperlipidemia ?Continue Lipitor 20 mg daily ?  ?Physical debility ?PT/OT ?  ? ? ?DVT prophylaxis: Subcutaneous Lovenox ?Code Status: Full code ?Family Communication: Son is at the bedside ? ? ?Still has exertional dyspnea with hyponatremia.  She requires IV diuretics with close monitoring of sodium levels. ? ? ?Subjective: ?Patient admits of  feeling dyspneic with minimal exertion.  Has some lower extremity swelling. ? ? ? ?Examination: ? ?General exam: Appears calm and comfortable  ?Respiratory system: Bibasilar crackles ?Cardiovascular system: S1 & S2 heard, RRR. No JVD, murmurs, rubs, gallops or clicks.  1+ bilateral lower extremity pitting edema ?Gastrointestinal system: Abdomen is nondistended, soft and nontender. No organomegaly or masses felt. Normal bowel sounds heard. ?Central nervous system: Alert and oriented. No focal neurological deficits. ?Extremities: Symmetric 5 x 5 power. ?Skin: No rashes, lesions or ulcers ?Psychiatry: Judgement and insight appear normal. Mood & affect appropriate.  ? ? ? ?Objective: ?Vitals:  ? 01/28/22 0040 01/28/22 0200 01/28/22 0213 01/28/22 3976  ?BP:  (!) 157/108  (!) 144/96  ?Pulse:  94  93  ?Resp:  (!) 21  20  ?Temp: (!) 97.1 ?F (36.2 ?C) 97.8 ?F (36.6 ?C)  98.4 ?F (36.9 ?C)  ?TempSrc: Axillary Oral  Oral  ?SpO2:  97%  96%  ?Weight:   67.2 kg   ?Height:   '5\' 5"'$  (1.651 m)   ? ?No intake or output data in the 24 hours ending 01/28/22 0732 ?Filed Weights  ? 01/28/22 0213  ?Weight: 67.2 kg  ? ? ? ?Data Reviewed:  ? ?CBC: ?Recent Labs  ?Lab 01/25/22 ?7341 01/27/22 ?1952  ?WBC 6.7 7.2  ?NEUTROABS 4.7  --   ?HGB 11.3* 10.9*  ?HCT 33.8* 32.8*  ?MCV 80.9 80.6  ?PLT 273 315  ? ?Basic Metabolic Panel: ?Recent Labs  ?Lab 01/21/22 ?0807 01/25/22 ?0907 01/27/22 ?1952 01/28/22 ?9379  ?NA 133* 126* 125* 126*  ?K  3.6 3.2* 3.2* 4.0  ?CL 100 93* 93* 98  ?CO2 18* 21* 21* 19*  ?GLUCOSE 100* 115* 122* 112*  ?BUN '10 17 17 12  '$ ?CREATININE 0.72 0.69 0.69 0.59  ?CALCIUM 9.0 9.2 9.4 8.3*  ?MG  --  1.7  --   --   ? ?GFR: ?Estimated Creatinine Clearance: 59.7 mL/min (by C-G formula based on SCr of 0.59 mg/dL). ?Liver Function Tests: ?Recent Labs  ?Lab 01/21/22 ?0807 01/25/22 ?0907  ?AST 15 26  ?ALT 24 34  ?ALKPHOS 99 87  ?BILITOT 0.3 0.7  ?PROT 6.1 7.3  ?ALBUMIN 3.7* 4.1  ? ?No results for input(s): LIPASE, AMYLASE in the last 168  hours. ?No results for input(s): AMMONIA in the last 168 hours. ?Coagulation Profile: ?No results for input(s): INR, PROTIME in the last 168 hours. ?Cardiac Enzymes: ?No results for input(s): CKTOTAL, CKMB, CKMBINDEX, TROPONINI in the last 168 hours. ?BNP (last 3 results) ?No results for input(s): PROBNP in the last 8760 hours. ?HbA1C: ?No results for input(s): HGBA1C in the last 72 hours. ?CBG: ?No results for input(s): GLUCAP in the last 168 hours. ?Lipid Profile: ?No results for input(s): CHOL, HDL, LDLCALC, TRIG, CHOLHDL, LDLDIRECT in the last 72 hours. ?Thyroid Function Tests: ?Recent Labs  ?  01/25/22 ?0907  ?TSH 6.406*  ? ?Anemia Panel: ?No results for input(s): VITAMINB12, FOLATE, FERRITIN, TIBC, IRON, RETICCTPCT in the last 72 hours. ?Sepsis Labs: ?No results for input(s): PROCALCITON, LATICACIDVEN in the last 168 hours. ? ?No results found for this or any previous visit (from the past 240 hour(s)).  ? ? ? ? ? ?Radiology Studies: ?DG Chest Port 1 View ? ?Result Date: 01/27/2022 ?CLINICAL DATA:  Weakness. EXAM: PORTABLE CHEST 1 VIEW COMPARISON:  Chest x-ray 01/12/2021. FINDINGS: The heart is mildly enlarged. This is a new finding. There are small bilateral pleural effusions and central pulmonary vascular congestion. There are patchy opacities in the lung bases. There is no pneumothorax or acute fracture IMPRESSION: 1. New mild cardiomegaly with central pulmonary vascular congestion. 2. Small pleural effusions. 3. Bibasilar atelectasis/airspace disease. Electronically Signed   By: Ronney Asters M.D.   On: 01/27/2022 23:55   ? ? ? ? ? ?Scheduled Meds: ? atorvastatin  20 mg Oral QODAY  ? diltiazem  180 mg Oral Daily  ? enoxaparin (LOVENOX) injection  40 mg Subcutaneous Q24H  ? famotidine  20 mg Oral QHS  ? FLUoxetine  40 mg Oral Daily  ? Iron   Oral Daily  ? levothyroxine  25 mcg Oral Daily  ? multivitamin with minerals  1 tablet Oral Daily  ? vitamin B-12  100 mcg Oral Daily  ? ?Continuous Infusions: ? ? LOS: 0  days  ? ?Time spent= 35 mins ? ? ? ?Mariame Rybolt Arsenio Loader, MD ?[facility] ? ?If 7PM-7AM, please contact night-coverage ? ?01/28/2022, 7:32 AM  ? ?

## 2022-01-28 NOTE — Evaluation (Signed)
Occupational Therapy Evaluation ?Patient Details ?Name: [name] ?MRN: [id number] ?DOB: 08-Oct-1951 ?Today's Date: 01/28/2022 ? ? ?History of Present Illness 71 y.o. female presenting to ED 4/13 with generalized weakness x several weeks, dyspnea with minimal exertion and mild LE edema. Work-up (+) acute on chornic hypervolemic hyponatremia, hypokalemia, mild metabolic acidosis, acute on chronic CHF and elevated BNP. PMHx significant for HTN, HLD, GERD, thyroid disease, chronic hyponatremia, and Hx of palpitations.  ? ?Clinical Impression ?  ?PTA patient was living alone in a 2-level private residence with 1 STE from garage and was grossly I with ADLs/IADLs without AD. Patient currently functioning slightly below baseline demonstrating observed ADLs including 3/3 parts of toileting with Min guard/CGA. Patient also limited by deficits listed below including anxiety, generalized weakness and poor activity tolerance and would benefit from continued acute OT services in prep for safe d/c home with intermittent assist from family. Will need to confirm sons ability to provide necessary level of assist at time of d/c. OT will continue to follow acutely.  ? ?   ? ?Recommendations for follow up therapy are one component of a multi-disciplinary discharge planning process, led by the attending physician.  Recommendations may be updated based on patient status, additional functional criteria and insurance authorization.  ? ?Follow Up Recommendations ? No OT follow up  ?  ?Assistance Recommended at Discharge Intermittent Supervision/Assistance  ?Patient can return home with the following Assistance with cooking/housework;Assist for transportation ? ?  ?Functional Status Assessment ? Patient has had a recent decline in their functional status and demonstrates the ability to make significant improvements in function in a reasonable and predictable amount of time.  ?Equipment Recommendations ? None recommended by OT  ?   ?Recommendations for Other Services   ? ? ?  ?Precautions / Restrictions Precautions ?Precaution Comments: Low fall risk; high anxiety ?Restrictions ?Weight Bearing Restrictions: No  ? ?  ? ?Mobility Bed Mobility ?Overal bed mobility: Independent ?  ?  ?  ?  ?  ?  ?  ?  ? ?Transfers ?Overall transfer level: Needs assistance ?Equipment used: None ?Transfers: Sit to/from Stand ?Sit to Stand: Min guard ?  ?  ?  ?  ?  ?General transfer comment: Min gaurd for sit <> stand from EOB positioned in lowest setting and from standard height commode. ?  ? ?  ?Balance Overall balance assessment: Mild deficits observed, not formally tested ?  ?  ?  ?  ?  ?  ?  ?  ?  ?  ?  ?  ?  ?  ?  ?  ?  ?  ?   ? ?ADL either performed or assessed with clinical judgement  ? ?ADL Overall ADL's : Needs assistance/impaired ?  ?  ?Grooming: Min guard;Standing ?  ?Upper Body Bathing: Set up;Sitting ?  ?Lower Body Bathing: Min guard;Sit to/from stand ?  ?Upper Body Dressing : Set up;Sitting ?  ?Lower Body Dressing: Min guard;Sit to/from stand ?  ?Toilet Transfer: Min guard ?Toilet Transfer Details (indicate cue type and reason): To standard height commode with use of grab bar. ?Toileting- Water quality scientist and Hygiene: Min guard;Sit to/from stand ?Toileting - Clothing Manipulation Details (indicate cue type and reason): 3/3 parts of toileting task with Min gaurd for safety/steadying. ?  ?  ?  ?   ? ? ? ?Vision Baseline Vision/History: 1 Wears glasses ?Ability to See in Adequate Light: 1 Impaired ?Patient Visual Report: No change from baseline ?   ?   ?  Perception   ?  ?Praxis   ?  ? ?Pertinent Vitals/Pain Pain Assessment ?Pain Assessment: No/denies pain  ? ? ? ?Hand Dominance Right ?  ?Extremity/Trunk Assessment Upper Extremity Assessment ?Upper Extremity Assessment: Overall WFL for tasks assessed ?  ?Lower Extremity Assessment ?Lower Extremity Assessment: Defer to PT evaluation ?  ?Cervical / Trunk Assessment ?Cervical / Trunk Assessment: Normal ?   ?Communication Communication ?Communication: No difficulties ?  ?Cognition Arousal/Alertness: Awake/alert ?Behavior During Therapy: Anxious ?Overall Cognitive Status: Within Functional Limits for tasks assessed ?  ?  ?  ?  ?  ?  ?  ?  ?  ?  ?  ?  ?  ?  ?  ?  ?General Comments: Anxiety mildly limits participation ?  ?  ?General Comments  HR 102bpm at rest elevating to 123bpm with light activity. Mild DOE noted. Patient reports elevated anxiety this a.m. ? ?  ?Exercises   ?  ?Shoulder Instructions    ? ? ?Home Living Family/patient expects to be discharged to:: Private residence ?Living Arrangements: Alone ?Available Help at Discharge: Family;Available PRN/intermittently ?Type of Home: House ?Home Access: Stairs to enter ?Entrance Stairs-Number of Steps: 1 STE from garage w/o rail; 4 STE front door with unilateral rail ?Entrance Stairs-Rails: Left ?Home Layout: Two level;Able to live on main level with bedroom/bathroom ?Alternate Level Stairs-Number of Steps: Full flight; does not go upstairs ?  ?Bathroom Shower/Tub: Walk-in shower ?  ?Bathroom Toilet: Handicapped height ?  ?  ?Home Equipment: None ?  ?  ?  ? ?  ?Prior Functioning/Environment Prior Level of Function : Independent/Modified Independent;Driving ?  ?  ?  ?  ?  ?  ?  ?  ?  ? ?  ?  ?OT Problem List: Impaired balance (sitting and/or standing);Cardiopulmonary status limiting activity ?  ?   ?OT Treatment/Interventions: Self-care/ADL training;Therapeutic exercise;Energy conservation;Therapeutic activities;Patient/family education;Balance training  ?  ?OT Goals(Current goals can be found in the care plan section) Acute Rehab OT Goals ?Patient Stated Goal: To get better. ?OT Goal Formulation: With patient ?Time For Goal Achievement: 02/11/22 ?Potential to Achieve Goals: Good ?ADL Goals ?Additional ADL Goal #1: Patient will complete ADLs with Mod I. ?Additional ADL Goal #2: Patient will recall 3 energy conservation techniques in prep for ADLs/IADLs.  ?OT  Frequency: Min 2X/week ?  ? ?Co-evaluation   ?  ?  ?  ?  ? ?  ?AM-PAC OT "6 Clicks" Daily Activity     ?Outcome Measure Help from another person eating meals?: None ?Help from another person taking care of personal grooming?: A Little ?Help from another person toileting, which includes using toliet, bedpan, or urinal?: A Little ?Help from another person bathing (including washing, rinsing, drying)?: A Little ?Help from another person to put on and taking off regular upper body clothing?: None ?Help from another person to put on and taking off regular lower body clothing?: A Little ?6 Click Score: 20 ?  ?End of Session Equipment Utilized During Treatment: Gait belt ?Nurse Communication: Mobility status ? ?Activity Tolerance: Patient tolerated treatment well ?Patient left: in chair;with call bell/phone within reach;with chair alarm set ? ?OT Visit Diagnosis: Unsteadiness on feet (R26.81);Muscle weakness (generalized) (M62.81)  ?              ?Time: 3295-1884 ?OT Time Calculation (min): 20 min ?Charges:  OT General Charges ?$OT Visit: 1 Visit ?OT Evaluation ?$OT Eval Low Complexity: 1 Low ? ?Lyne Khurana H. OTR/L ?Supplemental OT, Department of rehab services [phone] ? ?  Prateek Knipple R H. ?01/28/2022, 8:15 AM ?

## 2022-01-29 DIAGNOSIS — E871 Hypo-osmolality and hyponatremia: Secondary | ICD-10-CM | POA: Diagnosis not present

## 2022-01-29 LAB — BASIC METABOLIC PANEL
Anion gap: 7 (ref 5–15)
Anion gap: 7 (ref 5–15)
BUN: 14 mg/dL (ref 8–23)
BUN: 14 mg/dL (ref 8–23)
CO2: 24 mmol/L (ref 22–32)
CO2: 24 mmol/L (ref 22–32)
Calcium: 8.7 mg/dL — ABNORMAL LOW (ref 8.9–10.3)
Calcium: 8.6 mg/dL — ABNORMAL LOW (ref 8.9–10.3)
Chloride: 99 mmol/L (ref 98–111)
Chloride: 97 mmol/L — ABNORMAL LOW (ref 98–111)
Creatinine, Ser: 0.8 mg/dL (ref 0.44–1.00)
Creatinine, Ser: 0.8 mg/dL (ref 0.44–1.00)
GFR, Estimated: >60 mL/min (ref >60)
GFR, Estimated: >60 mL/min (ref >60)
Glucose, Bld: 101 mg/dL — ABNORMAL HIGH (ref 70–99)
Glucose, Bld: 88 mg/dL (ref 70–99)
Potassium: 3.6 mmol/L (ref 3.5–5.1)
Potassium: 3.1 mmol/L — ABNORMAL LOW (ref 3.5–5.1)
Sodium: 130 mmol/L — ABNORMAL LOW (ref 135–145)
Sodium: 128 mmol/L — ABNORMAL LOW (ref 135–145)

## 2022-01-29 LAB — CBC
HCT: 30 % — ABNORMAL LOW (ref 36.0–46.0)
Hemoglobin: 10.2 g/dL — ABNORMAL LOW (ref 12.0–15.0)
MCH: 27.7 pg (ref 26.0–34.0)
MCHC: 34 g/dL (ref 30.0–36.0)
MCV: 81.5 fL (ref 80.0–100.0)
Platelets: 267 10*3/uL (ref 150–400)
RBC: 3.68 MIL/uL — ABNORMAL LOW (ref 3.87–5.11)
RDW: 13.9 % (ref 11.5–15.5)
WBC: 5.5 10*3/uL (ref 4.0–10.5)
nRBC: 0 % (ref 0.0–0.2)

## 2022-01-29 LAB — MAGNESIUM: Magnesium: 1.7 mg/dL (ref 1.7–2.4)

## 2022-01-29 MED ORDER — DILTIAZEM HCL ER COATED BEADS 180 MG PO CP24: 180.0000 mg | ORAL_CAPSULE | Freq: Every day | ORAL | 2 refills | Status: DC

## 2022-01-29 MED ORDER — FUROSEMIDE 40 MG PO TABS: 40.0000 mg | ORAL_TABLET | Freq: Every day | ORAL | 0 refills | Status: DC | PRN

## 2022-01-29 NOTE — Progress Notes (Signed)
AVS given to patient and explained at the bedside. Medications and follow up appointments have been explained with pt verbalizing understanding.  

## 2022-01-29 NOTE — Discharge Summary (Signed)
Physician Discharge Summary  ?[name] YSA:[id number] DOB: 1951-04-14 DOA: 01/27/2022 ? ?PCP: [name], FNP ? ?Admit date: 01/27/2022 ?Discharge date: 01/29/2022 ? ?Admitted From: Home ?Disposition: Home ? ?Recommendations for Outpatient Follow-up:  ?Follow up with PCP in 1-2 weeks ?Please obtain BMP/CBC in one week your next doctors visit.  ?Home oral Cardizem has been prescribed ?Lasix has been prescribed to be taken as needed.  Instructions given ?Follow-up outpatient cardiology, Dr. [name] ? ? ?Discharge Condition: Stable ?CODE STATUS: Full code ?Diet recommendation: Heart healthy ? ?Brief/Interim Summary: ?71 year old with history of HTN, HLD, GERD, hypothyroidism, combined CHF EF 45% grade 1 DD comes to the hospital for generalized weakness for several weeks.  Patient has been following with cardiology outpatient.  Found to have slight hyponatremia without unknown cause.  BNP was elevated upon admission.  After receiving the shot of diabetic her symptoms significantly improved including shortness of breath and lower extremity swelling.  Her sodium levels also improved 230 on the day of discharge.  Today she is medically stable to be discharged with recommendations as stated above. ?  ?  ?Assessment & Plan: ? Principal Problem: ?  Hyponatremia ?Active Problems: ?  HYPERLIPIDEMIA TYPE I / IV ?  Hypothyroidism ?  ?  ?Acute on chronic hypervolemic hyponatremia ?-This has improved with IV Lasix, sodium today is 130.  Continue to follow outpatient. ?  ?  ?Generalized weakness, suspect multifactorial ?Generalized weakness has improved.  PT/OT recommended home health, arrangements made. ?  ?Acute on chronic diastolic CHF ?Last 2D echo done on 11/15/2013 revealed LVEF 55 to 60% and grade 1 diastolic dysfunction.  Echocardiogram from earlier this month shows EF of 45%, grade 1 DD.  Has elevated BNP.  Chest x-ray suggestive of cardiomegaly and pulmonary edema. ?Troponins are negative. ?Lasix has helped  significantly, at this time we will discharge her on as needed Lasix to be taken at home.  Instructions given. ?   ?Mild non-anion gap metabolic acidosis ?Continue to monitor.   ?  ?QTc prolongation ?Admission EKG shows QTc 513. ?  ?Chronic normocytic anemia ?Hb stable around 11 ?  ?Hypothyroidism ?Last TSH 6.406; continue Synthroid.  Follow-up outpatient with primary care physician. ?  ?History of palpitations ?Follows with outpatient cardiology.  On Cardizem 180 mg daily, prescription has been given. ?  ?Hyperlipidemia ?Continue Lipitor 20 mg daily ?  ?Physical debility ?PT/OT ?  ? ? ?Assessment and Plan: ?No notes have been filed under this hospital service. ?Service: Hospitalist ? ? ? ?  ?Body mass index is 23.93 kg/m?. ? ?  ? ? ? ?Discharge Diagnoses:  ?Principal Problem: ?  Hyponatremia ?Active Problems: ?  HYPERLIPIDEMIA TYPE I / IV ?  Hypothyroidism ? ? ? ?Subjective: ?Patient feels much better almost back to her baseline.  No complaints this morning.  All the questions have been answered by me, RN staff at bedside. ? ?Discharge Exam: ?Vitals:  ? 01/29/22 0430 01/29/22 0934  ?BP: 118/67 119/73  ?Pulse: 79 86  ?Resp: 19 18  ?Temp: 98.3 ?F (36.8 ?C) 98.7 ?F (37.1 ?C)  ?SpO2: 94% 96%  ? ?Vitals:  ? 01/28/22 2013 01/29/22 0430 01/29/22 0500 01/29/22 0934  ?BP: 104/64 118/67  119/73  ?Pulse: 89 79  86  ?Resp: '18 19  18  '$ ?Temp: 98.2 ?F (36.8 ?C) 98.3 ?F (36.8 ?C)  98.7 ?F (37.1 ?C)  ?TempSrc: Oral Oral  Oral  ?SpO2: 98% 94%  96%  ?Weight:   65.2 kg   ?Height:      ? ? ?  General: Pt is alert, awake, not in acute distress ?Cardiovascular: RRR, S1/S2 +, no rubs, no gallops ?Respiratory: CTA bilaterally, no wheezing, no rhonchi ?Abdominal: Soft, NT, ND, bowel sounds + ?Extremities: no edema, no cyanosis ? ?Discharge Instructions ? ? ?Allergies as of 01/29/2022   ? ?   Reactions  ? Crestor [rosuvastatin Calcium] Other (See Comments)  ? Muscle aches  ? Pravastatin Other (See Comments)  ? Muscle aches  ? Propranolol Other  (See Comments)  ? Caused severe weakness  ? ?  ? ?  ?Medication List  ?  ? ?STOP taking these medications   ? ?famotidine 20 MG tablet ?Commonly known as: Pepcid ?  ?omeprazole 20 MG tablet ?Commonly known as: PRILOSEC OTC ?  ? ?  ? ?TAKE these medications   ? ?ALPRAZolam 0.25 MG tablet ?Commonly known as: Duanne Moron ?Take 0.25 mg by mouth 2 (two) times daily as needed for anxiety or sleep. ?  ?atorvastatin 20 MG tablet ?Commonly known as: LIPITOR ?Take 20 mg by mouth every other day. ?  ?ciclopirox 0.77 % cream ?Commonly known as: LOPROX ?Apply 1 application. topically 2 (two) times daily as needed (foot fungus). ?  ?ciclopirox 8 % solution ?Commonly known as: PENLAC ?Apply topically as needed. ?  ?diltiazem 180 MG 24 hr capsule ?Commonly known as: CARDIZEM CD ?Take 1 capsule (180 mg total) by mouth daily. ?  ?estradiol 0.1 MG/GM vaginal cream ?Commonly known as: ESTRACE ?Place 1 Applicatorful vaginally 2 (two) times a week. ?  ?FLUoxetine 40 MG capsule ?Commonly known as: PROZAC ?Take 40 mg by mouth every morning. ?  ?fluticasone 50 MCG/ACT nasal spray ?Commonly known as: FLONASE ?Place 1 spray into both nostrils at bedtime as needed for allergies or rhinitis. ?  ?furosemide 40 MG tablet ?Commonly known as: Lasix ?Take 1 tablet (40 mg total) by mouth daily as needed for fluid or edema. ?  ?Iron 325 (65 Fe) MG Tabs ?Take by mouth daily. 1/2 tablet for 4 weeks and then increase to 1 tablet ?  ?melatonin 3 MG Tabs tablet ?Take 3-6 mg by mouth at bedtime as needed (sleep). ?  ?multivitamin tablet ?Take 1 tablet by mouth 4 (four) times a week. ?  ?OVER THE COUNTER MEDICATION ?Take 1 tablet by mouth 2 (two) times a week. Vegan iron tablet ?  ?Synthroid 25 MCG tablet ?Generic drug: levothyroxine ?Take 37.5 mcg by mouth daily before breakfast. ?  ?Systane 0.4-0.3 % Soln ?Generic drug: Polyethyl Glycol-Propyl Glycol ?Place 1 drop into both eyes daily as needed (dry eyes). ?  ?VITAMIN B12 PO ?Take by mouth daily. GUMMIES ?  ? ?   ? ? Follow-up Information   ? ? [name], FNP Follow up in 1 week(s).   ?Specialty: Internal Medicine ?Contact information: ?[place] [address] ?[phone] ? ? ?  ?  ? ? Azucena Fallen, MD .   ?Specialty: Obstetrics and Gynecology ?Contact information: ?[address] ?[place] [address] ?[phone] ? ? ?  ?  ? ?  ?  ? ?  ? ?Allergies  ?Allergen Reactions  ? Crestor [Rosuvastatin Calcium] Other (See Comments)  ?  Muscle aches  ? Pravastatin Other (See Comments)  ?  Muscle aches ?  ? Propranolol Other (See Comments)  ?  Caused severe weakness  ? ? ?You were cared for by a hospitalist during your hospital stay. If you have any questions about your discharge medications or the care you received while you were in the  hospital after you are discharged, you can call the unit and asked to speak with the hospitalist on call if the hospitalist that took care of you is not available. Once you are discharged, your primary care physician will handle any further medical issues. Please note that no refills for any discharge medications will be authorized once you are discharged, as it is imperative that you return to your primary care physician (or establish a relationship with a primary care physician if you do not have one) for your aftercare needs so that they can reassess your need for medications and monitor your lab values. ? ? ?Procedures/Studies: ?DG Chest Port 1 View ? ?Result Date: 01/27/2022 ?CLINICAL DATA:  Weakness. EXAM: PORTABLE CHEST 1 VIEW COMPARISON:  Chest x-ray 01/12/2021. FINDINGS: The heart is mildly enlarged. This is a new finding. There are small bilateral pleural effusions and central pulmonary vascular congestion. There are patchy opacities in the lung bases. There is no pneumothorax or acute fracture IMPRESSION: 1. New mild cardiomegaly with central pulmonary vascular congestion. 2. Small pleural effusions. 3. Bibasilar atelectasis/airspace disease.  Electronically Signed   By: Ronney Asters M.D.   On: 01/27/2022 23:55  ? ?LONG TERM MONITOR (3-14 DAYS) ? ?Result Date: 01/07/2022 ?Zio Patch Extended out patient EKG monitoring 14 days starting 12/14/2021:  Tia Masker

## 2022-02-09 DIAGNOSIS — E039 Hypothyroidism, unspecified: Secondary | ICD-10-CM | POA: Diagnosis not present

## 2022-02-09 DIAGNOSIS — R3121 Asymptomatic microscopic hematuria: Secondary | ICD-10-CM | POA: Diagnosis not present

## 2022-02-14 ENCOUNTER — Ambulatory Visit: Payer: Medicare Other | Admitting: Internal Medicine

## 2022-02-16 DIAGNOSIS — I499 Cardiac arrhythmia, unspecified: Secondary | ICD-10-CM | POA: Diagnosis not present

## 2022-02-16 DIAGNOSIS — Z09 Encounter for follow-up examination after completed treatment for conditions other than malignant neoplasm: Secondary | ICD-10-CM | POA: Diagnosis not present

## 2022-02-16 DIAGNOSIS — E871 Hypo-osmolality and hyponatremia: Secondary | ICD-10-CM | POA: Diagnosis not present

## 2022-02-16 DIAGNOSIS — R002 Palpitations: Secondary | ICD-10-CM | POA: Diagnosis not present

## 2022-02-16 DIAGNOSIS — R031 Nonspecific low blood-pressure reading: Secondary | ICD-10-CM | POA: Diagnosis not present

## 2022-02-16 NOTE — Progress Notes (Signed)
?Cardiology Office Note:   ? ?Date:  02/17/2022  ? ?ID:  [name], DOB 04-Dec-1950, MRN [id number] ? ?PCP:  [name], FNP  ? ?[facility] ?Cardiologist:  [name], MD ?Referring MD: [name], [place]  ? ?Chief Complaint/Reason for Referral: Heart failure ? ?ASSESSMENT:   ? ?1. Chronic systolic heart failure ([place])   ?2. Hyperlipidemia, unspecified hyperlipidemia type   ?3. H/O radiofrequency ablation for complex left atrial arrhythmia   ?4. Palpitations   ? ? ?PLAN:   ? ?In order of problems listed above: ?1.  Chronic systolic heart failure: Patient has a mild cardiomyopathy with global hypokinesis.  She will require coronary angiography study to rule out obstructive coronary artery disease.  Coronary artery calcium score of 0 few years ago would rule out calcified plaque I will refer her for coronary CTA to evaluate further.  While she does not examine overtly volume overloaded I am concerned that she is harboring some extra volume.  We did discuss an invasive assessment including coronary angiography and a right heart catheterization to better define her hemodynamics.  At this point in time the patient would like to opt for a coronary CTA.  We consider Jardiance in the future  and stop diltiazem.  We will start Toprol-XL 25 mg at bedtime.  Start Lasix 20 mg twice daily.  Check BMP in 1 week's time.  Follow-up in 3 months or earlier if needed. ?2.  Hyperlipidemia: This is being followed by patient's primary care provider.  Her coronary artery calcium score was 0. ?3.  AVNRT ablation: We will start Toprol rather than diltiazem given LV dysfunction. ?4.  Palpitations we will obtain monitor to evaluate further. ? ? ?     ? ?   ? ?Dispo:  Return in about 3 months (around 05/20/2022).  ? ?  ? ?Medication Adjustments/Labs and Tests Ordered: ?Current medicines are reviewed at length with the patient today.  Concerns regarding medicines are outlined above. ? ?The following changes have  been made:    ? ?Labs/tests ordered: ?No orders of the defined types were placed in this encounter. ? ? ?Medication Changes: ?No orders of the defined types were placed in this encounter. ? ? ? ?Current medicines are reviewed at length with the patient today.  The patient does not have concerns regarding medicines. ? ? ?History of Present Illness:   ? ?FOCUSED PROBLEM LIST:   ?1.  AVNRT status post catheter ablation 2015 ?2.  Hyperlipidemia ?3.  Family history of coronary artery disease ?4.  Coronary artery calcium score 2020 of 0 ?5.  Mild cardiomyopathy ejection fraction 45 to 50% ? ?The patient is a 71 y.o. female with the indicated medical history here to establish cardiovascular care.  The patient had been cared for Dr. [name] for some time.  She had a coronary artery calcium score of 0 in 2020 and an echocardiogram last month demonstrated an ejection fraction of 45 to 50% with no significant valvular abnormalities with global hypokinesis.  She was admitted recently with acute on chronic systolic and diastolic heart failure.  She received a dose of diuretic and was discharged home.  She is here to after hospital admission.  Her troponins were negative however her BNP was elevated.  Her chest x-ray demonstrated pulmonary edema. ?   ?She tells me that she has been short of breath and fatigued for some time.  She also feels palpitations at night.  Her weight has been going down however  and she is on a fluid restriction.  She has noted some peripheral edema.  She does not note any orthopnea or paroxysmal nocturnal dyspnea.  She occasionally gets lightheaded at times.  She has had no frank syncope.  She denies any exertional angina. ?  ? ? ?Current Medications: ?Current Meds  ?Medication Sig  ? ALPRAZolam (XANAX) 0.25 MG tablet Take 0.25 mg by mouth 2 (two) times daily as needed for anxiety or sleep.  ? atorvastatin (LIPITOR) 20 MG tablet Take 20 mg by mouth every other day.  ? ciclopirox (LOPROX) 0.77 % cream Apply  1 application. topically 2 (two) times daily as needed (foot fungus).  ? ciclopirox (PENLAC) 8 % solution Apply topically as needed.  ? Cyanocobalamin (VITAMIN B12 PO) Take by mouth daily. GUMMIES  ? estradiol (ESTRACE) 0.1 MG/GM vaginal cream Place 1 Applicatorful vaginally 2 (two) times a week.  ? Ferrous Sulfate (IRON) 325 (65 Fe) MG TABS Take by mouth daily. 1/2 tablet for 4 weeks and then increase to 1 tablet  ? FLUoxetine (PROZAC) 40 MG capsule Take 40 mg by mouth every morning.  ? fluticasone (FLONASE) 50 MCG/ACT nasal spray Place 1 spray into both nostrils at bedtime as needed for allergies or rhinitis.  ? levothyroxine (SYNTHROID) 25 MCG tablet Take 37.5 mcg by mouth daily before breakfast.  ? melatonin 3 MG TABS tablet Take 3-6 mg by mouth at bedtime as needed (sleep).  ? Multiple Vitamin (MULTIVITAMIN) tablet Take 1 tablet by mouth 4 (four) times a week.  ? OVER THE COUNTER MEDICATION Take 1 tablet by mouth 2 (two) times a week. Vegan iron tablet  ? Polyethyl Glycol-Propyl Glycol (SYSTANE) 0.4-0.3 % SOLN Place 1 drop into both eyes daily as needed (dry eyes).  ? [DISCONTINUED] diltiazem (CARDIZEM CD) 180 MG 24 hr capsule Take 1 capsule (180 mg total) by mouth daily.  ? [DISCONTINUED] furosemide (LASIX) 40 MG tablet Take 1 tablet (40 mg total) by mouth daily as needed for fluid or edema.  ?  ? ?Allergies:    ?Crestor [rosuvastatin calcium], Pravastatin, and Propranolol  ? ?Social History:   ?Social History  ? ?Tobacco Use  ? Smoking status: Never  ? Smokeless tobacco: Never  ?Vaping Use  ? Vaping Use: Never used  ?Substance Use Topics  ? Alcohol use: Yes  ?  Alcohol/week: 1.0 standard drink  ?  Types: 1 Glasses of wine per week  ?  Comment: socially  ? Drug use: No  ?  ? ?Family Hx: ?Family History  ?Problem Relation Age of Onset  ? Heart attack Mother 68  ? Pancreatic cancer Mother   ? Heart attack Father 60  ? Heart attack Sister 63  ? Heart disease Sister   ? Hernia Paternal Aunt   ? Colon cancer  Paternal Uncle 72  ? Heart attack Maternal Grandmother   ? Heart attack Paternal Grandmother   ? Diabetes Nephew   ? Other Son   ?     Avascular acrosis  ? Clotting disorder Son   ? Rectal cancer Neg Hx   ? Stomach cancer Neg Hx   ? Esophageal cancer Neg Hx   ?  ? ?Review of Systems:   ?Please see the history of present illness.    ?All other systems reviewed and are negative. ?  ? ? ?EKGs/Labs/Other Test Reviewed:   ? ?EKG:  EKG performed April 2023 that I personally reviewed demonstrates sinus rhythm with lateral infarction pattern. ? ?Prior CV studies: ? ?TTE  2023 with ejection fraction of 45 to 50% with mild global hypokinesis and no significant valvular abnormalities ? ?Monitor 2023 demonstrates sinus rhythm with occasional first-degree AV block and 20 ventricular tachycardic episodes lasting 10 beats and 36 atrial tachycardic episodes lasting 12.5 seconds ? ?ETT 2017 low risk with DTS of 9 ? ?Other studies Reviewed: ?Review of the additional studies/records demonstrates: CT abdomen pelvis 2021 without aortic atherosclerosis ? ?Recent Labs: ?01/25/2022: ALT 34; TSH 6.406 ?01/28/2022: B Natriuretic Peptide 1,619.7 ?01/29/2022: BUN 14; Creatinine, Ser 0.80; Hemoglobin 10.2; Magnesium 1.7; Platelets 267; Potassium 3.1; Sodium 128  ? ?Recent Lipid Panel ?Lab Results  ?Component Value Date/Time  ? CHOL 290 (H) 01/18/2012 09:10 AM  ? TRIG 127.0 01/18/2012 09:10 AM  ? HDL 54.40 01/18/2012 09:10 AM  ? LDLDIRECT 209.4 01/18/2012 09:10 AM  ? ? ?Risk Assessment/Calculations:   ? ?  ?    ? ?Physical Exam:   ? ?VS:  BP 122/76   Pulse 100   Ht '5\' 5"'$  (1.651 m)   Wt 128 lb 9.6 oz (58.3 kg)   SpO2 99%   BMI 21.40 kg/m?    ?Wt Readings from Last 3 Encounters:  ?02/17/22 128 lb 9.6 oz (58.3 kg)  ?01/29/22 143 lb 12.8 oz (65.2 kg)  ?01/25/22 138 lb (62.6 kg)  ?  ?GENERAL:  No apparent distress, AOx3 ?HEENT:  No carotid bruits, +2 carotid impulses, no scleral icterus ?CAR: RRR no murmurs, gallops, rubs, or thrills ?RES:  Clear to  auscultation bilaterally ?ABD:  Soft, nontender, nondistended, positive bowel sounds x 4 ?VASC:  +2 radial pulses, +2 carotid pulses, palpable pedal pulses ?NEURO:  CN 2-12 grossly intact; motor and sensory gro

## 2022-02-17 ENCOUNTER — Encounter: Payer: Self-pay | Admitting: Internal Medicine

## 2022-02-17 ENCOUNTER — Ambulatory Visit (INDEPENDENT_AMBULATORY_CARE_PROVIDER_SITE_OTHER): Payer: Medicare Other | Admitting: Internal Medicine

## 2022-02-17 ENCOUNTER — Ambulatory Visit (INDEPENDENT_AMBULATORY_CARE_PROVIDER_SITE_OTHER): Payer: Medicare Other

## 2022-02-17 ENCOUNTER — Encounter: Payer: Self-pay | Admitting: *Deleted

## 2022-02-17 VITALS — BP 122/76 | HR 100 | Ht 65.0 in | Wt 128.6 lb

## 2022-02-17 DIAGNOSIS — Z8679 Personal history of other diseases of the circulatory system: Secondary | ICD-10-CM | POA: Diagnosis not present

## 2022-02-17 DIAGNOSIS — E785 Hyperlipidemia, unspecified: Secondary | ICD-10-CM

## 2022-02-17 DIAGNOSIS — R002 Palpitations: Secondary | ICD-10-CM

## 2022-02-17 DIAGNOSIS — Z9889 Other specified postprocedural states: Secondary | ICD-10-CM | POA: Diagnosis not present

## 2022-02-17 DIAGNOSIS — I5022 Chronic systolic (congestive) heart failure: Secondary | ICD-10-CM | POA: Diagnosis not present

## 2022-02-17 MED ORDER — FUROSEMIDE 20 MG PO TABS: 20.0000 mg | ORAL_TABLET | Freq: Two times a day (BID) | ORAL | 3 refills | Status: DC

## 2022-02-17 MED ORDER — METOPROLOL SUCCINATE ER 25 MG PO TB24: 25.0000 mg | ORAL_TABLET | Freq: Every day | ORAL | 3 refills | Status: DC

## 2022-02-17 NOTE — Progress Notes (Unsigned)
ZIO XT serial # [id number] from office inventory applied to patient. ?

## 2022-02-17 NOTE — Patient Instructions (Addendum)
Medication Instructions:  ?Your physician has recommended you make the following change in your medication:  ?1.) stop diltiazem ?2.) start metoprolol succinate (Toprol XL) 25 mg - one tablet daily at bedtime ?3.) change furosemide (Lasix) to 20 mg twice a day ? ? ?*If you need a refill on your cardiac medications before your next appointment, please call your pharmacy* ? ? ?Lab Work: ?Please return in 1 week for lab work Artist) ? ?If you have labs (blood work) drawn today and your tests are completely normal, you will receive your results only by: ?MyChart Message (if you have MyChart) OR ?A paper copy in the mail ?If you have any lab test that is abnormal or we need to change your treatment, we will call you to review the results. ? ? ?Testing/Procedures: ?ZIO monitor (3 days) ? ? ?Follow-Up: ?At [facility], you and your health needs are our priority.  As part of our continuing mission to provide you with exceptional heart care, we have created designated Provider Care Teams.  These Care Teams include your primary Cardiologist (physician) and Advanced Practice Providers (APPs -  Physician Assistants and Nurse Practitioners) who all work together to provide you with the care you need, when you need it. ? ? ?Your next appointment:   ?3 month(s) ? ?The format for your next appointment:   ?In Person ? ?Provider:   ?Early Osmond, MD   ? ? ? ? ?Your cardiac CT will be scheduled at one of the below locations:  ? ?[facility] ?[address] ?[place], [place] [address] ?[phone] ? ?OR ? ?[facility] ?[address] ?[address] ?[place], [place] [address] ?[phone] ? ?If scheduled at [facility], please arrive at the [facility] Entrance (Entrance C2) of [facility] 30 minutes prior to test start time. ?You can use the FREE valet parking offered at entrance C (encouraged to control the heart rate for the test)  ?Proceed to the [facility] Radiology Department (first floor) to check-in and test prep. ? ?All radiology patients and guests should use entrance C2 at [facility], accessed from [facility], even though the hospital's physical address listed is [address] ? ? ? ?Please follow these instructions carefully (unless otherwise directed): ? ?On the Night Before the Test: ?Be sure to Drink plenty of water. ?Do not consume any caffeinated/decaffeinated beverages or chocolate 12 hours prior to your test. ?Do not take any antihistamines 12 hours prior to your test. ? ?On the Day of the Test: ?Drink plenty of water until 1 hour prior to the test. ?Do not eat any food 4 hours prior to the test. ?You may take your regular medications prior to the test.  ?Hold Furosemide the morning of      the test. ?FEMALES- please wear underwire-free bra if available, avoid dresses & tight clothing ? ?After the Test: ?Drink plenty of water. ?After receiving IV contrast, you may experience a mild flushed feeling. This is normal. ?On occasion, you may experience a mild rash up to 24 hours after the test. This is not dangerous. If this occurs, you can take Benadryl 25 mg and increase your fluid intake. ?If you experience trouble breathing, this can be serious. If it is severe call 911 IMMEDIATELY. If it is mild, please call our office. ?If you take any of these medications: Glipizide/Metformin, Avandament, Glucavance, please do not take 48 hours after completing test unless otherwise  instructed. ? ?We will call to schedule your test 2-4 weeks out understanding that some insurance companies will need an authorization prior to the service being performed.  ? ?For non-scheduling related questions, please contact the cardiac imaging nurse navigator should you have any questions/concerns: ?Marchia Bond, Cardiac Imaging Nurse Navigator ?Gordy Clement, Cardiac Imaging Nurse Navigator ? [facility] ?Direct Office  Dial: [phone]  ? ?For scheduling needs, including cancellations and rescheduling, please call [place], [phone]. ? ?

## 2022-02-24 ENCOUNTER — Other Ambulatory Visit: Payer: Self-pay | Admitting: Internal Medicine

## 2022-02-24 DIAGNOSIS — E785 Hyperlipidemia, unspecified: Secondary | ICD-10-CM | POA: Diagnosis not present

## 2022-02-24 DIAGNOSIS — I5022 Chronic systolic (congestive) heart failure: Secondary | ICD-10-CM | POA: Diagnosis not present

## 2022-02-24 DIAGNOSIS — R002 Palpitations: Secondary | ICD-10-CM | POA: Diagnosis not present

## 2022-02-24 DIAGNOSIS — Z9889 Other specified postprocedural states: Secondary | ICD-10-CM | POA: Diagnosis not present

## 2022-02-24 DIAGNOSIS — Z8679 Personal history of other diseases of the circulatory system: Secondary | ICD-10-CM | POA: Diagnosis not present

## 2022-02-25 ENCOUNTER — Ambulatory Visit: Payer: Medicare Other | Admitting: Interventional Cardiology

## 2022-02-25 LAB — BASIC METABOLIC PANEL
BUN/Creatinine Ratio: 17 (ref 12–28)
BUN: 15 mg/dL (ref 8–27)
CO2: 25 mmol/L (ref 20–29)
Calcium: 9.6 mg/dL (ref 8.7–10.3)
Chloride: 98 mmol/L (ref 96–106)
Creatinine, Ser: 0.9 mg/dL (ref 0.57–1.00)
Glucose: 108 mg/dL — ABNORMAL HIGH (ref 70–99)
Potassium: 4 mmol/L (ref 3.5–5.2)
Sodium: 139 mmol/L (ref 134–144)
eGFR: 69 mL/min/{1.73_m2} (ref >59)

## 2022-03-02 DIAGNOSIS — E785 Hyperlipidemia, unspecified: Secondary | ICD-10-CM | POA: Diagnosis not present

## 2022-03-02 DIAGNOSIS — Z9889 Other specified postprocedural states: Secondary | ICD-10-CM | POA: Diagnosis not present

## 2022-03-02 DIAGNOSIS — I5022 Chronic systolic (congestive) heart failure: Secondary | ICD-10-CM | POA: Diagnosis not present

## 2022-03-02 DIAGNOSIS — Z8679 Personal history of other diseases of the circulatory system: Secondary | ICD-10-CM | POA: Diagnosis not present

## 2022-03-03 ENCOUNTER — Other Ambulatory Visit: Payer: Self-pay | Admitting: *Deleted

## 2022-03-03 MED ORDER — METOPROLOL SUCCINATE ER 25 MG PO TB24: 37.5000 mg | ORAL_TABLET | Freq: Every day | ORAL | 3 refills | Status: DC

## 2022-03-04 ENCOUNTER — Telehealth (HOSPITAL_COMMUNITY): Payer: Self-pay | Admitting: Emergency Medicine

## 2022-03-04 NOTE — Telephone Encounter (Signed)
Attempted to call patient regarding upcoming cardiac CT appointment. °Left message on voicemail with name and callback number °Lizet Kelso RN Navigator Cardiac Imaging °[facility] °[phone] Office °[phone] Cell ° °

## 2022-03-08 ENCOUNTER — Telehealth: Payer: Self-pay | Admitting: Internal Medicine

## 2022-03-08 ENCOUNTER — Ambulatory Visit (HOSPITAL_COMMUNITY)
Admission: RE | Admit: 2022-03-08 | Discharge: 2022-03-08 | Disposition: A | Discharge status: Home / Self Care | Payer: Medicare Other | Source: Ambulatory Visit | Attending: Internal Medicine | Admitting: Internal Medicine

## 2022-03-08 DIAGNOSIS — R0609 Other forms of dyspnea: Secondary | ICD-10-CM

## 2022-03-08 DIAGNOSIS — I5022 Chronic systolic (congestive) heart failure: Secondary | ICD-10-CM | POA: Insufficient documentation

## 2022-03-08 MED ORDER — METOPROLOL TARTRATE 5 MG/5ML IV SOLN
5.0000 mg | INTRAVENOUS | Status: DC | PRN
  Administered 2022-03-08 (×2): 5 mg via INTRAVENOUS

## 2022-03-08 MED ORDER — DILTIAZEM HCL 25 MG/5ML IV SOLN
INTRAVENOUS | Status: AC
  Filled 2022-03-08: qty 5

## 2022-03-08 MED ORDER — NITROGLYCERIN 0.4 MG SL SUBL
0.8000 mg | SUBLINGUAL_TABLET | Freq: Once | SUBLINGUAL | Status: AC
  Administered 2022-03-08: 0.8 mg via SUBLINGUAL

## 2022-03-08 MED ORDER — IOHEXOL 350 MG/ML SOLN
95.0000 mL | Freq: Once | INTRAVENOUS | Status: AC | PRN
  Administered 2022-03-08: 95 mL via INTRAVENOUS

## 2022-03-08 MED ORDER — NITROGLYCERIN 0.4 MG SL SUBL
SUBLINGUAL_TABLET | SUBLINGUAL | Status: AC
  Filled 2022-03-08: qty 2

## 2022-03-08 MED ORDER — METOPROLOL TARTRATE 5 MG/5ML IV SOLN
INTRAVENOUS | Status: AC
Start: 2022-03-08 — End: 2022-03-08
  Administered 2022-03-08: 5 mg via INTRAVENOUS
  Filled 2022-03-08: qty 10

## 2022-03-08 MED ORDER — DILTIAZEM HCL 25 MG/5ML IV SOLN: 5.0000 mg | INTRAVENOUS | Status: DC | PRN

## 2022-03-08 MED ORDER — METOPROLOL TARTRATE 5 MG/5ML IV SOLN
INTRAVENOUS | Status: AC
  Administered 2022-03-08: 5 mg via INTRAVENOUS
  Filled 2022-03-08: qty 10

## 2022-03-08 NOTE — Telephone Encounter (Signed)
Pt c/o Shortness Of Breath: STAT if SOB developed within the last 24 hours or pt is noticeably SOB on the phone  1. Are you currently SOB (can you hear that pt is SOB on the phone)? no  2. How long have you been experiencing SOB?  Getting worse since last week- have to get help to her car if she goes out  3. Are you SOB when sitting or when up moving around? Only when she moves around  4. Are you currently experiencing any other symptoms?  When she drink water or eat feels a weird sensation in her chest- this started on yesterday(03-07-22)

## 2022-03-08 NOTE — Telephone Encounter (Signed)
Called pt in regards to SOB.  Reports SOB started in April when she had an ED visit and was dx with HF.  Reports 6-8 times daily has what she describes as hyperventilating with activity.  These episodes last no longer than 2 minutes pt does controlled breathing to decrease symptoms. Feels as if she may faint at any moment.  Can't walk more than 20 steps without having to stop and catch breath.  Pt reports drinks no more than 1,800 ml daily, monitors salt intake and weighs daily.  Unsure of last BP reading thinks it may have been 138-140(SBP).   Also reports difficulty swallowing, gets a weird sensation in heart when drinks water.  Sleeps propped up on pillows and has irregular breathing.  Pt does not take medications for GERD.  Advised pt to reach out to PCP for these symptoms.  Pt reports was on omeprazole but can only be on it for a month.  I advised pt that medication can be taken daily and if not omeprazole another medication.  Pt feels that symptoms have worsened since Toprol-XL was increased.  Advised pt MD not in the office today but will send to be reviewed.  Pt scheduled to see Dr. Ali Lowe on 03/10/22 at 8:40 am.  Advised pt to call 911 if symptoms worsen.  Pt expresses she understands instructions and will have family member bring her to [place] d/t inability to walk short distances. Had a Cardiac CT today awaiting results.

## 2022-03-09 ENCOUNTER — Other Ambulatory Visit: Payer: Self-pay | Admitting: *Deleted

## 2022-03-09 DIAGNOSIS — R0609 Other forms of dyspnea: Secondary | ICD-10-CM | POA: Diagnosis not present

## 2022-03-09 NOTE — Telephone Encounter (Signed)
Pt aware and will have lab done today ./cy

## 2022-03-09 NOTE — Progress Notes (Unsigned)
Cardiology Office Note:    Date:  03/10/2022   ID:  [name], DOB [birth year], MRN [id number]  PCP:  [name], [place] Prov[facility] Cardiologist:  [name], MD Referring MD: [name], FNP   Chief Complaint/Reason for Referral: Heart failure  ASSESSMENT:    1. Dyspnea on exertion   2. Chronic systolic heart failure ([place])   3. Hyperlipidemia, unspecified hyperlipidemia type   4. H/O radiofrequency ablation for complex left atrial arrhythmia      PLAN:    In order of problems listed above: 1.  Dyspnea: The patient's proBNP is significantly elevated.  The patient is responding to Lasix 40 mg twice daily.  We will continue this and start Zaroxolyn 2.5 mg daily for 3 days only.  I will start her on potassium chloride 20 mill equivalents daily.  I will check a BMP in 1 week.  I will check a BMP next week.  If this does not help then we will need to consider right heart catheterization.  She will follow-up with [name] as previously scheduled in August. 2.  Chronic systolic heart failure: See discussion above.  We did talk about starting Jardiance and the patient is reluctant to do this despite the mortality benefit in her patient population.  We will revisit this again in August. 3.  Hyperlipidemia: This is being followed by patient's primary care provider.  Her coronary artery calcium score was 0.  She lacks aortic atherosclerosis or coronary artery disease so this may be able to be managed by diet alone. 4.  AVNRT ablation: Continue Toprol for now.  Her recent monitor showed limited SVT.   Dispo:  Return in about 3 months (around 06/10/2022).      Medication Adjustments/Labs and Tests Ordered: Current medicines are reviewed at length with the patient today.  Concerns regarding medicines are outlined above.  The following changes have been made:     Labs/tests ordered: Orders Placed This Encounter  Procedures   Basic metabolic panel   EKG  62-IWLN    Medication Changes: Meds ordered this encounter  Medications   metolazone (ZAROXOLYN) 2.5 MG tablet    Sig: Take 1 tablet (2.5 mg total) by mouth daily for 3 days. Take 30 min before morning dose of Lasix    Dispense:  3 tablet    Refill:  0   DISCONTD: potassium chloride SA (KLOR-CON M) 20 MEQ tablet    Sig: Take 1 tablet (20 mEq total) by mouth daily.    Dispense:  90 tablet    Refill:  3   furosemide (LASIX) 40 MG tablet    Sig: Take 1 tablet (40 mg total) by mouth 2 (two) times daily.    Dispense:  180 tablet    Refill:  3    Dose increase   potassium chloride (KLOR-CON) 10 MEQ tablet    Sig: Take 2 tablets (20 mEq total) by mouth daily.    Dispense:  180 tablet    Refill:  3     Current medicines are reviewed at length with the patient today.  The patient does not have concerns regarding medicines.   History of Present Illness:    FOCUSED PROBLEM LIST:   1.  AVNRT status post catheter ablation 2015 2.  Hyperlipidemia 3.  Family history of coronary artery disease 4.  Coronary artery calcium score 2020 of 0 5.  Mild cardiomyopathy ejection fraction 45 to 50%  May 2023 Initial consultation:  The patient is a 71 y.o. female with the indicated medical history here to establish cardiovascular care.  The patient had been cared for Dr. [name] for some time.  She had a coronary artery calcium score of 0 in 2020 and an echocardiogram last month demonstrated an ejection fraction of 45 to 50% with no significant valvular abnormalities with global hypokinesis.  She was admitted recently with acute on chronic systolic and diastolic heart failure.  She received a dose of diuretic and was discharged home.  She is here to after hospital admission.  Her troponins were negative however her BNP was elevated.  Her chest x-ray demonstrated pulmonary edema.    She tells me that she has been short of breath and fatigued for some time.  She also feels palpitations at night.  Her weight  has been going down however and she is on a fluid restriction.  She has noted some peripheral edema.  She does not note any orthopnea or paroxysmal nocturnal dyspnea.  She occasionally gets lightheaded at times.  She has had no frank syncope.  She denies any exertional angina.  Patient diltiazem was discontinued due to the mild cardiomyopathy she had developed.  Additionally Toprol XL 25 mg at bedtime and Lasix 20 mg twice daily was initiated.  Patient was referred for coronary CTA.    Today: In the interim the patient had a coronary CTA which was negative with a score of 0.  It did show bilateral pleural effusions.  The patient's event monitor demonstrated 11 runs of supraventricular ventricular tachycardia as well as 5 runs of nonsustained ventricular tachycardia.  Her Toprol-XL was increased to 37.5 mg.  A proBNP was ordered and found to be over 7500.  The increased dose of the Lasix to 40 mg twice daily has helped over the last day.  The patient feels less short of breath and overall better.  She does still complain of orthopnea however she has had no significant peripheral edema.  Her fatigue level seems to be better as well as her shortness of breath.  Current Medications: Current Meds  Medication Sig   ALPRAZolam (XANAX) 0.25 MG tablet Take 0.25 mg by mouth 2 (two) times daily as needed for anxiety or sleep.   atorvastatin (LIPITOR) 20 MG tablet Take 20 mg by mouth every other day.   ciclopirox (LOPROX) 0.77 % cream Apply 1 application. topically 2 (two) times daily as needed (foot fungus).   ciclopirox (PENLAC) 8 % solution Apply topically as needed.   Cyanocobalamin (VITAMIN B12 PO) Take by mouth daily. GUMMIES   estradiol (ESTRACE) 0.1 MG/GM vaginal cream Place 1 Applicatorful vaginally 2 (two) times a week.   Ferrous Sulfate (IRON) 325 (65 Fe) MG TABS Take by mouth daily. 1/2 tablet for 4 weeks and then increase to 1 tablet   FLUoxetine (PROZAC) 40 MG capsule Take 40 mg by mouth every  morning.   fluticasone (FLONASE) 50 MCG/ACT nasal spray Place 1 spray into both nostrils at bedtime as needed for allergies or rhinitis.   furosemide (LASIX) 40 MG tablet Take 1 tablet (40 mg total) by mouth 2 (two) times daily.   levothyroxine (SYNTHROID) 25 MCG tablet Take 37.5 mcg by mouth daily before breakfast.   melatonin 3 MG TABS tablet Take 3-6 mg by mouth at bedtime as needed (sleep).   metolazone (ZAROXOLYN) 2.5 MG tablet Take 1 tablet (2.5 mg total) by mouth daily for 3 days. Take 30 min before morning dose of Lasix   metoprolol  succinate (TOPROL XL) 25 MG 24 hr tablet Take 1.5 tablets (37.5 mg total) by mouth at bedtime.   Multiple Vitamin (MULTIVITAMIN) tablet Take 1 tablet by mouth 4 (four) times a week.   OVER THE COUNTER MEDICATION Take 1 tablet by mouth 2 (two) times a week. Vegan iron tablet   Polyethyl Glycol-Propyl Glycol (SYSTANE) 0.4-0.3 % SOLN Place 1 drop into both eyes daily as needed (dry eyes).   potassium chloride (KLOR-CON) 10 MEQ tablet Take 2 tablets (20 mEq total) by mouth daily.   [DISCONTINUED] furosemide (LASIX) 20 MG tablet Take 1 tablet (20 mg total) by mouth 2 (two) times daily.   [DISCONTINUED] potassium chloride SA (KLOR-CON M) 20 MEQ tablet Take 1 tablet (20 mEq total) by mouth daily.     Allergies:    Crestor [rosuvastatin calcium], Pravastatin, and Propranolol   Social History:   Social History   Tobacco Use   Smoking status: Never   Smokeless tobacco: Never  Vaping Use   Vaping Use: Never used  Substance Use Topics   Alcohol use: Yes    Alcohol/week: 1.0 standard drink    Types: 1 Glasses of wine per week    Comment: socially   Drug use: No     Family Hx: Family History  Problem Relation Age of Onset   Heart attack Mother 64   Pancreatic cancer Mother    Heart attack Father 40   Heart attack Sister 87   Heart disease Sister    Hernia Paternal Aunt    Colon cancer Paternal Uncle 60   Heart attack Maternal Grandmother    Heart  attack Paternal Grandmother    Diabetes Nephew    Other Son        Avascular acrosis   Clotting disorder Son    Rectal cancer Neg Hx    Stomach cancer Neg Hx    Esophageal cancer Neg Hx      Review of Systems:   Please see the history of present illness.    All other systems reviewed and are negative.     EKGs/Labs/Other Test Reviewed:    EKG:  EKG performed April 2023 that I personally reviewed demonstrates sinus rhythm with lateral infarction pattern.  EKG today demonstrates sinus rhythm with first-degree AV blockd  Prior CV studies:  Monitor 2023 15 episodes of nonsustained ventricular tachycardia and 11 episodes of supraventricular tachycardia with the longest lasting 14 seconds  Coronary CTA 2023 calcium score of 0 with bilateral pleural effusions  TTE 2023 with ejection fraction of 45 to 50% with mild global hypokinesis and no significant valvular abnormalities  Monitor 2023 demonstrates sinus rhythm with occasional first-degree AV block and 20 ventricular tachycardic episodes lasting 10 beats and 36 atrial tachycardic episodes lasting 12.5 seconds  ETT 2017 low risk with DTS of 9  Other studies Reviewed: Review of the additional studies/records demonstrates: CT abdomen pelvis 2021 without aortic atherosclerosis  Recent Labs: 01/25/2022: ALT 34; TSH 6.406 01/28/2022: B Natriuretic Peptide 1,619.7 01/29/2022: Hemoglobin 10.2; Magnesium 1.7; Platelets 267 02/24/2022: BUN 15; Creatinine, Ser 0.90; Potassium 4.0; Sodium 139 03/09/2022: NT-Pro BNP 7,651   Recent Lipid Panel Lab Results  Component Value Date/Time   CHOL 290 (H) 01/18/2012 09:10 AM   TRIG 127.0 01/18/2012 09:10 AM   HDL 54.40 01/18/2012 09:10 AM   LDLDIRECT 209.4 01/18/2012 09:10 AM    Risk Assessment/Calculations:           Physical Exam:    VS:  BP 112/62  Pulse 78   Ht '5\' 4"'$  (1.626 m)   Wt 130 lb (59 kg)   SpO2 97%   BMI 22.31 kg/m    Wt Readings from Last 3 Encounters:  03/10/22 130 lb  (59 kg)  02/17/22 128 lb 9.6 oz (58.3 kg)  01/29/22 143 lb 12.8 oz (65.2 kg)    GENERAL:  No apparent distress, AOx3 HEENT:  No carotid bruits, +2 carotid impulses, no scleral icterus; +JVD CAR: RRR no murmurs, gallops, rubs, or thrills RES:  Clear to auscultation bilaterally ABD:  Soft, nontender, nondistended, positive bowel sounds x 4 VASC:  +2 radial pulses, +2 carotid pulses, palpable pedal pulses NEURO:  CN 2-12 grossly intact; motor and sensory grossly intact PSYCH:  No active depression or anxiety EXT:  No edema, ecchymosis, or cyanosis  Signed, Early Osmond, MD  03/10/2022 9:32 AM    [address], [place], [place]  [address] Phone: [phone]; Fax: [phone]   Note:  This document was prepared using Dragon voice recognition software and may include unintentional dictation errors.

## 2022-03-09 NOTE — Addendum Note (Signed)
Addended by: Devra Dopp E on: 03/09/2022 08:08 AM   Modules accepted: Orders

## 2022-03-10 ENCOUNTER — Encounter: Payer: Self-pay | Admitting: Internal Medicine

## 2022-03-10 ENCOUNTER — Ambulatory Visit (INDEPENDENT_AMBULATORY_CARE_PROVIDER_SITE_OTHER): Payer: Medicare Other | Admitting: Internal Medicine

## 2022-03-10 VITALS — BP 112/62 | HR 78 | Ht 64.0 in | Wt 130.0 lb

## 2022-03-10 DIAGNOSIS — Z8679 Personal history of other diseases of the circulatory system: Secondary | ICD-10-CM

## 2022-03-10 DIAGNOSIS — Z9889 Other specified postprocedural states: Secondary | ICD-10-CM | POA: Diagnosis not present

## 2022-03-10 DIAGNOSIS — I5022 Chronic systolic (congestive) heart failure: Secondary | ICD-10-CM | POA: Diagnosis not present

## 2022-03-10 DIAGNOSIS — E785 Hyperlipidemia, unspecified: Secondary | ICD-10-CM

## 2022-03-10 DIAGNOSIS — R0609 Other forms of dyspnea: Secondary | ICD-10-CM | POA: Diagnosis not present

## 2022-03-10 LAB — PRO B NATRIURETIC PEPTIDE: NT-Pro BNP: 7651 pg/mL — ABNORMAL HIGH (ref 0–301)

## 2022-03-10 MED ORDER — FUROSEMIDE 40 MG PO TABS: 40.0000 mg | ORAL_TABLET | Freq: Two times a day (BID) | ORAL | 3 refills | Status: DC

## 2022-03-10 MED ORDER — POTASSIUM CHLORIDE CRYS ER 20 MEQ PO TBCR: 20.0000 meq | EXTENDED_RELEASE_TABLET | Freq: Every day | ORAL | 3 refills | Status: DC

## 2022-03-10 MED ORDER — METOLAZONE 2.5 MG PO TABS: 2.5000 mg | ORAL_TABLET | Freq: Every day | ORAL | 0 refills | Status: DC

## 2022-03-10 MED ORDER — POTASSIUM CHLORIDE ER 10 MEQ PO TBCR: 20.0000 meq | EXTENDED_RELEASE_TABLET | Freq: Every day | ORAL | 3 refills | Status: DC

## 2022-03-10 NOTE — Patient Instructions (Addendum)
Medication Instructions:  Your physician has recommended you make the following change in your medication:  1.) increase lasix to 40 mg twice a day 2.) start metolazone (Zaroxalyn) 2.5 mg - take ONE tablet 30 MINUTES BEFORE MORNING DOSE of lasix FOR 3 DAYS 3.) start potassium chloride 20 meq - take one tablet daily  *If you need a refill on your cardiac medications before your next appointment, please call your pharmacy*   Lab Work: Please return in [place] for lab work (BMET)   Testing/Procedures: none   Follow-Up: As planned  Important Information About Sugar      Addendum: 03/10/22 9:43 am - copy of office visit note sent to Dr. Claudia Desanctis [facility] per patient request. Philomena Course

## 2022-03-17 DIAGNOSIS — I5022 Chronic systolic (congestive) heart failure: Secondary | ICD-10-CM | POA: Diagnosis not present

## 2022-03-17 DIAGNOSIS — R0609 Other forms of dyspnea: Secondary | ICD-10-CM | POA: Diagnosis not present

## 2022-03-18 ENCOUNTER — Telehealth: Payer: Self-pay | Admitting: Internal Medicine

## 2022-03-18 ENCOUNTER — Encounter: Payer: Self-pay | Admitting: Internal Medicine

## 2022-03-18 DIAGNOSIS — I5022 Chronic systolic (congestive) heart failure: Secondary | ICD-10-CM

## 2022-03-18 LAB — BASIC METABOLIC PANEL
BUN/Creatinine Ratio: 35 — ABNORMAL HIGH (ref 12–28)
BUN: 43 mg/dL — ABNORMAL HIGH (ref 8–27)
CO2: 29 mmol/L (ref 20–29)
Calcium: 10 mg/dL (ref 8.7–10.3)
Chloride: 92 mmol/L — ABNORMAL LOW (ref 96–106)
Creatinine, Ser: 1.22 mg/dL — ABNORMAL HIGH (ref 0.57–1.00)
Glucose: 89 mg/dL (ref 70–99)
Potassium: 3.2 mmol/L — ABNORMAL LOW (ref 3.5–5.2)
Sodium: 140 mmol/L (ref 134–144)
eGFR: 48 mL/min/{1.73_m2} — ABNORMAL LOW (ref >59)

## 2022-03-18 NOTE — Telephone Encounter (Signed)
Pt would like a callback to discuss test results. Please advise

## 2022-03-21 ENCOUNTER — Other Ambulatory Visit: Payer: Self-pay | Admitting: *Deleted

## 2022-03-21 DIAGNOSIS — Z0189 Encounter for other specified special examinations: Secondary | ICD-10-CM

## 2022-03-21 DIAGNOSIS — E871 Hypo-osmolality and hyponatremia: Secondary | ICD-10-CM

## 2022-03-22 ENCOUNTER — Telehealth: Payer: Self-pay | Admitting: Internal Medicine

## 2022-03-22 MED ORDER — FUROSEMIDE 40 MG PO TABS
40.0000 mg | ORAL_TABLET | Freq: Every day | ORAL | 3 refills | Status: DC
Start: 2022-03-22 — End: 2022-05-05

## 2022-03-22 NOTE — Telephone Encounter (Signed)
Done.  See previous encounter for details.

## 2022-03-22 NOTE — Telephone Encounter (Signed)
Richmond Campbell, LPN  10/22/1094  0:45 PM EDT Back to Top    Pt aware and orders done for pt to come Friday for pro bnp and bmet .Nelwyn Salisbury, MD  03/18/2022  6:56 PM EDT     I spoke with her and told her to decrease lasix to 40 qday and let's get pro-BNP and BMP next Thursday-Friday  ____________________________________________________________________________________________    I updated medication list -Lasix is 40 mg daily and placed lab orders again - per pt she will plan to have them at [facility].  Orders released.  Pt informed.  Previously scheduled lab appointment at our office has been cancelled.

## 2022-03-22 NOTE — Telephone Encounter (Signed)
Patient wants to go for her lab work at [facility] on [address], she doesn't want to come into the office to have them done. She would like the lab orders to them. She sent a MyChart about this as well.  She said you can reply to her via [place].

## 2022-03-24 DIAGNOSIS — I5022 Chronic systolic (congestive) heart failure: Secondary | ICD-10-CM | POA: Diagnosis not present

## 2022-03-24 DIAGNOSIS — Z8679 Personal history of other diseases of the circulatory system: Secondary | ICD-10-CM | POA: Diagnosis not present

## 2022-03-24 DIAGNOSIS — Z9889 Other specified postprocedural states: Secondary | ICD-10-CM | POA: Diagnosis not present

## 2022-03-24 DIAGNOSIS — I429 Cardiomyopathy, unspecified: Secondary | ICD-10-CM | POA: Diagnosis not present

## 2022-03-24 DIAGNOSIS — I501 Left ventricular failure: Secondary | ICD-10-CM | POA: Diagnosis not present

## 2022-03-25 ENCOUNTER — Other Ambulatory Visit: Payer: Medicare Other

## 2022-03-25 DIAGNOSIS — I5022 Chronic systolic (congestive) heart failure: Secondary | ICD-10-CM | POA: Diagnosis not present

## 2022-03-26 LAB — BASIC METABOLIC PANEL
BUN/Creatinine Ratio: 21 (ref 12–28)
BUN: 19 mg/dL (ref 8–27)
CO2: 22 mmol/L (ref 20–29)
Calcium: 9.1 mg/dL (ref 8.7–10.3)
Chloride: 101 mmol/L (ref 96–106)
Creatinine, Ser: 0.89 mg/dL (ref 0.57–1.00)
Glucose: 96 mg/dL (ref 70–99)
Potassium: 4.5 mmol/L (ref 3.5–5.2)
Sodium: 139 mmol/L (ref 134–144)
eGFR: 70 mL/min/{1.73_m2} (ref >59)

## 2022-03-26 LAB — PRO B NATRIURETIC PEPTIDE: NT-Pro BNP: 6816 pg/mL — ABNORMAL HIGH (ref 0–301)

## 2022-03-28 DIAGNOSIS — N3944 Nocturnal enuresis: Secondary | ICD-10-CM | POA: Diagnosis not present

## 2022-03-28 DIAGNOSIS — R3121 Asymptomatic microscopic hematuria: Secondary | ICD-10-CM | POA: Diagnosis not present

## 2022-03-28 DIAGNOSIS — N3946 Mixed incontinence: Secondary | ICD-10-CM | POA: Diagnosis not present

## 2022-03-30 ENCOUNTER — Telehealth: Payer: Self-pay | Admitting: Internal Medicine

## 2022-03-30 NOTE — Telephone Encounter (Signed)
Pt reports last night she started having some SOB when she walked to go bed.  She is noticing increase in phlegm and not sleeping well.  Not sleeping in recliner but has to be "vertical" in bed. Weight has been at 126 for last several days.  Was down to 121 during the last week of May.  Was up to 140 when this started.  She ate some out of the ordinary foods over the weekend that probably contained extra sodium.  Otherwise she watches her sodium intake closely.  She had taken lasix 40 mg BID previously and was decreased to daily most recently.    She said she feels unwell and would like to go to the next step of a right heart catheterization.  In the meantime, I asked her to to increase Lasix 40 mg to BID (from daily) for TWO DAYS.  Her labs on 6/9 indicate kidney function wnl.  She will call back on Friday with update on her symptoms.    She is aware that I will forward to Dr. Ali Lowe to let him know and call her back if there are different/additional recommendations.

## 2022-03-30 NOTE — Telephone Encounter (Signed)
Pt c/o BP issue: STAT if pt c/o blurred vision, one-sided weakness or slurred speech  1. What are your last 5 BP readings?  6/16 122/84 HR 81 6/15 119/78 HR 81 6/12 114/76 HR 80 6/11 107/71 HR 72   2. Are you having any other symptoms (ex. Dizziness, headache, blurred vision, passed out)? Having SOB, coughing with phlegm.   3. What is your BP issue? BP is increasing.

## 2022-04-01 ENCOUNTER — Telehealth: Payer: Self-pay | Admitting: Internal Medicine

## 2022-04-01 ENCOUNTER — Encounter: Payer: Self-pay | Admitting: *Deleted

## 2022-04-01 NOTE — Telephone Encounter (Signed)
Did not need this encounter °

## 2022-04-01 NOTE — Telephone Encounter (Signed)
Scheduled patient for right heart cath for 04/07/22 with Dr. Ali Lowe dx: dyspnea.  Will send instruction letter to her in [place] for review.

## 2022-04-01 NOTE — Telephone Encounter (Signed)
Patient called w update.  She also sent a mychart message:  Big improvement since increasing Lasik dosage to twice a day.   Weight has returned 123.2  it was 126.6 on Tuesday.   Blood pressure today is 114/74 with pulse 79. Oxygen is 99%.  This is also better than Wednesday's reading of 121/79   Should I continue taking the Lasix twice a day?   Will you make appointment for next week with Dr Chauncey Cruel so that we move forward on catheter test?   Thank you so much for helping me. I feel so much better the last two days and have not had any shortness of breath.  ____________________________________________________________________________________________     Message from Dr. Ali Lowe:  Lets set her up for a RHC re dyspnea.  _____________________________________________________________________________  Adv patient we can arrange [place] for 04/07/22.  She is going to take lasix 40 mg BID today (Day3) and then go back to daily.  Will review with Dr. Ali Lowe whether he would want updated labs.

## 2022-04-04 ENCOUNTER — Other Ambulatory Visit: Payer: Self-pay | Admitting: *Deleted

## 2022-04-04 DIAGNOSIS — R0609 Other forms of dyspnea: Secondary | ICD-10-CM

## 2022-04-04 DIAGNOSIS — Z01812 Encounter for preprocedural laboratory examination: Secondary | ICD-10-CM

## 2022-04-04 NOTE — Progress Notes (Signed)
CBC prior to Right Heart Cath 04/07/22

## 2022-04-05 DIAGNOSIS — R0609 Other forms of dyspnea: Secondary | ICD-10-CM | POA: Diagnosis not present

## 2022-04-05 LAB — CBC
Hematocrit: 35.3 % (ref 34.0–46.6)
Hemoglobin: 11.1 g/dL (ref 11.1–15.9)
MCH: 26.3 pg — ABNORMAL LOW (ref 26.6–33.0)
MCHC: 31.4 g/dL — ABNORMAL LOW (ref 31.5–35.7)
MCV: 84 fL (ref 79–97)
Platelets: 275 10*3/uL (ref 150–450)
RBC: 4.22 x10E6/uL (ref 3.77–5.28)
RDW: 15.7 % — ABNORMAL HIGH (ref 11.7–15.4)
WBC: 5.5 10*3/uL (ref 3.4–10.8)

## 2022-04-06 ENCOUNTER — Telehealth: Payer: Self-pay | Admitting: *Deleted

## 2022-04-06 NOTE — Telephone Encounter (Signed)
Right Heart Cath scheduled at [facility] for: Thursday April 07, 2022 10:30 AM Arrival time and place: [facility] Main Entrance A at: 8:30 AM   Nothing to eat after midnight prior to procedure, clear liquids until 5 AM day of procedure.  Medication instructions: -Hold:  Lasix/KCl-AM of procedure  -Except hold medications usual morning medications can be taken with sips of water.  Confirmed patient has responsible adult to drive home post procedure and be with patient first 24 hours after arriving home.  Patient reports no new symptoms concerning for COVID-19 in the past 10 days.  Reviewed instructions with patient.

## 2022-04-07 ENCOUNTER — Ambulatory Visit (HOSPITAL_COMMUNITY)
Admission: RE | Admit: 2022-04-07 | Discharge: 2022-04-07 | Disposition: A | Discharge status: Home / Self Care | Payer: Medicare Other | Attending: Internal Medicine | Admitting: Internal Medicine

## 2022-04-07 ENCOUNTER — Ambulatory Visit (HOSPITAL_COMMUNITY)
Admission: RE | Disposition: A | Discharge status: Home / Self Care | Payer: Self-pay | Source: Home / Self Care | Attending: Internal Medicine

## 2022-04-07 ENCOUNTER — Other Ambulatory Visit: Payer: Self-pay

## 2022-04-07 ENCOUNTER — Encounter (HOSPITAL_COMMUNITY): Payer: Self-pay | Admitting: Internal Medicine

## 2022-04-07 ENCOUNTER — Telehealth: Payer: Self-pay

## 2022-04-07 DIAGNOSIS — R0600 Dyspnea, unspecified: Secondary | ICD-10-CM

## 2022-04-07 DIAGNOSIS — Z79899 Other long term (current) drug therapy: Secondary | ICD-10-CM | POA: Insufficient documentation

## 2022-04-07 DIAGNOSIS — E785 Hyperlipidemia, unspecified: Secondary | ICD-10-CM | POA: Diagnosis not present

## 2022-04-07 DIAGNOSIS — R0609 Other forms of dyspnea: Secondary | ICD-10-CM

## 2022-04-07 DIAGNOSIS — I429 Cardiomyopathy, unspecified: Secondary | ICD-10-CM | POA: Diagnosis not present

## 2022-04-07 DIAGNOSIS — I5022 Chronic systolic (congestive) heart failure: Secondary | ICD-10-CM | POA: Insufficient documentation

## 2022-04-07 HISTORY — PX: RIGHT HEART CATH: CATH118263

## 2022-04-07 LAB — POCT I-STAT EG7
Acid-Base Excess: 2 mmol/L (ref 0.0–2.0)
Acid-Base Excess: 1 mmol/L (ref 0.0–2.0)
Acid-Base Excess: 1 mmol/L (ref 0.0–2.0)
Acid-Base Excess: 2 mmol/L (ref 0.0–2.0)
Acid-Base Excess: 2 mmol/L (ref 0.0–2.0)
Acid-Base Excess: 2 mmol/L (ref 0.0–2.0)
Bicarbonate: 25.2 mmol/L (ref 20.0–28.0)
Bicarbonate: 24 mmol/L (ref 20.0–28.0)
Bicarbonate: 24.6 mmol/L (ref 20.0–28.0)
Bicarbonate: 25.4 mmol/L (ref 20.0–28.0)
Bicarbonate: 25.6 mmol/L (ref 20.0–28.0)
Bicarbonate: 25.7 mmol/L (ref 20.0–28.0)
Calcium, Ion: 1.22 mmol/L (ref 1.15–1.40)
Calcium, Ion: 1.16 mmol/L (ref 1.15–1.40)
Calcium, Ion: 1.17 mmol/L (ref 1.15–1.40)
Calcium, Ion: 1.18 mmol/L (ref 1.15–1.40)
Calcium, Ion: 1.19 mmol/L (ref 1.15–1.40)
Calcium, Ion: 1.2 mmol/L (ref 1.15–1.40)
HCT: 33 % — ABNORMAL LOW (ref 36.0–46.0)
HCT: 31 % — ABNORMAL LOW (ref 36.0–46.0)
HCT: 31 % — ABNORMAL LOW (ref 36.0–46.0)
HCT: 32 % — ABNORMAL LOW (ref 36.0–46.0)
HCT: 32 % — ABNORMAL LOW (ref 36.0–46.0)
HCT: 32 % — ABNORMAL LOW (ref 36.0–46.0)
Hemoglobin: 11.2 g/dL — ABNORMAL LOW (ref 12.0–15.0)
Hemoglobin: 10.5 g/dL — ABNORMAL LOW (ref 12.0–15.0)
Hemoglobin: 10.5 g/dL — ABNORMAL LOW (ref 12.0–15.0)
Hemoglobin: 10.9 g/dL — ABNORMAL LOW (ref 12.0–15.0)
Hemoglobin: 10.9 g/dL — ABNORMAL LOW (ref 12.0–15.0)
Hemoglobin: 10.9 g/dL — ABNORMAL LOW (ref 12.0–15.0)
O2 Saturation: 63 %
O2 Saturation: 57 %
O2 Saturation: 58 %
O2 Saturation: 63 %
O2 Saturation: 66 %
O2 Saturation: 70 %
Potassium: 3.9 mmol/L (ref 3.5–5.1)
Potassium: 3.8 mmol/L (ref 3.5–5.1)
Potassium: 3.8 mmol/L (ref 3.5–5.1)
Potassium: 3.8 mmol/L (ref 3.5–5.1)
Potassium: 3.8 mmol/L (ref 3.5–5.1)
Potassium: 3.9 mmol/L (ref 3.5–5.1)
Sodium: 140 mmol/L (ref 135–145)
Sodium: 138 mmol/L (ref 135–145)
Sodium: 139 mmol/L (ref 135–145)
Sodium: 139 mmol/L (ref 135–145)
Sodium: 139 mmol/L (ref 135–145)
Sodium: 139 mmol/L (ref 135–145)
TCO2: 26 mmol/L (ref 22–32)
TCO2: 25 mmol/L (ref 22–32)
TCO2: 26 mmol/L (ref 22–32)
TCO2: 26 mmol/L (ref 22–32)
TCO2: 27 mmol/L (ref 22–32)
TCO2: 27 mmol/L (ref 22–32)
pCO2, Ven: 34.9 mmHg — ABNORMAL LOW (ref 44–60)
pCO2, Ven: 31.4 mmHg — ABNORMAL LOW (ref 44–60)
pCO2, Ven: 34.2 mmHg — ABNORMAL LOW (ref 44–60)
pCO2, Ven: 34.6 mmHg — ABNORMAL LOW (ref 44–60)
pCO2, Ven: 36 mmHg — ABNORMAL LOW (ref 44–60)
pCO2, Ven: 37.2 mmHg — ABNORMAL LOW (ref 44–60)
pH, Ven: 7.466 — ABNORMAL HIGH (ref 7.25–7.43)
pH, Ven: 7.448 — ABNORMAL HIGH (ref 7.25–7.43)
pH, Ven: 7.459 — ABNORMAL HIGH (ref 7.25–7.43)
pH, Ven: 7.465 — ABNORMAL HIGH (ref 7.25–7.43)
pH, Ven: 7.473 — ABNORMAL HIGH (ref 7.25–7.43)
pH, Ven: 7.491 — ABNORMAL HIGH (ref 7.25–7.43)
pO2, Ven: 31 mmHg — CL (ref 32–45)
pO2, Ven: 28 mmHg — CL (ref 32–45)
pO2, Ven: 29 mmHg — CL (ref 32–45)
pO2, Ven: 31 mmHg — CL (ref 32–45)
pO2, Ven: 32 mmHg (ref 32–45)
pO2, Ven: 33 mmHg (ref 32–45)

## 2022-04-07 SURGERY — RIGHT HEART CATH

## 2022-04-07 MED ORDER — SODIUM CHLORIDE 0.9 % IV SOLN: INTRAVENOUS | Status: DC

## 2022-04-07 MED ORDER — SODIUM CHLORIDE 0.9% FLUSH: 3.0000 mL | INTRAVENOUS | Status: DC | PRN

## 2022-04-07 MED ORDER — MIDAZOLAM HCL 2 MG/2ML IJ SOLN
INTRAMUSCULAR | Status: AC
  Filled 2022-04-07: qty 2

## 2022-04-07 MED ORDER — ACETAMINOPHEN 325 MG PO TABS: 650.0000 mg | ORAL_TABLET | ORAL | Status: DC | PRN

## 2022-04-07 MED ORDER — HYDRALAZINE HCL 20 MG/ML IJ SOLN: 10.0000 mg | INTRAMUSCULAR | Status: DC | PRN

## 2022-04-07 MED ORDER — LABETALOL HCL 5 MG/ML IV SOLN: 10.0000 mg | INTRAVENOUS | Status: DC | PRN

## 2022-04-07 MED ORDER — FENTANYL CITRATE (PF) 100 MCG/2ML IJ SOLN
INTRAMUSCULAR | Status: AC
  Filled 2022-04-07: qty 2

## 2022-04-07 MED ORDER — HEPARIN (PORCINE) IN NACL 1000-0.9 UT/500ML-% IV SOLN
INTRAVENOUS | Status: AC
Start: 2022-04-07 — End: ?
  Filled 2022-04-07: qty 500

## 2022-04-07 MED ORDER — FENTANYL CITRATE (PF) 100 MCG/2ML IJ SOLN: INTRAMUSCULAR | Status: DC | PRN

## 2022-04-07 MED ORDER — SODIUM CHLORIDE 0.9 % IV SOLN: 250.0000 mL | INTRAVENOUS | Status: DC | PRN

## 2022-04-07 MED ORDER — MIDAZOLAM HCL 2 MG/2ML IJ SOLN: INTRAMUSCULAR | Status: DC | PRN

## 2022-04-07 MED ORDER — LIDOCAINE HCL (PF) 1 % IJ SOLN
INTRAMUSCULAR | Status: AC
  Filled 2022-04-07: qty 30

## 2022-04-07 MED ORDER — LIDOCAINE HCL (PF) 1 % IJ SOLN
INTRAMUSCULAR | Status: DC | PRN
  Administered 2022-04-07: 2 mL

## 2022-04-07 MED ORDER — SODIUM CHLORIDE 0.9% FLUSH: 3.0000 mL | Freq: Two times a day (BID) | INTRAVENOUS | Status: DC

## 2022-04-07 MED ORDER — ONDANSETRON HCL 4 MG/2ML IJ SOLN: 4.0000 mg | Freq: Four times a day (QID) | INTRAMUSCULAR | Status: DC | PRN

## 2022-04-07 MED ORDER — HEPARIN (PORCINE) IN NACL 1000-0.9 UT/500ML-% IV SOLN
INTRAVENOUS | Status: DC | PRN
  Administered 2022-04-07: 500 mL

## 2022-04-07 SURGICAL SUPPLY — 6 items
CATH BALLN WEDGE 5F 110CM (CATHETERS) ×1 IMPLANT
GUIDEWIRE .025 260CM (WIRE) ×1 IMPLANT
SHEATH GLIDE SLENDER 4/5FR (SHEATH) ×1 IMPLANT
TRANSDUCER W/STOPCOCK (MISCELLANEOUS) ×1 IMPLANT
TUBING ART PRESS 72  MALE/FEM (TUBING) ×2
TUBING ART PRESS 72 MALE/FEM (TUBING) IMPLANT

## 2022-04-07 NOTE — Telephone Encounter (Signed)
-----   Message from Early Osmond, MD sent at 04/07/2022  1:00 PM EDT ----- Please schedule patient for echo re: dyspnea and elevated V waves on RHC.  Thanks.

## 2022-04-07 NOTE — Interval H&P Note (Signed)
History and Physical Interval Note:  04/07/2022 9:01 AM  [name]  has presented today for surgery, with the diagnosis of dyspnea.  The various methods of treatment have been discussed with the patient and family. After consideration of risks, benefits and other options for treatment, the patient has consented to  Procedure(s): RIGHT HEART CATH (N/A) as a surgical intervention.  The patient's history has been reviewed, patient examined, no change in status, stable for surgery.  I have reviewed the patient's chart and labs.  Questions were answered to the patient's satisfaction.     Early Osmond

## 2022-04-08 MED FILL — Midazolam HCl Inj 2 MG/2ML (Base Equivalent): INTRAMUSCULAR | Qty: 2 | Status: AC

## 2022-04-08 MED FILL — Fentanyl Citrate Preservative Free (PF) Inj 100 MCG/2ML: INTRAMUSCULAR | Qty: 2 | Status: AC

## 2022-04-13 ENCOUNTER — Ambulatory Visit (HOSPITAL_COMMUNITY): Payer: Medicare Other | Attending: Cardiology

## 2022-04-13 DIAGNOSIS — R0609 Other forms of dyspnea: Secondary | ICD-10-CM | POA: Insufficient documentation

## 2022-04-13 LAB — ECHOCARDIOGRAM COMPLETE
Area-P 1/2: 5.54 cm2
MV M vel: 4.38 m/s
MV Peak grad: 76.7 mmHg
Radius: 0.3 cm
S' Lateral: 4.5 cm

## 2022-04-14 DIAGNOSIS — E039 Hypothyroidism, unspecified: Secondary | ICD-10-CM | POA: Diagnosis not present

## 2022-04-14 DIAGNOSIS — R7301 Impaired fasting glucose: Secondary | ICD-10-CM | POA: Diagnosis not present

## 2022-04-15 ENCOUNTER — Encounter: Payer: Self-pay | Admitting: Internal Medicine

## 2022-04-15 ENCOUNTER — Ambulatory Visit (INDEPENDENT_AMBULATORY_CARE_PROVIDER_SITE_OTHER): Payer: Medicare Other | Admitting: Internal Medicine

## 2022-04-15 VITALS — BP 100/60 | HR 72 | Ht 63.0 in | Wt 125.8 lb

## 2022-04-15 DIAGNOSIS — Z8679 Personal history of other diseases of the circulatory system: Secondary | ICD-10-CM | POA: Diagnosis not present

## 2022-04-15 DIAGNOSIS — I5022 Chronic systolic (congestive) heart failure: Secondary | ICD-10-CM | POA: Diagnosis not present

## 2022-04-15 DIAGNOSIS — E785 Hyperlipidemia, unspecified: Secondary | ICD-10-CM | POA: Diagnosis not present

## 2022-04-15 DIAGNOSIS — I428 Other cardiomyopathies: Secondary | ICD-10-CM | POA: Diagnosis not present

## 2022-04-15 DIAGNOSIS — Z9889 Other specified postprocedural states: Secondary | ICD-10-CM

## 2022-04-15 MED ORDER — LOSARTAN POTASSIUM 25 MG PO TABS: 25.0000 mg | ORAL_TABLET | Freq: Every day | ORAL | 3 refills | Status: DC

## 2022-04-15 MED ORDER — SPIRONOLACTONE 25 MG PO TABS
25.0000 mg | ORAL_TABLET | Freq: Every day | ORAL | 3 refills | Status: DC
Start: 2022-04-15 — End: 2022-05-05

## 2022-04-15 MED ORDER — SPIRONOLACTONE 25 MG PO TABS: 25.0000 mg | ORAL_TABLET | Freq: Every day | ORAL | 3 refills | Status: DC

## 2022-04-15 MED ORDER — EMPAGLIFLOZIN 10 MG PO TABS: 10.0000 mg | ORAL_TABLET | Freq: Every day | ORAL | 3 refills | Status: DC

## 2022-04-15 NOTE — Patient Instructions (Signed)
Medication Instructions:  Your physician has recommended you make the following change in your medication:  1.) stop atorvastatin 2.) start losartan 25 mg daily at bedtime 3.) start spironolactone 25 mg daily at bedtime 4.) start Jardiance 10 mg daily before breakfast  *If you need a refill on your cardiac medications before your next appointment, please call your pharmacy*   Lab Work: In about one week (BMET, CBC)  If you have labs (blood work) drawn today and your tests are completely normal, you will receive your results only by: [place] (if you have MyChart) OR A paper copy in the mail If you have any lab test that is abnormal or we need to change your treatment, we will call you to review the results.   Testing/Procedures:  Cardiac MRI - see instructions below.  ECHO DUE IN 3 MONTHS Your physician has requested that you have an echocardiogram. Echocardiography is a painless test that uses sound waves to create images of your heart. It provides your doctor with information about the size and shape of your heart and how well your heart's chambers and valves are working. This procedure takes approximately one hour. There are no restrictions for this procedure.   Follow-Up: At [facility], you and your health needs are our priority.  As part of our continuing mission to provide you with exceptional heart care, we have created designated Provider Care Teams.  These Care Teams include your primary Cardiologist (physician) and Advanced Practice Providers (APPs -  Physician Assistants and Nurse Practitioners) who all work together to provide you with the care you need, when you need it.   Your next appointment:   3 month(s)  The format for your next appointment:   In Person  Provider:   Early Osmond, MD     Other Instructions   You are scheduled for Cardiac MRI on ______________. Please arrive for your appointment at ______________ ( arrive 30-45 minutes prior  to test start time). ?  [facility] [address] [place], [place] [address] [phone] Please take advantage of the free valet parking available at the MAIN entrance (A entrance).  Proceed to the [facility] Radiology Department (First Floor) for check-in.    Magnetic resonance imaging (MRI) is a painless test that produces images of the inside of the body without using Xrays.  During an MRI, strong magnets and radio waves work together in a [employer] officer, political party to form detailed images.   MRI images may provide more details about a medical condition than X-rays, CT scans, and ultrasounds can provide.  You may be given earphones to listen for instructions.  You may eat a light breakfast and take medications as ordered with the exception of Lasix (fluid pill). Please avoid stimulants for 12 hr prior to test. (Ie. Caffeine, nicotine, chocolate, or antihistamine medications)  If a contrast material will be used, an IV will be inserted into one of your veins. Contrast material will be injected into your IV. It will leave your body through your urine within a day. You may be told to drink plenty of fluids to help flush the contrast material out of your system.  You will be asked to remove all metal, including: Watch, jewelry, and other metal objects including hearing aids, hair pieces and dentures. Also wearable glucose monitoring systems (ie. Freestyle Libre and Omnipods) (Braces and fillings normally are not a problem.)   TEST WILL TAKE APPROXIMATELY 1 HOUR  PLEASE NOTIFY SCHEDULING AT LEAST 24  HOURS IN ADVANCE IF YOU ARE UNABLE TO KEEP YOUR APPOINTMENT. [phone]  Please call Marchia Bond, cardiac imaging nurse navigator with any questions/concerns. Marchia Bond RN Navigator Cardiac Imaging Gordy Clement RN Navigator Cardiac Imaging [facility] [phone] Office

## 2022-04-15 NOTE — Progress Notes (Signed)
Cardiology Office Note:    Date:  04/15/2022   ID:  [name], DOB 1951/05/07, MRN [id number]  PCP:  [name], [place] Prov[facility] Cardiologist:  [name], MD Referring MD: [name], FNP   Chief Complaint/Reason for Referral: Heart failure  ASSESSMENT:    1. NICM (nonischemic cardiomyopathy) ([place])   2. Chronic systolic heart failure ([place])   3. Hyperlipidemia, unspecified hyperlipidemia type   4. H/O radiofrequency ablation for complex left atrial arrhythmia       PLAN:    In order of problems listed above: 1.  NICM: Start Jardiance 10 mg daily, continue Toprol-XL 37.'5mg'$  QPM, start losartan '25mg'$  QPM (instead of [place] due to low BP), and start spironolactone 25 mg QPM.  Continue lasix '40mg'$  qday.  Check BMP and CBC in 1 week.  She had a coronary CTA which demonstrated no obstructive coronary artery disease.  We will refer for cardiac MRI.  Refer to pharmacy for HF titration.  Follow up in 3 months with echocardiogram re ICD. 2.  Chronic systolic heart failure: See discussion above. 3.  Hyperlipidemia: This is being followed by patient's primary care provider.  Her coronary artery calcium score was 0.  She lacks aortic atherosclerosis or coronary artery disease so this may be able to be managed by diet alone.  Discontinue atorvastatin. 4.  AVNRT ablation: Continue Toprol for now.  Her recent monitor showed limited SVT.   Dispo:  Return in about 3 months (around 07/16/2022).      Medication Adjustments/Labs and Tests Ordered: Current medicines are reviewed at length with the patient today.  Concerns regarding medicines are outlined above.  The following changes have been made:     Labs/tests ordered: Orders Placed This Encounter  Procedures   MR CARDIAC MORPHOLOGY WO CONTRAST   Hemoglobin and hematocrit, blood   Basic Metabolic Panel (BMET)   AMB Referral to [facility]   ECHOCARDIOGRAM COMPLETE    Medication  Changes: Meds ordered this encounter  Medications   empagliflozin (JARDIANCE) 10 MG TABS tablet    Sig: Take 1 tablet (10 mg total) by mouth daily before breakfast.    Dispense:  30 tablet    Refill:  3   DISCONTD: losartan (COZAAR) 25 MG tablet    Sig: Take 1 tablet (25 mg total) by mouth daily.    Dispense:  90 tablet    Refill:  3   DISCONTD: spironolactone (ALDACTONE) 25 MG tablet    Sig: Take 1 tablet (25 mg total) by mouth daily.    Dispense:  90 tablet    Refill:  3   losartan (COZAAR) 25 MG tablet    Sig: Take 1 tablet (25 mg total) by mouth at bedtime.    Dispense:  90 tablet    Refill:  3   spironolactone (ALDACTONE) 25 MG tablet    Sig: Take 1 tablet (25 mg total) by mouth at bedtime.    Dispense:  90 tablet    Refill:  3     Current medicines are reviewed at length with the patient today.  The patient does not have concerns regarding medicines.   History of Present Illness:    FOCUSED PROBLEM LIST:   1.  AVNRT status post catheter ablation 2015 2.  Hyperlipidemia 3.  Family history of coronary artery disease 4.  Coronary artery calcium score 2020 of 0 5.  Cardiomyopathy with ejection fraction of 35 to 40%; coronary CTA 2023 with nonobstructive  coronary artery disease  May 2023 Initial consultation:  The patient is a 71 y.o. female with the indicated medical history here to establish cardiovascular care.  The patient had been cared for Dr. [name] for some time.  She had a coronary artery calcium score of 0 in 2020 and an echocardiogram last month demonstrated an ejection fraction of 45 to 50% with no significant valvular abnormalities with global hypokinesis.  She was admitted recently with acute on chronic systolic and diastolic heart failure.  She received a dose of diuretic and was discharged home.  She is here to after hospital admission.  Her troponins were negative however her BNP was elevated.  Her chest x-ray demonstrated pulmonary edema.    She tells me that  she has been short of breath and fatigued for some time.  She also feels palpitations at night.  Her weight has been going down however and she is on a fluid restriction.  She has noted some peripheral edema.  She does not note any orthopnea or paroxysmal nocturnal dyspnea.  She occasionally gets lightheaded at times.  She has had no frank syncope.  She denies any exertional angina.  Patient diltiazem was discontinued due to the mild cardiomyopathy she had developed.  Additionally Toprol XL 25 mg at bedtime and Lasix 20 mg twice daily was initiated.  Patient was referred for coronary CTA.    May 2023: In the interim the patient had a coronary CTA which was negative with a score of 0.  It did show bilateral pleural effusions.  The patient's event monitor demonstrated 11 runs of supraventricular ventricular tachycardia as well as 5 runs of nonsustained ventricular tachycardia.  Her Toprol-XL was increased to 37.5 mg.  A proBNP was ordered and found to be over 7500.  The increased dose of the Lasix to 40 mg twice daily has helped over the last day.  The patient feels less short of breath and overall better.  She does still complain of orthopnea however she has had no significant peripheral edema.  Her fatigue level seems to be better as well as her shortness of breath.  She was referred for right heart catheterization which demonstrated elevated V waves but preserved cardiac output and index.  She was also referred for an echocardiogram which demonstrated worsening LV function with ejection fraction of 35 to 40% with global hypokinesis and no significant valvular abnormalities.  Today: The patient is seen to discuss test results.  She has been feeling pretty well.  She denies any shortness of breath.  She has been exercising moderately.  She occasionally feels palpitations at night.  She denies any exertional angina.  She denies any peripheral edema.  She has had no presyncope or syncope.  Current  Medications: Current Meds  Medication Sig   ALPRAZolam (XANAX) 0.25 MG tablet Take 0.25 mg by mouth 2 (two) times daily as needed for anxiety or sleep.   ciclopirox (LOPROX) 0.77 % cream Apply 1 application. topically 2 (two) times daily as needed (foot fungus).   clotrimazole (LOTRIMIN) 1 % cream as needed.   empagliflozin (JARDIANCE) 10 MG TABS tablet Take 1 tablet (10 mg total) by mouth daily before breakfast.   FLUoxetine (PROZAC) 40 MG capsule Take 40 mg by mouth every morning.   fluticasone (FLONASE) 50 MCG/ACT nasal spray Place 1 spray into both nostrils at bedtime as needed for allergies or rhinitis.   furosemide (LASIX) 40 MG tablet Take 1 tablet (40 mg total) by mouth daily.  levothyroxine (SYNTHROID) 25 MCG tablet Take 37.5 mcg by mouth daily before breakfast.   melatonin 3 MG TABS tablet Take 6 mg by mouth at bedtime as needed (sleep).   metoprolol succinate (TOPROL XL) 25 MG 24 hr tablet Take 1.5 tablets (37.5 mg total) by mouth at bedtime.   Multiple Vitamin (MULTIVITAMIN) tablet Take 1 tablet by mouth daily.   Polyethyl Glycol-Propyl Glycol (SYSTANE) 0.4-0.3 % SOLN Place 1 drop into both eyes daily as needed (dry eyes).   potassium chloride (KLOR-CON) 10 MEQ tablet Take 2 tablets (20 mEq total) by mouth daily.   [DISCONTINUED] atorvastatin (LIPITOR) 20 MG tablet Take 20 mg by mouth every other day.   [DISCONTINUED] losartan (COZAAR) 25 MG tablet Take 1 tablet (25 mg total) by mouth daily.   [DISCONTINUED] spironolactone (ALDACTONE) 25 MG tablet Take 1 tablet (25 mg total) by mouth daily.     Allergies:    Crestor [rosuvastatin calcium], Pravastatin, and Propranolol   Social History:   Social History   Tobacco Use   Smoking status: Never   Smokeless tobacco: Never  Vaping Use   Vaping Use: Never used  Substance Use Topics   Alcohol use: Yes    Alcohol/week: 1.0 standard drink of alcohol    Types: 1 Glasses of wine per week    Comment: socially   Drug use: No      Family Hx: Family History  Problem Relation Age of Onset   Heart attack Mother 79   Pancreatic cancer Mother    Heart attack Father 21   Heart attack Sister 54   Heart disease Sister    Hernia Paternal Aunt    Colon cancer Paternal Uncle 6   Heart attack Maternal Grandmother    Heart attack Paternal Grandmother    Diabetes Nephew    Other Son        Avascular acrosis   Clotting disorder Son    Rectal cancer Neg Hx    Stomach cancer Neg Hx    Esophageal cancer Neg Hx      Review of Systems:   Please see the history of present illness.    All other systems reviewed and are negative.     EKGs/Labs/Other Test Reviewed:    EKG:  EKG performed April 2023 that I personally reviewed demonstrates sinus rhythm with lateral infarction pattern.  EKG today demonstrates sinus rhythm with first-degree AV blockd  Prior CV studies:  TTE 2023 with ejection fraction of 35 to 40% with global hypokinesis, grade 3 diastolic dysfunction with restrictive filling, moderately dilated left atrium and mild mitral regurgitation  Right heart catheterization 2023 with a cardiac output of 4.1 L/min and index of 2.6 L/min/m with V waves to 38 mmHg with a mean wedge pressure 24 mmHg, mean RA pressure of 1 mmHg, mean PA pressure of 20 mmHg, and normal PA pulsatility index  Monitor 2023 15 episodes of nonsustained ventricular tachycardia and 11 episodes of supraventricular tachycardia with the longest lasting 14 seconds  Coronary CTA 2023 calcium score of 0 without obstructive coronary artery disease TTE 2023 with ejection fraction of 45 to 50% with mild global hypokinesis and no significant valvular abnormalities  Monitor 2023 demonstrates sinus rhythm with occasional first-degree AV block and 20 ventricular tachycardic episodes lasting 10 beats and 36 atrial tachycardic episodes lasting 12.5 seconds  ETT 2017 low risk with DTS of 9  Other studies Reviewed: Review of the additional studies/records  demonstrates: CT abdomen pelvis 2021 without aortic atherosclerosis  Recent  Labs: 01/25/2022: ALT 34; TSH 6.406 01/28/2022: B Natriuretic Peptide 1,619.7 01/29/2022: Magnesium 1.7 03/25/2022: BUN 19; Creatinine, Ser 0.89; NT-Pro BNP 6,816 04/05/2022: Platelets 275 04/07/2022: Hemoglobin 10.9; Potassium 3.8; Sodium 139   Recent Lipid Panel Lab Results  Component Value Date/Time   CHOL 290 (H) 01/18/2012 09:10 AM   TRIG 127.0 01/18/2012 09:10 AM   HDL 54.40 01/18/2012 09:10 AM   LDLDIRECT 209.4 01/18/2012 09:10 AM   Recent labs from June demonstrated a hemoglobin of 10.6, platelets 219, creatinine of 0.83, sodium 137, potassium 4.5, LFTs within normal limits.  Risk Assessment/Calculations:           Physical Exam:    VS:  BP 100/60   Pulse 72   Ht '5\' 3"'$  (1.6 m)   Wt 125 lb 12.8 oz (57.1 kg)   SpO2 98%   BMI 22.28 kg/m    Wt Readings from Last 3 Encounters:  04/15/22 125 lb 12.8 oz (57.1 kg)  04/07/22 124 lb 3.2 oz (56.3 kg)  03/10/22 130 lb (59 kg)    GENERAL:  No apparent distress, AOx3 HEENT:  No carotid bruits, +2 carotid impulses, no scleral icterus; +JVD CAR: RRR no murmurs, gallops, rubs, or thrills RES:  Clear to auscultation bilaterally ABD:  Soft, nontender, nondistended, positive bowel sounds x 4 VASC:  +2 radial pulses, +2 carotid pulses, palpable pedal pulses NEURO:  CN 2-12 grossly intact; motor and sensory grossly intact PSYCH:  No active depression or anxiety EXT:  No edema, ecchymosis, or cyanosis  Signed, Early Osmond, MD  04/15/2022 11:58 AM    [address], [place], [place]  [address] Phone: [phone]; Fax: [phone]   Note:  This document was prepared using Dragon voice recognition software and may include unintentional dictation errors.

## 2022-04-22 DIAGNOSIS — Z9889 Other specified postprocedural states: Secondary | ICD-10-CM | POA: Diagnosis not present

## 2022-04-22 DIAGNOSIS — Z8679 Personal history of other diseases of the circulatory system: Secondary | ICD-10-CM | POA: Diagnosis not present

## 2022-04-22 DIAGNOSIS — E785 Hyperlipidemia, unspecified: Secondary | ICD-10-CM | POA: Diagnosis not present

## 2022-04-22 DIAGNOSIS — I5022 Chronic systolic (congestive) heart failure: Secondary | ICD-10-CM | POA: Diagnosis not present

## 2022-04-22 DIAGNOSIS — I428 Other cardiomyopathies: Secondary | ICD-10-CM | POA: Diagnosis not present

## 2022-04-23 LAB — BASIC METABOLIC PANEL
BUN/Creatinine Ratio: 21 (ref 12–28)
BUN: 21 mg/dL (ref 8–27)
CO2: 23 mmol/L (ref 20–29)
Calcium: 9.6 mg/dL (ref 8.7–10.3)
Chloride: 96 mmol/L (ref 96–106)
Creatinine, Ser: 0.98 mg/dL (ref 0.57–1.00)
Glucose: 96 mg/dL (ref 70–99)
Potassium: 4.4 mmol/L (ref 3.5–5.2)
Sodium: 134 mmol/L (ref 134–144)
eGFR: 62 mL/min/{1.73_m2} (ref >59)

## 2022-04-23 LAB — HEMOGLOBIN AND HEMATOCRIT, BLOOD
Hematocrit: 35.9 % (ref 34.0–46.6)
Hemoglobin: 11.5 g/dL (ref 11.1–15.9)

## 2022-05-05 ENCOUNTER — Ambulatory Visit (INDEPENDENT_AMBULATORY_CARE_PROVIDER_SITE_OTHER): Payer: Medicare Other | Admitting: Pharmacist

## 2022-05-05 VITALS — BP 130/80 | HR 71 | Wt 126.0 lb

## 2022-05-05 DIAGNOSIS — D508 Other iron deficiency anemias: Secondary | ICD-10-CM | POA: Insufficient documentation

## 2022-05-05 DIAGNOSIS — I5022 Chronic systolic (congestive) heart failure: Secondary | ICD-10-CM

## 2022-05-05 NOTE — Progress Notes (Signed)
Patient ID: [name]                 DOB: 1950/12/15                      MRN: [id number]     HPI: [name] is a 71 y.o. female referred by Dr. Ali Lowe to pharmacy clinic for HF medication management. PMH is significant for radiofrequency ablation for complex left atrial arrhythmia, CHF, HLD, CAC score of 0. Most recent LVEF 35-40% on 04/13/22.  At last visit with MD on 6/30 patient was started on Jardiance '10mg'$  daily, losartan '25mg'$  daily and spironolactone '25mg'$  daily. Repeat labs were stable. Patient presents today accompanied by her son. She reports some dizziness. Home blood pressures have been in the lower side. Some very low at 69/50, 72/49, 89/56. She is concerned because he weight is way down and around 4:00 PM the skin on her arms starts to roll and sag. She shows we pictures. Concerned about dehydration or malnutrition. She is down 17lb from prior to hospitalization for CHF. She does report some erratic SOB. She is able to go to the gym and ride bike, walk on treadmill and lift weights with a trainer without SOB. She will sometimes randomly get SOB carrying groceries or lying down. She does have GERD that causes her to cough at night. Sleeps propped up. Heart feels odd when she bends down. BP cuff will sometimes tell her she has an irregular heart rhythm.   Son and patient have lots of great questions about supplements, nutrition and exercise. Wanting a referral to a nutritionist.  Patient concerned about her family hx of sudden cardiac death.   Current CHF meds: losartan '25mg'$  daily (not taking), spironolactone '25mg'$  daily (not taking), Jardiance '10mg'$  daily, metoprolol succinate 37.'5mg'$  daily, furosemide '40mg'$  daily, KCL 20 MEQ daily BP goal: <130/80  Family History:  Family History  Problem Relation Age of Onset   Heart attack Mother 43   Pancreatic cancer Mother    Heart attack Father 65   Heart attack Sister 17   Heart disease Sister    Hernia Paternal Aunt    Colon  cancer Paternal Uncle 69   Heart attack Maternal Grandmother    Heart attack Paternal Grandmother    Diabetes Nephew    Other Son        Avascular acrosis   Clotting disorder Son    Rectal cancer Neg Hx    Stomach cancer Neg Hx    Esophageal cancer Neg Hx      Social History:  Social History   Socioeconomic History   Marital status: Widowed    Spouse name: Not on file   Number of children: 1   Years of education: Not on file   Highest education level: Not on file  Occupational History   Occupation: Retired  Tobacco Use   Smoking status: Never   Smokeless tobacco: Never  Vaping Use   Vaping Use: Never used  Substance and Sexual Activity   Alcohol use: Yes    Alcohol/week: 1.0 standard drink of alcohol    Types: 1 Glasses of wine per week    Comment: socially   Drug use: No   Sexual activity: Not on file  Other Topics Concern   Not on file  Social History Narrative   Not on file   Social Determinants of Health   Financial Resource Strain: Not on file  Food Insecurity: Not on file  Transportation Needs: Not on file  Physical Activity: Not on file  Stress: Not on file  Social Connections: Not on file  Intimate Partner Violence: Not on file   Diet:   Exercise: bikes 3 miles, working out with trainer 2 days a week  Home BP readings: 114/71, 112/71, 106/70, 106/66, 99/67, 94/65, 91/56, 90/62, 89/56, 80/51, 72/49, 69/50, HR 60's  Wt Readings from Last 3 Encounters:  04/15/22 125 lb 12.8 oz (57.1 kg)  04/07/22 124 lb 3.2 oz (56.3 kg)  03/10/22 130 lb (59 kg)   BP Readings from Last 3 Encounters:  04/15/22 100/60  04/07/22 120/73  03/10/22 112/62   Pulse Readings from Last 3 Encounters:  04/15/22 72  04/07/22 71  03/10/22 78    Renal function: CrCl cannot be calculated (Unknown ideal weight.).  Past Medical History:  Diagnosis Date   Adenomatous polyp    Allergic rhinitis    Anxiety    AV Nodal Reentry Tachycardia    s/p RFCA GT 2/15   AVNRT  (AV nodal re-entry tachycardia) ([facility]) 11/06/2013   Calcified granuloma of lung ([facility])    Cataract    CHF (congestive heart failure) ([place])    Diverticulosis 01/2020   Gastritis    GERD (gastroesophageal reflux disease)    H/O radiofrequency ablation for complex left atrial arrhythmia 12/12/2013   Headache    Hematuria    Hemorrhoids    HTN (hypertension)    Hyperlipemia    Hypothyroidism    Insomnia    Iron deficiency    Irregular heart beat    Laryngopharyngeal reflux (LPR)    Migraine    Mitral valve prolapse    ?   Rectal leakage    1 time   Retinal tear of both eyes    Skin lesion of breast    Vitamin D deficiency    Vulvodynia     Current Outpatient Medications on File Prior to Visit  Medication Sig Dispense Refill   ALPRAZolam (XANAX) 0.25 MG tablet Take 0.25 mg by mouth 2 (two) times daily as needed for anxiety or sleep.     ciclopirox (LOPROX) 0.77 % cream Apply 1 application. topically 2 (two) times daily as needed (foot fungus).     clotrimazole (LOTRIMIN) 1 % cream as needed.     empagliflozin (JARDIANCE) 10 MG TABS tablet Take 1 tablet (10 mg total) by mouth daily before breakfast. 30 tablet 3   FLUoxetine (PROZAC) 40 MG capsule Take 40 mg by mouth every morning.     fluticasone (FLONASE) 50 MCG/ACT nasal spray Place 1 spray into both nostrils at bedtime as needed for allergies or rhinitis.     furosemide (LASIX) 40 MG tablet Take 1 tablet (40 mg total) by mouth daily. 90 tablet 3   levothyroxine (SYNTHROID) 25 MCG tablet Take 37.5 mcg by mouth daily before breakfast.     losartan (COZAAR) 25 MG tablet Take 1 tablet (25 mg total) by mouth at bedtime. 90 tablet 3   melatonin 3 MG TABS tablet Take 6 mg by mouth at bedtime as needed (sleep).     metoprolol succinate (TOPROL XL) 25 MG 24 hr tablet Take 1.5 tablets (37.5 mg total) by mouth at bedtime. 135 tablet 3   Multiple Vitamin (MULTIVITAMIN) tablet Take 1 tablet by mouth daily.     Polyethyl Glycol-Propyl Glycol  (SYSTANE) 0.4-0.3 % SOLN Place 1 drop into both eyes daily as needed (dry eyes).     potassium chloride (KLOR-CON) 10 MEQ tablet  Take 2 tablets (20 mEq total) by mouth daily. 180 tablet 3   spironolactone (ALDACTONE) 25 MG tablet Take 1 tablet (25 mg total) by mouth at bedtime. 90 tablet 3   No current facility-administered medications on file prior to visit.    Allergies  Allergen Reactions   Crestor [Rosuvastatin Calcium] Other (See Comments)    Muscle aches   Pravastatin Other (See Comments)    Muscle aches    Propranolol Other (See Comments)    Caused severe weakness     Assessment/Plan:  1. CHF -  Although blood pressure in clinic today is good, patient has several concerning low blood pressures at home. Patient has a history of UTI and is concerned about taking Jardiance. With her current regimen, would not be able to add losartan or spironolactone. I feel an ARB is more important for her to be on than an SGLT2, especially if she has a hx of UTI. Most of the date with SGLT2 is with decreased hospitalizations where the mortality data with ARBs is stronger. I have advised her to stop Jardiance. Will decrease furosemide to '20mg'$  daily but she will keep a close eye on her weight, swelling and SOB. Concerned that she is too dry. Will increase back to 40 if needed. Decrease KCL to 10MEQ while taking '20mg'$  of furosemide. I have asked her to send me her blood pressure readings in 2 weeks. If they are improved, then will add 12.'5mg'$  of losartan. Follow up in person in 3-4 weeks.  Advised pt she should take her iron if her PCP told her her iron was low. Recommended 1g/kg of protein per day (son had asked).   Thank you,  Ramond Dial, Pharm.D, BCPS, CPP [facility]  [address], [place], [place] [address]  Phone: [phone]; Fax: [phone]

## 2022-05-05 NOTE — Patient Instructions (Addendum)
Stop Jardiance Decrease furosemide to '20mg'$  daily Keep eye on weight and swelling or shortness of breath Please call me if you gain 3lb overnight or 5lb in a week  Please send me blood pressure readings in 2 weeks Continue metoprolol succinate 37.'5mg'$  daily  You can call me at [phone]

## 2022-05-06 ENCOUNTER — Telehealth: Payer: Self-pay | Admitting: Pharmacist

## 2022-05-06 NOTE — Telephone Encounter (Signed)
Called pt and provided her with the email address of a private practice nutritionist.

## 2022-05-23 ENCOUNTER — Telehealth: Payer: Self-pay | Admitting: Pharmacist

## 2022-05-23 MED ORDER — LOSARTAN POTASSIUM 25 MG PO TABS: 12.5000 mg | ORAL_TABLET | Freq: Every day | ORAL | 3 refills | Status: DC

## 2022-05-23 NOTE — Telephone Encounter (Signed)
Called patient to follow up on her blood pressures since stopping Jardiance and decreasing furosemide.  113/73 99/71 104/71  112/69 118/71, 124/72, 134/ 119/84, 112/71  126lb highest 124lb lowest  Patients blood pressures are improved and weight is stable. Will add losartan 12.'5mg'$  daily and follow up on 8/17 in office.

## 2022-06-01 NOTE — Progress Notes (Signed)
Patient ID: [name]                 DOB: Feb 25, 1951                      MRN: [id number]     HPI: [name] is a 71 y.o. female referred by Dr. Ali Lowe to pharmacy clinic for HF medication management. PMH is significant for radiofrequency ablation for complex left atrial arrhythmia, CHF, HLD, CAC score of 0. Most recent LVEF 35-40% on 04/13/22.  At visit with MD on 6/30 patient was started on Jardiance '10mg'$  daily, losartan '25mg'$  daily and spironolactone '25mg'$  daily. At visit with PharmD on 05/05/22 pt reported home blood pressures were on the lower side (69/50, 72/49, 89/56). Patient reported not starting losartan or spironolactone. Pt was concerned about dehydration or malnutrition because her weight was way down and around 4:00 PM the skin on her arms starts to roll and sag. At this time her weight was down 17lb from prior to hospitalization for CHF. She does report some erratic SOB,. She is able to go to the gym and ride bike, walk on treadmill and lift weights with a trainer without SOB, but sometimes gets SOB carrying groceries or lying down. BP cuff will sometimes tell her she has an irregular heart rhythm. Patient was concerned about her family hx of sudden cardiac death and wanting referral to nutritionist.  Pt discussed concerns with starting Jardiance due to history of UTI. Jardiance was discontinued, decreased furosemide from 40 mg daily to 20 mg daily due to concerns with dehydration and decreased potassium from 20 mEq to 10 mEq daily. Pt was instructed to share BP readings in 2 weeks with plans to add losartan 12.5 mg daily if BP sufficient. Bps at telephone follow up were 113/73, 99/71, 104/71, 112/69, 118/71, 124/72, 119/84, and 112/71 with highest weight 126lb and lowest 124lb. With Bps improved and weight stable, pt was started on losartan 12.5 mg daily.   Pt presents today for follow up with PharmD. She shares that since starting losartan on August 7th she has been feeling dizzy  in the morning and slightly off balance but this gets better throughout the day. The dizziness does not occur when changing positions but is mainly when she is vertical and standing for longer periods. She shares that she is very careful about slowly getting out of bed and getting up from seated positions. She shares again that at about 4 pm her arm muscles feel atrophied and skin appears to be sagging primarily in the right arm, which doesn't feel uncomfortable but is visually concerning for the patient. Her grip strength feels normal, and she is still working with a Physiological scientist at the gym two times a week. She will be following up with her dermatologist about this issue. She takes her lasix and potassium in the morning, metoprolol and losartan at night. She has been feeling better with the lower dose of Lasix but still experiences some phlegm and is not sure if related. She also reports some sensations of pressure in her heart that lasted a few days. Average BP at home 122/74, with second readings obtained after resting a few minutes always lower. Low reading of 86/58 obtained after exercising outside in the heat. She has been adding more protein to her diet, such as adding salmon to her morning eggs for breakfast, drinking chocolate milk instead of water and adding nut butters to her diet. She is  still concerned that she has not gained back the weight she has lost since her hospitalization.    Current CHF meds: losartan 12.5 mg daily, metoprolol succinate 37.'5mg'$  daily, furosemide '20mg'$  daily, KCL 10 MEQ daily BP goal: <130/80  Labs:  BMET 04/22/22 - Na 134, K 4.4, Scr 0.98   Family History:  Family History  Problem Relation Age of Onset   Heart attack Mother 40   Pancreatic cancer Mother    Heart attack Father 36   Heart attack Sister 35   Heart disease Sister    Hernia Paternal Aunt    Colon cancer Paternal Uncle 8   Heart attack Maternal Grandmother    Heart attack Paternal Grandmother     Diabetes Nephew    Other Son        Avascular acrosis   Clotting disorder Son    Rectal cancer Neg Hx    Stomach cancer Neg Hx    Esophageal cancer Neg Hx      Social History:  Social History   Socioeconomic History   Marital status: Widowed    Spouse name: Not on file   Number of children: 1   Years of education: Not on file   Highest education level: Not on file  Occupational History   Occupation: Retired  Tobacco Use   Smoking status: Never   Smokeless tobacco: Never  Vaping Use   Vaping Use: Never used  Substance and Sexual Activity   Alcohol use: Yes    Alcohol/week: 1.0 standard drink of alcohol    Types: 1 Glasses of wine per week    Comment: socially   Drug use: No   Sexual activity: Not on file  Other Topics Concern   Not on file  Social History Narrative   Not on file   Social Determinants of Health   Financial Resource Strain: Not on file  Food Insecurity: Not on file  Transportation Needs: Not on file  Physical Activity: Not on file  Stress: Not on file  Social Connections: Not on file  Intimate Partner Violence: Not on file   Diet:  Adding salmon to eggs for more protein in am Nut butters Chocolate milk  Exercise: bikes 3 miles, working out with trainer in gym 2 days a week  Home BP readings:  (From 05/17/22-06/02/22) 114/74, 116/72, 117/74, 72/53, 86/58, 102/69, 125/75, 125/74, 118/73; HR mid 60s  Wt Readings from Last 3 Encounters:  05/05/22 126 lb (57.2 kg)  04/15/22 125 lb 12.8 oz (57.1 kg)  04/07/22 124 lb 3.2 oz (56.3 kg)   BP Readings from Last 3 Encounters:  05/05/22 130/80  04/15/22 100/60  04/07/22 120/73   Pulse Readings from Last 3 Encounters:  05/05/22 71  04/15/22 72  04/07/22 71    Renal function: CrCl cannot be calculated (Patient's most recent lab result is older than the maximum 21 days allowed.).  Past Medical History:  Diagnosis Date   Adenomatous polyp    Allergic rhinitis    Anxiety    AV Nodal Reentry  Tachycardia    s/p RFCA GT 2/15   AVNRT (AV nodal re-entry tachycardia) ([facility]) 11/06/2013   Calcified granuloma of lung (HCC)    Cataract    CHF (congestive heart failure) ([place])    Diverticulosis 01/2020   Gastritis    GERD (gastroesophageal reflux disease)    H/O radiofrequency ablation for complex left atrial arrhythmia 12/12/2013   Headache    Hematuria    Hemorrhoids    HTN (  hypertension)    Hyperlipemia    Hypothyroidism    Insomnia    Iron deficiency    Irregular heart beat    Laryngopharyngeal reflux (LPR)    Migraine    Mitral valve prolapse    ?   Rectal leakage    1 time   Retinal tear of both eyes    Skin lesion of breast    Vitamin D deficiency    Vulvodynia     Current Outpatient Medications on File Prior to Visit  Medication Sig Dispense Refill   ALPRAZolam (XANAX) 0.25 MG tablet Take 0.25 mg by mouth 2 (two) times daily as needed for anxiety or sleep.     ciclopirox (LOPROX) 0.77 % cream Apply 1 application. topically 2 (two) times daily as needed (foot fungus).     clotrimazole (LOTRIMIN) 1 % cream as needed.     FLUoxetine (PROZAC) 40 MG capsule Take 40 mg by mouth every morning.     fluticasone (FLONASE) 50 MCG/ACT nasal spray Place 1 spray into both nostrils at bedtime as needed for allergies or rhinitis.     furosemide (LASIX) 40 MG tablet Take 0.5 tablets (20 mg total) by mouth daily. 45 tablet 3   levothyroxine (SYNTHROID) 25 MCG tablet Take 37.5 mcg by mouth daily before breakfast.     losartan (COZAAR) 25 MG tablet Take 0.5 tablets (12.5 mg total) by mouth daily. 45 tablet 3   melatonin 3 MG TABS tablet Take 6 mg by mouth at bedtime as needed (sleep).     metoprolol succinate (TOPROL XL) 25 MG 24 hr tablet Take 1.5 tablets (37.5 mg total) by mouth at bedtime. 135 tablet 3   Multiple Vitamin (MULTIVITAMIN) tablet Take 1 tablet by mouth daily.     Polyethyl Glycol-Propyl Glycol (SYSTANE) 0.4-0.3 % SOLN Place 1 drop into both eyes daily as needed (dry  eyes).     potassium chloride (KLOR-CON) 10 MEQ tablet Take 1 tablet (10 mEq total) by mouth daily. 180 tablet 3   No current facility-administered medications on file prior to visit.    Allergies  Allergen Reactions   Crestor [Rosuvastatin Calcium] Other (See Comments)    Muscle aches   Pravastatin Other (See Comments)    Muscle aches    Propranolol Other (See Comments)    Caused severe weakness     Assessment/Plan:   1. CHF -  BP today of 98/60 is low but tolerable. Though blood pressures are slightly higher at home, will not adjust medications today given pt dizziness. Instructed patient to continue taking continue taking losartan 12.5 mg daily, metoprolol succinate 37.5 mg daily, furosemide 20 mg daily, and KCL 10 mEq daily. Informed pt to call us if systolic BP drops to 09W and/or diastolic to 11B. Obtaining basic metabolic panel today to evaluate renal function since losartan was started. Instructed pt to continue monitoring BP at home. Informed pt to continue adding protein to her diet. She is working with a nutritionist. Reassured patient that we are not concerned about her weight given that it has been stable. She will follow up with Nira Conn (RD) for more specific nutrition recommendations.   Eliseo Gum, PharmD PGY1 Pharmacy Resident   06/02/2022  9:16 AM   Ramond Dial, Pharm.D, BCPS, CPP [facility]  [address], [place], [place] [address]  Phone: [phone]; Fax: [phone]

## 2022-06-02 ENCOUNTER — Ambulatory Visit (INDEPENDENT_AMBULATORY_CARE_PROVIDER_SITE_OTHER): Payer: Medicare Other | Admitting: Pharmacist

## 2022-06-02 VITALS — BP 98/60 | HR 64 | Wt 128.2 lb

## 2022-06-02 DIAGNOSIS — I5022 Chronic systolic (congestive) heart failure: Secondary | ICD-10-CM | POA: Diagnosis not present

## 2022-06-02 LAB — BASIC METABOLIC PANEL
BUN/Creatinine Ratio: 22 (ref 12–28)
BUN: 19 mg/dL (ref 8–27)
CO2: 25 mmol/L (ref 20–29)
Calcium: 9.1 mg/dL (ref 8.7–10.3)
Chloride: 101 mmol/L (ref 96–106)
Creatinine, Ser: 0.85 mg/dL (ref 0.57–1.00)
Glucose: 67 mg/dL — ABNORMAL LOW (ref 70–99)
Potassium: 4.5 mmol/L (ref 3.5–5.2)
Sodium: 140 mmol/L (ref 134–144)
eGFR: 74 mL/min/{1.73_m2} (ref >59)

## 2022-06-02 NOTE — Patient Instructions (Addendum)
Continue taking losartan 12.5 mg daily, metoprolol succinate 37.5 mg daily, furosemide 20 mg daily, KCL 10 mEq daily   We will be checking a basic metabolic panel today  Continue to check blood pressure at home. Call us at [phone] if systolic blood perssure is in 76L, and if diastolic pressure is in 46T or if dizziness gets worse   Continue adding protein to your diet

## 2022-06-10 ENCOUNTER — Ambulatory Visit: Payer: Medicare Other | Admitting: Internal Medicine

## 2022-06-13 DIAGNOSIS — B353 Tinea pedis: Secondary | ICD-10-CM | POA: Diagnosis not present

## 2022-06-13 DIAGNOSIS — D1801 Hemangioma of skin and subcutaneous tissue: Secondary | ICD-10-CM | POA: Diagnosis not present

## 2022-06-13 DIAGNOSIS — D1721 Benign lipomatous neoplasm of skin and subcutaneous tissue of right arm: Secondary | ICD-10-CM | POA: Diagnosis not present

## 2022-06-13 DIAGNOSIS — I788 Other diseases of capillaries: Secondary | ICD-10-CM | POA: Diagnosis not present

## 2022-06-13 DIAGNOSIS — L821 Other seborrheic keratosis: Secondary | ICD-10-CM | POA: Diagnosis not present

## 2022-06-13 DIAGNOSIS — D225 Melanocytic nevi of trunk: Secondary | ICD-10-CM | POA: Diagnosis not present

## 2022-06-13 DIAGNOSIS — L814 Other melanin hyperpigmentation: Secondary | ICD-10-CM | POA: Diagnosis not present

## 2022-06-13 DIAGNOSIS — D2262 Melanocytic nevi of left upper limb, including shoulder: Secondary | ICD-10-CM | POA: Diagnosis not present

## 2022-06-14 DIAGNOSIS — E039 Hypothyroidism, unspecified: Secondary | ICD-10-CM | POA: Diagnosis not present

## 2022-06-23 DIAGNOSIS — R350 Frequency of micturition: Secondary | ICD-10-CM | POA: Diagnosis not present

## 2022-06-23 DIAGNOSIS — I5022 Chronic systolic (congestive) heart failure: Secondary | ICD-10-CM | POA: Diagnosis not present

## 2022-06-23 DIAGNOSIS — I429 Cardiomyopathy, unspecified: Secondary | ICD-10-CM | POA: Diagnosis not present

## 2022-06-23 DIAGNOSIS — I10 Essential (primary) hypertension: Secondary | ICD-10-CM | POA: Diagnosis not present

## 2022-06-24 DIAGNOSIS — R350 Frequency of micturition: Secondary | ICD-10-CM | POA: Diagnosis not present

## 2022-06-28 ENCOUNTER — Telehealth (HOSPITAL_COMMUNITY): Payer: Self-pay | Admitting: Emergency Medicine

## 2022-06-28 NOTE — Telephone Encounter (Signed)
Reaching out to patient to offer assistance regarding upcoming cardiac imaging study; pt verbalizes understanding of appt date/time, parking situation and where to check in, pre-test NPO status and medications ordered, and verified current allergies; name and call back number provided for further questions should they arise Marchia Bond RN Navigator Cardiac Imaging [facility] [phone] office [phone] cell  Arrival 330 w/c entrnace Holding lasix Denies metal Denies clasutro Denies iv issues

## 2022-06-29 ENCOUNTER — Ambulatory Visit (HOSPITAL_COMMUNITY)
Admission: RE | Admit: 2022-06-29 | Discharge: 2022-06-29 | Disposition: A | Discharge status: Home / Self Care | Payer: Medicare Other | Source: Ambulatory Visit | Attending: Internal Medicine | Admitting: Internal Medicine

## 2022-06-29 ENCOUNTER — Other Ambulatory Visit: Payer: Self-pay | Admitting: Internal Medicine

## 2022-06-29 DIAGNOSIS — I5022 Chronic systolic (congestive) heart failure: Secondary | ICD-10-CM | POA: Diagnosis not present

## 2022-06-29 DIAGNOSIS — Z9889 Other specified postprocedural states: Secondary | ICD-10-CM | POA: Diagnosis not present

## 2022-06-29 DIAGNOSIS — Z8679 Personal history of other diseases of the circulatory system: Secondary | ICD-10-CM

## 2022-06-29 DIAGNOSIS — I428 Other cardiomyopathies: Secondary | ICD-10-CM | POA: Diagnosis not present

## 2022-06-29 DIAGNOSIS — E785 Hyperlipidemia, unspecified: Secondary | ICD-10-CM

## 2022-06-29 MED ORDER — GADOBUTROL 1 MMOL/ML IV SOLN
9.0000 mL | Freq: Once | INTRAVENOUS | Status: AC | PRN
  Administered 2022-06-29: 9 mL via INTRAVENOUS

## 2022-07-11 ENCOUNTER — Ambulatory Visit (HOSPITAL_COMMUNITY): Payer: Medicare Other | Attending: Internal Medicine

## 2022-07-11 ENCOUNTER — Encounter: Payer: Self-pay | Admitting: Pharmacist

## 2022-07-11 DIAGNOSIS — E785 Hyperlipidemia, unspecified: Secondary | ICD-10-CM

## 2022-07-11 DIAGNOSIS — Z8679 Personal history of other diseases of the circulatory system: Secondary | ICD-10-CM | POA: Diagnosis not present

## 2022-07-11 DIAGNOSIS — Z9889 Other specified postprocedural states: Secondary | ICD-10-CM

## 2022-07-11 DIAGNOSIS — I428 Other cardiomyopathies: Secondary | ICD-10-CM

## 2022-07-11 DIAGNOSIS — I5022 Chronic systolic (congestive) heart failure: Secondary | ICD-10-CM

## 2022-07-11 LAB — ECHOCARDIOGRAM COMPLETE
Area-P 1/2: 3.6 cm2
S' Lateral: 4.2 cm

## 2022-07-13 DIAGNOSIS — R768 Other specified abnormal immunological findings in serum: Secondary | ICD-10-CM | POA: Diagnosis not present

## 2022-07-13 DIAGNOSIS — I5022 Chronic systolic (congestive) heart failure: Secondary | ICD-10-CM | POA: Diagnosis not present

## 2022-07-13 DIAGNOSIS — R5383 Other fatigue: Secondary | ICD-10-CM | POA: Diagnosis not present

## 2022-07-13 DIAGNOSIS — R682 Dry mouth, unspecified: Secondary | ICD-10-CM | POA: Diagnosis not present

## 2022-07-13 DIAGNOSIS — D649 Anemia, unspecified: Secondary | ICD-10-CM | POA: Diagnosis not present

## 2022-07-13 DIAGNOSIS — R002 Palpitations: Secondary | ICD-10-CM | POA: Diagnosis not present

## 2022-07-13 DIAGNOSIS — R700 Elevated erythrocyte sedimentation rate: Secondary | ICD-10-CM | POA: Diagnosis not present

## 2022-07-14 MED ORDER — SPIRONOLACTONE 25 MG PO TABS: 12.5000 mg | ORAL_TABLET | Freq: Every day | ORAL | 11 refills | Status: DC

## 2022-07-18 NOTE — Progress Notes (Signed)
Cardiology Office Note:    Date:  07/19/2022   ID:  [name], DOB 06-29-1951, MRN [id number]  PCP:  [name], [facility] Cardiologist:  [name], MD Referring MD: [name], FNP   Chief Complaint/Reason for Referral: Heart failure  ASSESSMENT:    1. NICM (nonischemic cardiomyopathy) ([facility])   2. Chronic systolic heart failure ([place])   3. Hyperlipidemia, unspecified hyperlipidemia type   4. H/O radiofrequency ablation for complex left atrial arrhythmia   5. Palpitations        PLAN:    In order of problems listed above: 1.  NICM: Restart Jardiance '10mg'$  daily, continue Toprol-XL 37.'5mg'$  QPM, increase losartan to 25 mg QPM. and continue spironolactone 12.5 mg QPM.  Continue Lasix 20 mg daily and have asked the patient to perhaps go to every other day dosing depending on how she feels.  I am happy to see that her ejection fraction is above 35% on her MRI.  Follow-up in 6 months or earlier if needed.   2.  Chronic systolic heart failure: She looks euvolemic on exam today; adjust Lasix as detailed above.   3.  Hyperlipidemia: Her coronary artery calcium score was 0.  She lacks aortic atherosclerosis or coronary artery disease so this may be able to be managed by diet alone.  Discontinue atorvastatin. 4.  AVNRT ablation: Continue Toprol for now.  She is having some palpitations so we will obtain a monitor.  Dispo:  Return in about 6 months (around 01/18/2023).      Medication Adjustments/Labs and Tests Ordered: Current medicines are reviewed at length with the patient today.  Concerns regarding medicines are outlined above.  The following changes have been made:     Labs/tests ordered: Orders Placed This Encounter  Procedures   LONG TERM MONITOR (3-14 DAYS)    Medication Changes: Meds ordered this encounter  Medications   empagliflozin (JARDIANCE) 10 MG TABS tablet    Sig: Take 1 tablet (10 mg total) by mouth daily before  breakfast.    Dispense:  90 tablet    Refill:  3     Current medicines are reviewed at length with the patient today.  The patient does not have concerns regarding medicines.   History of Present Illness:    FOCUSED PROBLEM LIST:   1.  AVNRT status post catheter ablation 2015 2.  Hyperlipidemia 3.  Family history of coronary artery disease 4.  Coronary artery calcium score 2020 of 0 5.  Cardiomyopathy with ejection fraction of 35 to 40%; coronary CTA 2023 with nonobstructive coronary artery disease  May 2023 Initial consultation:  The patient is a 71 y.o. female with the indicated medical history here to establish cardiovascular care.  The patient had been cared for Dr. [name] for some time.  She had a coronary artery calcium score of 0 in 2020 and an echocardiogram last month demonstrated an ejection fraction of 45 to 50% with no significant valvular abnormalities with global hypokinesis.  She was admitted recently with acute on chronic systolic and diastolic heart failure.  She received a dose of diuretic and was discharged home.  She is here to after hospital admission.  Her troponins were negative however her BNP was elevated.  Her chest x-ray demonstrated pulmonary edema.    She tells me that she has been short of breath and fatigued for some time.  She also feels palpitations at night.  Her weight has been going down however and  she is on a fluid restriction.  She has noted some peripheral edema.  She does not note any orthopnea or paroxysmal nocturnal dyspnea.  She occasionally gets lightheaded at times.  She has had no frank syncope.  She denies any exertional angina.  Patient diltiazem was discontinued due to the mild cardiomyopathy she had developed.  Additionally Toprol XL 25 mg at bedtime and Lasix 20 mg twice daily was initiated.  Patient was referred for coronary CTA.    May 2023: In the interim the patient had a coronary CTA which was negative with a score of 0.  It did show  bilateral pleural effusions.  The patient's event monitor demonstrated 11 runs of supraventricular ventricular tachycardia as well as 5 runs of nonsustained ventricular tachycardia.  Her Toprol-XL was increased to 37.5 mg.  A proBNP was ordered and found to be over 7500.  The increased dose of the Lasix to 40 mg twice daily has helped over the last day.  The patient feels less short of breath and overall better.  She does still complain of orthopnea however she has had no significant peripheral edema.  Her fatigue level seems to be better as well as her shortness of breath.  She was referred for right heart catheterization which demonstrated elevated V waves but preserved cardiac output and index.  She was also referred for an echocardiogram which demonstrated worsening LV function with ejection fraction of 35 to 40% with global hypokinesis and no significant valvular abnormalities.  June 2023: The patient is seen to discuss test results.  She has been feeling pretty well.  She denies any shortness of breath.  She has been exercising moderately.  She occasionally feels palpitations at night.  She denies any exertional angina.  She denies any peripheral edema.  She has had no presyncope or syncope.  Plan: Start Jardiance 10 mg daily, start losartan 25 mg daily, and spironolactone 25 mg daily.  Refer to pharmacy for heart failure med titration.  Today: The interim she was managed by the pharmacy division.  She had low blood pressures and dizziness and for this reason her medication regimen was adjusted to losartan 12.5 mg daily, metoprolol 37.5 mg daily, and Lasix 20 mg daily.  A cardiac MRI demonstrated no gadolinium enhancement or infiltrative cardiomyopathy.  The cardiac MRI and an recent echocardiogram both showed an ejection fraction of around 40%.  Today the patient feels very well.  Her breathing is much improved.  She has had no presyncope or syncope.  She denies any peripheral edema.  At nighttime when  she is laying on her side she will hear extra heartbeats which have made her concerned.  She denies any need for hospitalizations or emergency room visits.  Overall she is satisfied with improvement in how she feels.  Current Medications: Current Meds  Medication Sig   ALPRAZolam (XANAX) 0.25 MG tablet Take 0.25 mg by mouth 2 (two) times daily as needed for anxiety or sleep.   ciclopirox (LOPROX) 0.77 % cream Apply 1 application. topically 2 (two) times daily as needed (foot fungus).   clotrimazole (LOTRIMIN) 1 % cream as needed.   empagliflozin (JARDIANCE) 10 MG TABS tablet Take 1 tablet (10 mg total) by mouth daily before breakfast.   FLUoxetine (PROZAC) 40 MG capsule Take 40 mg by mouth every morning.   fluticasone (FLONASE) 50 MCG/ACT nasal spray Place 1 spray into both nostrils at bedtime as needed for allergies or rhinitis.   furosemide (LASIX) 40 MG tablet Take  0.5 tablets (20 mg total) by mouth daily.   losartan (COZAAR) 25 MG tablet Take 12.5 mg by mouth daily.   melatonin 3 MG TABS tablet Take 6 mg by mouth at bedtime as needed (sleep).   metoprolol succinate (TOPROL XL) 25 MG 24 hr tablet Take 1.5 tablets (37.5 mg total) by mouth at bedtime.   Multiple Vitamin (MULTIVITAMIN) tablet Take 1 tablet by mouth daily.   Polyethyl Glycol-Propyl Glycol (SYSTANE) 0.4-0.3 % SOLN Place 1 drop into both eyes daily as needed (dry eyes).   potassium chloride (KLOR-CON) 10 MEQ tablet Take 1 tablet (10 mEq total) by mouth daily.   spironolactone (ALDACTONE) 25 MG tablet Take 0.5 tablets (12.5 mg total) by mouth daily.   TIROSINT 37.5 MCG CAPS Take 1 capsule by mouth every morning.   [DISCONTINUED] losartan (COZAAR) 25 MG tablet Take 0.5 tablets (12.5 mg total) by mouth daily.     Allergies:    Crestor [rosuvastatin calcium], Pravastatin, and Propranolol   Social History:   Social History   Tobacco Use   Smoking status: Never   Smokeless tobacco: Never  Vaping Use   Vaping Use: Never used   Substance Use Topics   Alcohol use: Yes    Alcohol/week: 1.0 standard drink of alcohol    Types: 1 Glasses of wine per week    Comment: socially   Drug use: No     Family Hx: Family History  Problem Relation Age of Onset   Heart attack Mother 41   Pancreatic cancer Mother    Heart attack Father 60   Heart attack Sister 51   Heart disease Sister    Hernia Paternal Aunt    Colon cancer Paternal Uncle 39   Heart attack Maternal Grandmother    Heart attack Paternal Grandmother    Diabetes Nephew    Other Son        Avascular acrosis   Clotting disorder Son    Rectal cancer Neg Hx    Stomach cancer Neg Hx    Esophageal cancer Neg Hx      Review of Systems:   Please see the history of present illness.    All other systems reviewed and are negative.     EKGs/Labs/Other Test Reviewed:    EKG:  EKG performed April 2023 that I personally reviewed demonstrates sinus rhythm with lateral infarction pattern.  EKG today demonstrates sinus rhythm with first-degree AV blockd  Prior CV studies:  Cardiac MR: LVEF 42%. Small pericardial effusion. No evidence of infiltrative cardiomyopathy.  TTE September 2023: 1. Left ventricular ejection fraction, by estimation, is 35 to 40%. Left  ventricular ejection fraction by 3D volume is 38 %. The left ventricle has  moderately decreased function. The left ventricle demonstrates global  hypokinesis. There is mild  asymmetric left ventricular hypertrophy of the septal segment. Left  ventricular diastolic parameters are consistent with Grade I diastolic  dysfunction (impaired relaxation).   2. Right ventricular systolic function is normal. The right ventricular  size is normal. There is normal pulmonary artery systolic pressure.   3. A small pericardial effusion is present. The pericardial effusion is  circumferential.   4. The mitral valve is grossly normal. Mild to moderate mitral valve  regurgitation. The mean mitral valve gradient is  1.4 mmHg with average  heart rate of 63 bpm.   5. The aortic valve is tricuspid. Aortic valve regurgitation is not  visualized. Aortic valve sclerosis is present, with no evidence of aortic  valve  stenosis.   6. The inferior vena cava is normal in size with greater than 50%  respiratory variability, suggesting right atrial pressure of 3 mmHg.   TTE June 2023 with ejection fraction of 35 to 40% with global hypokinesis, grade 3 diastolic dysfunction with restrictive filling, moderately dilated left atrium and mild mitral regurgitation  Right heart catheterization 2023 with a cardiac output of 4.1 L/min and index of 2.6 L/min/m with V waves to 38 mmHg with a mean wedge pressure 24 mmHg, mean RA pressure of 1 mmHg, mean PA pressure of 20 mmHg, and normal PA pulsatility index  Monitor 2023 15 episodes of nonsustained ventricular tachycardia and 11 episodes of supraventricular tachycardia with the longest lasting 14 seconds  Coronary CTA 2023 calcium score of 0 without obstructive coronary artery disease TTE 2023 with ejection fraction of 45 to 50% with mild global hypokinesis and no significant valvular abnormalities  Monitor 2023 demonstrates sinus rhythm with occasional first-degree AV block and 20 ventricular tachycardic episodes lasting 10 beats and 36 atrial tachycardic episodes lasting 12.5 seconds  ETT 2017 low risk with DTS of 9  Other studies Reviewed: Review of the additional studies/records demonstrates: CT abdomen pelvis 2021 without aortic atherosclerosis  Recent Labs: 01/25/2022: ALT 34; TSH 6.406 01/28/2022: B Natriuretic Peptide 1,619.7 01/29/2022: Magnesium 1.7 03/25/2022: NT-Pro BNP 6,816 04/05/2022: Platelets 275 04/22/2022: Hemoglobin 11.5 06/02/2022: BUN 19; Creatinine, Ser 0.85; Potassium 4.5; Sodium 140   Recent Lipid Panel Lab Results  Component Value Date/Time   CHOL 290 (H) 01/18/2012 09:10 AM   TRIG 127.0 01/18/2012 09:10 AM   HDL 54.40 01/18/2012 09:10 AM    LDLDIRECT 209.4 01/18/2012 09:10 AM   Recent labs from June demonstrated a hemoglobin of 10.6, platelets 219, creatinine of 0.83, sodium 137, potassium 4.5, LFTs within normal limits.  Risk Assessment/Calculations:           Physical Exam:    VS:  BP 100/60   Pulse 70   Ht '5\' 3"'$  (1.6 m)   Wt 131 lb 3.2 oz (59.5 kg)   SpO2 99%   BMI 23.24 kg/m    Wt Readings from Last 3 Encounters:  07/19/22 131 lb 3.2 oz (59.5 kg)  06/02/22 128 lb 3.2 oz (58.2 kg)  05/05/22 126 lb (57.2 kg)    GENERAL:  No apparent distress, AOx3 HEENT:  No carotid bruits, +2 carotid impulses, no scleral icterus; +JVD CAR: RRR no murmurs, gallops, rubs, or thrills RES:  Clear to auscultation bilaterally ABD:  Soft, nontender, nondistended, positive bowel sounds x 4 VASC:  +2 radial pulses, +2 carotid pulses, palpable pedal pulses NEURO:  CN 2-12 grossly intact; motor and sensory grossly intact PSYCH:  No active depression or anxiety EXT:  No edema, ecchymosis, or cyanosis  Signed, Early Osmond, MD  07/19/2022 3:53 PM    [address], [place], [place]  [address] Phone: [phone]; Fax: [phone]   Note:  This document was prepared using Dragon voice recognition software and may include unintentional dictation errors.

## 2022-07-19 ENCOUNTER — Ambulatory Visit: Payer: Medicare Other | Attending: Internal Medicine | Admitting: Internal Medicine

## 2022-07-19 ENCOUNTER — Encounter: Payer: Self-pay | Admitting: Internal Medicine

## 2022-07-19 ENCOUNTER — Ambulatory Visit: Payer: Medicare Other

## 2022-07-19 ENCOUNTER — Ambulatory Visit (INDEPENDENT_AMBULATORY_CARE_PROVIDER_SITE_OTHER): Payer: Medicare Other

## 2022-07-19 VITALS — BP 100/60 | HR 70 | Ht 63.0 in | Wt 131.2 lb

## 2022-07-19 DIAGNOSIS — Z8679 Personal history of other diseases of the circulatory system: Secondary | ICD-10-CM

## 2022-07-19 DIAGNOSIS — R002 Palpitations: Secondary | ICD-10-CM | POA: Insufficient documentation

## 2022-07-19 DIAGNOSIS — E785 Hyperlipidemia, unspecified: Secondary | ICD-10-CM | POA: Insufficient documentation

## 2022-07-19 DIAGNOSIS — I5022 Chronic systolic (congestive) heart failure: Secondary | ICD-10-CM | POA: Insufficient documentation

## 2022-07-19 DIAGNOSIS — Z9889 Other specified postprocedural states: Secondary | ICD-10-CM | POA: Diagnosis not present

## 2022-07-19 DIAGNOSIS — I428 Other cardiomyopathies: Secondary | ICD-10-CM | POA: Diagnosis not present

## 2022-07-19 MED ORDER — EMPAGLIFLOZIN 10 MG PO TABS: 10.0000 mg | ORAL_TABLET | Freq: Every day | ORAL | 3 refills | Status: DC

## 2022-07-19 NOTE — Progress Notes (Unsigned)
Enrolled for [employer] to mail a ZIO XT long term holter monitor to the patients address on file.  

## 2022-07-19 NOTE — Patient Instructions (Addendum)
Medication Instructions:  Start Jardiance 10 mg daily   *If you need a refill on your cardiac medications before your next appointment, please call your pharmacy*   Lab Work: Bmp - today as scheduled.  If you have labs (blood work) drawn today and your tests are completely normal, you will receive your results only by: [place] (if you have MyChart) OR A paper copy in the mail If you have any lab test that is abnormal or we need to change your treatment, we will call you to review the results.   Testing/Procedures: A zio monitor was ordered today. It will remain on for 3 days. You will then return monitor and event diary in provided box. It takes 1-2 weeks for report to be downloaded and returned to [name]. We will call you with the results. If monitor falls off or has orange flashing light, please call [employer] for further instructions.     Follow-Up: At [facility], you and your health needs are our priority.  As part of our continuing mission to provide you with exceptional heart care, we have created designated Provider Care Teams.  These Care Teams include your primary Cardiologist (physician) and Advanced Practice Providers (APPs -  Physician Assistants and Nurse Practitioners) who all work together to provide you with the care you need, when you need it.   Your next appointment:   6 month(s)  The format for your next appointment:   In Person  Provider:   Robbie Lis, PA-C, Nicholes Rough, PA-C, Melina Copa, PA-C, Ambrose Pancoast, NP, Cecilie Kicks, NP, Ermalinda Barrios, PA-C, Christen Bame, NP, or Richardson Dopp, PA-C    Other Instructions [place]- Long Term Monitor Instructions  Your physician has requested you wear a ZIO patch monitor for 3 days.  This is a single patch monitor. [employer] supplies one patch monitor per enrollment. Additional stickers are not available. Please do not apply patch if you will be having a Nuclear Stress Test,  Echocardiogram, Cardiac CT, MRI, or  Chest Xray during the period you would be wearing the  monitor. The patch cannot be worn during these tests. You cannot remove and re-apply the  ZIO XT patch monitor.  Your ZIO patch monitor will be mailed 3 day USPS to your address on file. It may take 3-5 days  to receive your monitor after you have been enrolled.  Once you have received your monitor, please review the enclosed instructions. Your monitor  has already been registered assigning a specific monitor serial # to you.  Billing and Patient Assistance Program Information  We have supplied [employer] with any of your insurance information on file for billing purposes. [employer] offers a sliding scale Patient Assistance Program for patients that do not have  insurance, or whose insurance does not completely cover the cost of the ZIO monitor.  You must apply for the Patient Assistance Program to qualify for this discounted rate.  To apply, please call [employer] at [phone], select option 4, select option 2, ask to apply for  Patient Assistance Program. Theodore Demark will ask your household income, and how many people  are in your household. They will quote your out-of-pocket cost based on that information.  [employer] will also be able to set up a 34-month interest-free payment plan if needed.  Applying the monitor   Shave hair from upper left chest.  Hold abrader disc by orange tab. Rub abrader in 40 strokes over the upper left chest as  indicated in your monitor  instructions.  Clean area with 4 enclosed alcohol pads. Let dry.  Apply patch as indicated in monitor instructions. Patch will be placed under collarbone on left  side of chest with arrow pointing upward.  Rub patch adhesive wings for 2 minutes. Remove white label marked "1". Remove the white  label marked "2". Rub patch adhesive wings for 2 additional minutes.  While looking in a mirror, press and release button in center of patch. A small green light will  flash 3-4 times. This  will be your only indicator that the monitor has been turned on.  Do not shower for the first 24 hours. You may shower after the first 24 hours.  Press the button if you feel a symptom. You will hear a small click. Record Date, Time and  Symptom in the Patient Logbook.  When you are ready to remove the patch, follow instructions on the last 2 pages of Patient  Logbook. Stick patch monitor onto the last page of Patient Logbook.  Place Patient Logbook in the blue and white box. Use locking tab on box and tape box closed  securely. The blue and white box has prepaid postage on it. Please place it in the mailbox as  soon as possible. Your physician should have your test results approximately 7 days after the  monitor has been mailed back to [facility].  Call [place] at [phone] if you have questions regarding  your ZIO XT patch monitor. Call them immediately if you see an orange light blinking on your  monitor.  If your monitor falls off in less than 4 days, contact our Monitor department at [phone].  If your monitor becomes loose or falls off after 4 days call [employer] at [phone] for  suggestions on securing your monitor

## 2022-07-20 LAB — BASIC METABOLIC PANEL
BUN/Creatinine Ratio: 29 — ABNORMAL HIGH (ref 12–28)
BUN: 25 mg/dL (ref 8–27)
CO2: 23 mmol/L (ref 20–29)
Calcium: 9.1 mg/dL (ref 8.7–10.3)
Chloride: 99 mmol/L (ref 96–106)
Creatinine, Ser: 0.87 mg/dL (ref 0.57–1.00)
Glucose: 87 mg/dL (ref 70–99)
Potassium: 3.8 mmol/L (ref 3.5–5.2)
Sodium: 137 mmol/L (ref 134–144)
eGFR: 72 mL/min/{1.73_m2} (ref >59)

## 2022-07-25 ENCOUNTER — Encounter: Payer: Self-pay | Admitting: Pharmacist

## 2022-07-25 DIAGNOSIS — I5022 Chronic systolic (congestive) heart failure: Secondary | ICD-10-CM

## 2022-07-26 DIAGNOSIS — Z23 Encounter for immunization: Secondary | ICD-10-CM | POA: Diagnosis not present

## 2022-07-28 DIAGNOSIS — R002 Palpitations: Secondary | ICD-10-CM

## 2022-07-28 DIAGNOSIS — Z8679 Personal history of other diseases of the circulatory system: Secondary | ICD-10-CM

## 2022-07-28 DIAGNOSIS — Z9889 Other specified postprocedural states: Secondary | ICD-10-CM | POA: Diagnosis not present

## 2022-08-10 DIAGNOSIS — Z23 Encounter for immunization: Secondary | ICD-10-CM | POA: Diagnosis not present

## 2022-08-11 DIAGNOSIS — Z9889 Other specified postprocedural states: Secondary | ICD-10-CM | POA: Diagnosis not present

## 2022-08-11 DIAGNOSIS — R002 Palpitations: Secondary | ICD-10-CM | POA: Diagnosis not present

## 2022-08-16 DIAGNOSIS — H35371 Puckering of macula, right eye: Secondary | ICD-10-CM | POA: Diagnosis not present

## 2022-08-24 DIAGNOSIS — I5022 Chronic systolic (congestive) heart failure: Secondary | ICD-10-CM | POA: Diagnosis not present

## 2022-08-25 LAB — BASIC METABOLIC PANEL
BUN/Creatinine Ratio: 21 (ref 12–28)
BUN: 20 mg/dL (ref 8–27)
CO2: 23 mmol/L (ref 20–29)
Calcium: 9.4 mg/dL (ref 8.7–10.3)
Chloride: 103 mmol/L (ref 96–106)
Creatinine, Ser: 0.96 mg/dL (ref 0.57–1.00)
Glucose: 81 mg/dL (ref 70–99)
Potassium: 4.5 mmol/L (ref 3.5–5.2)
Sodium: 142 mmol/L (ref 134–144)
eGFR: 64 mL/min/{1.73_m2} (ref >59)

## 2022-09-06 ENCOUNTER — Ambulatory Visit: Payer: Medicare Other | Attending: Internal Medicine | Admitting: Pharmacist

## 2022-09-06 VITALS — BP 102/72 | Wt 138.6 lb

## 2022-09-06 DIAGNOSIS — I5022 Chronic systolic (congestive) heart failure: Secondary | ICD-10-CM | POA: Diagnosis not present

## 2022-09-06 NOTE — Patient Instructions (Addendum)
-  Monitor weight daily: if weight increases by 3 lbs overnight or 5lbs within a week--> take lasix medication and continue to monitor until back to dry weight -Continue to monitor for signs of dizziness -Increase protein intake  -Follow up if issues with medications -Continue to take current medications as prescribed -6 month follow up

## 2022-09-06 NOTE — Assessment & Plan Note (Signed)
Assessment: Has not been weighing herself daily and was surprised by her weight today; no signs of volume overload Only experiences dizziness when working out and moving positions quickly (bending over) Has not been monitoring blood pressure at home consistently, but is still in range. No other signs of low blood pressure besides dizziness Adherent to all her medications  Added more protein to diet; follows up with a nutritionist Has a trainer that helps her keep physical activity in her schedule Plan: Continue medications at current doses: with dizziness do not want to titrate medications at this time Start day with monitoring weight: if gain 3 lbs over night or 5 lbs within one week--> start taking lasix and monitor daily to see if need to continue lasix (back to dry weight) Continue to monitor for side effects of medications with losartan (inc dizziness) and jardiance (signs/symptoms of UTI) Continue to increase protein in diet and reach goal weight and working with Physiological scientist for physical activity  Follow up with primary care physician for further medication titrations

## 2022-09-06 NOTE — Progress Notes (Addendum)
Patient ID: [name]                 DOB: [birth year]                      MRN: [id number]      HPI: [name] is a 71 y.o. female referred by Dr. Ali Lowe to pharmacy clinic for HF medication management. PMH is significant for radiofrequency ablation for complex left atrial arrhythmia, CHF, HLD, CAC score of 0. Most recent LVEF 35-40% on 07/11/22.  At last PharmD visit in 06/02/2022, patient's blood pressure was low with reading of 98/60. Patient did report dizziness with losartan being started back in August. Dizziness occurred when she would stand for long periods of time, not when she changed positions. Medications were not further titrated due to dizziness.   Since last visit with PharmD, patient has been started on spironolactone 12.5 mg daily and discontinued her potassium supplement. She restarted jardiance 10 mg (stopped due to hx of recurrent UTIs). Follow up labs were WNL, Cr did bump up from 0.87 to 0.96. Patient has not needed to take her lasix for the last month with no signs of volume overload (ankle swelling, shortness of breath lying down). Patient was surprised by weight today at 138.6 lbs, but overall has been feeling very good.  Current CHF meds: losartan 12.5 mg daily, metoprolol succinate 37.18m daily, furosemide 285mas needed for swelling, spironolactone 12.5 mg daily, Jardiance 10 mg daily BP goal: <130/80   BMET    Component Value Date/Time   NA 142 08/24/2022 1049   K 4.5 08/24/2022 1049   CL 103 08/24/2022 1049   CO2 23 08/24/2022 1049   GLUCOSE 81 08/24/2022 1049   GLUCOSE 88 01/29/2022 1257   BUN 20 08/24/2022 1049   CREATININE 0.96 08/24/2022 1049   CALCIUM 9.4 08/24/2022 1049   EGFR 64 08/24/2022 1049   GFRNONAA >60 01/29/2022 1257   Family History:  Family History  Problem Relation Age of Onset   Heart attack Mother 508 Pancreatic cancer Mother    Heart attack Father 5363 Heart attack Sister 4870 Heart disease Sister    Hernia Paternal  Aunt    Colon cancer Paternal Uncle 703 Heart attack Maternal Grandmother    Heart attack Paternal Grandmother    Diabetes Nephew    Other Son        Avascular acrosis   Clotting disorder Son    Rectal cancer Neg Hx    Stomach cancer Neg Hx    Esophageal cancer Neg Hx     Social History:  reports that she has never smoked. She has never used smokeless tobacco. She reports current alcohol use of about 1.0 standard drink of alcohol per week. She reports that she does not use drugs.   Diet: working with a nutritionist; adding more protein into diet  Exercise: works with pePhysiological scientisto get physical activity in   Home BP readings: Patient did not bring in home readings, but states they were in usual range of SBP <110 with some above.  Office BP readings: 102/72; HR not recorded   Wt Readings from Last 3 Encounters:  09/06/22 138 lb 9.6 oz (62.9 kg)  07/19/22 131 lb 3.2 oz (59.5 kg)  06/02/22 128 lb 3.2 oz (58.2 kg)   BP Readings from Last 3 Encounters:  09/06/22 102/72  07/19/22 100/60  06/02/22 98/60   Pulse  Readings from Last 3 Encounters:  07/19/22 70  06/02/22 64  05/05/22 71    Renal function: Estimated Creatinine Clearance: 48.7 mL/min (by C-G formula based on SCr of 0.96 mg/dL).  Past Medical History:  Diagnosis Date   Adenomatous polyp    Allergic rhinitis    Anxiety    AV Nodal Reentry Tachycardia    s/p RFCA GT 2/15   AVNRT (AV nodal re-entry tachycardia) 11/06/2013   Calcified granuloma of lung (HCC)    Cataract    CHF (congestive heart failure) ([place])    Diverticulosis 01/2020   Gastritis    GERD (gastroesophageal reflux disease)    H/O radiofrequency ablation for complex left atrial arrhythmia 12/12/2013   Headache    Hematuria    Hemorrhoids    HTN (hypertension)    Hyperlipemia    Hypothyroidism    Insomnia    Iron deficiency    Irregular heart beat    Laryngopharyngeal reflux (LPR)    Migraine    Mitral valve prolapse    ?   Rectal  leakage    1 time   Retinal tear of both eyes    Skin lesion of breast    Vitamin D deficiency    Vulvodynia     Current Outpatient Medications on File Prior to Visit  Medication Sig Dispense Refill   ALPRAZolam (XANAX) 0.25 MG tablet Take 0.25 mg by mouth 2 (two) times daily as needed for anxiety or sleep.     ciclopirox (LOPROX) 0.77 % cream Apply 1 application. topically 2 (two) times daily as needed (foot fungus).     clotrimazole (LOTRIMIN) 1 % cream as needed.     empagliflozin (JARDIANCE) 10 MG TABS tablet Take 1 tablet (10 mg total) by mouth daily before breakfast. 90 tablet 3   FLUoxetine (PROZAC) 40 MG capsule Take 40 mg by mouth every morning.     fluticasone (FLONASE) 50 MCG/ACT nasal spray Place 1 spray into both nostrils at bedtime as needed for allergies or rhinitis.     furosemide (LASIX) 40 MG tablet Take 0.5 tablets (20 mg total) by mouth daily. 45 tablet 3   losartan (COZAAR) 25 MG tablet Take 12.5 mg by mouth daily.     melatonin 3 MG TABS tablet Take 6 mg by mouth at bedtime as needed (sleep).     metoprolol succinate (TOPROL XL) 25 MG 24 hr tablet Take 1.5 tablets (37.5 mg total) by mouth at bedtime. 135 tablet 3   Multiple Vitamin (MULTIVITAMIN) tablet Take 1 tablet by mouth daily.     Polyethyl Glycol-Propyl Glycol (SYSTANE) 0.4-0.3 % SOLN Place 1 drop into both eyes daily as needed (dry eyes).     potassium chloride (KLOR-CON) 10 MEQ tablet Take 1 tablet (10 mEq total) by mouth daily. 180 tablet 3   spironolactone (ALDACTONE) 25 MG tablet Take 0.5 tablets (12.5 mg total) by mouth daily. 15 tablet 11   TIROSINT 37.5 MCG CAPS Take 1 capsule by mouth every morning.     No current facility-administered medications on file prior to visit.    Allergies  Allergen Reactions   Crestor [Rosuvastatin Calcium] Other (See Comments)    Muscle aches   Pravastatin Other (See Comments)    Muscle aches    Propranolol Other (See Comments)    Caused severe weakness    Assessment/Plan: 1. CHF -   Assessment: Has not been weighing herself daily and was surprised by her weight today; no signs of volume overload Only  experiences dizziness when working out and moving positions quickly (bending over) Has not been monitoring blood pressure at home consistently, but is still in range. No other signs of low blood pressure besides dizziness Adherent to all her medications  Added more protein to diet; follows up with a nutritionist Has a trainer that helps her keep physical activity in her schedule Plan: Continue medications at current doses: with dizziness and systolic's in the 575'Y do not want to titrate medications at this time Start day with monitoring weight: if gain 3 lbs over night or 5 lbs within one week--> start taking lasix and monitor daily to see if need to continue lasix (back to dry weight) Continue to monitor for side effects of medications with losartan (inc dizziness) and jardiance (signs/symptoms of UTI) Continue to increase protein in diet and reach goal weight and working with Physiological scientist for physical activity  Follow up with primary care physician for further medication titrations   Thank you, Sandford Craze, PharmD. [facility] Acute Care PGY-1 09/06/2022 2:24 PM   Ramond Dial, Pharm.D, BCPS, CPP [facility] of [facility] [address] [place], [place] [address]  Phone: [phone]; Fax: [phone]

## 2022-11-03 DIAGNOSIS — E559 Vitamin D deficiency, unspecified: Secondary | ICD-10-CM | POA: Diagnosis not present

## 2022-11-03 DIAGNOSIS — E039 Hypothyroidism, unspecified: Secondary | ICD-10-CM | POA: Diagnosis not present

## 2022-11-03 DIAGNOSIS — Z0000 Encounter for general adult medical examination without abnormal findings: Secondary | ICD-10-CM | POA: Diagnosis not present

## 2022-11-03 DIAGNOSIS — D508 Other iron deficiency anemias: Secondary | ICD-10-CM | POA: Diagnosis not present

## 2022-11-03 DIAGNOSIS — I5022 Chronic systolic (congestive) heart failure: Secondary | ICD-10-CM | POA: Diagnosis not present

## 2022-11-03 DIAGNOSIS — E7800 Pure hypercholesterolemia, unspecified: Secondary | ICD-10-CM | POA: Diagnosis not present

## 2022-11-10 DIAGNOSIS — F419 Anxiety disorder, unspecified: Secondary | ICD-10-CM | POA: Diagnosis not present

## 2022-11-10 DIAGNOSIS — E782 Mixed hyperlipidemia: Secondary | ICD-10-CM | POA: Diagnosis not present

## 2022-11-10 DIAGNOSIS — E559 Vitamin D deficiency, unspecified: Secondary | ICD-10-CM | POA: Diagnosis not present

## 2022-11-10 DIAGNOSIS — Z0000 Encounter for general adult medical examination without abnormal findings: Secondary | ICD-10-CM | POA: Diagnosis not present

## 2022-11-10 DIAGNOSIS — I10 Essential (primary) hypertension: Secondary | ICD-10-CM | POA: Diagnosis not present

## 2022-11-10 DIAGNOSIS — I5022 Chronic systolic (congestive) heart failure: Secondary | ICD-10-CM | POA: Diagnosis not present

## 2022-11-10 DIAGNOSIS — R81 Glycosuria: Secondary | ICD-10-CM | POA: Diagnosis not present

## 2022-11-10 DIAGNOSIS — E039 Hypothyroidism, unspecified: Secondary | ICD-10-CM | POA: Diagnosis not present

## 2022-11-11 DIAGNOSIS — R81 Glycosuria: Secondary | ICD-10-CM | POA: Diagnosis not present

## 2023-01-04 DIAGNOSIS — Z1231 Encounter for screening mammogram for malignant neoplasm of breast: Secondary | ICD-10-CM | POA: Diagnosis not present

## 2023-01-05 ENCOUNTER — Telehealth: Payer: Self-pay | Admitting: Internal Medicine

## 2023-01-05 NOTE — Telephone Encounter (Signed)
Called pt. States that she has noticed her weight increase and some leg swelling. Her PCP office told her to take furosemide 20mg  BID x 1 week. States she noticed only a little increase in urination. Swelling is better this AM. Woke up last night in the middle of the night. Decided to weight herself. Took a frusemide. Noticed she was 148lb. Then his AM was 145.6lb. Advised this is normal because she took a water pill. Also states she feels hot all the time.  I advised to try taking furosemide 40mg  daily for the next week and call back to let us know how she is feeling.

## 2023-01-05 NOTE — Telephone Encounter (Signed)
Pt c/o medication issue:  1. Name of Medication:   furosemide (LASIX) 40 MG tablet   2. How are you currently taking this medication (dosage and times per day)?   As prescribed  3. Are you having a reaction (difficulty breathing--STAT)?   No  4. What is your medication issue?   Patient stated she lost weight overnight with this medication.  Patient stated she went from 148 lbs to 145.6 lbs overnight.  Patient stated her PCP Pharmacist told her to check with [facility] Pharmacist.  Patient stated can contact her through [facility] or by phone.

## 2023-02-05 NOTE — Progress Notes (Unsigned)
Office Visit    Patient Name: [name] Date of Encounter: 02/06/2023  PCP:  Fatima Sanger, FNP   [facility]  Cardiologist:  Orbie Pyo, MD  Advanced Practice Provider:  No care team member to display Electrophysiologist:  None   HPI    [name] is a 72 y.o. female who has a past medical history of anxiety, AVNRT, mitral valve prolapse, CHF, GERD, CAC score of 0, most recent LVEF 35 to 40% (07/11/2022) presents today for follow-up visit.  Pharm.D. saw the patient 06/02/2022 and the patient's blood pressure was low at 98/60.  Patient did report dizziness with losartan being started back in August.  Dizziness occurred when she would stand for long periods of time, not when she change positions.  Medication were not further titrated to dizzy.  Seen last by Pharm.D. 09/06/2022 patient spironolactone 12.5 mg daily was started and her potassium supplement was discontinued.  Restarted on Jardiance 10 mg (stopped due to history of recurrent UTIs).  Follow-up labs were WNL.  Patient did not need to take her Lasix for a month with no signs of volume overload.  Weight at that time was 138.6 pounds.  Today, she tells me that her weight has slowly gone up over the last few weeks.  She states typically she stays around 149 pounds but here recently she has been over 150.  We reviewed her last echocardiogram back in September and she feels like there has been a difference since then.  We discussed ordering another echocardiogram today for further evaluation.  We also discussed stopping her losartan since her blood pressure is low normal today.  She occasionally does have some dizziness in the mornings but it usually resolves.  Blood pressure log showed SBP anywhere from 80-120s.  Heart rate has been well-controlled.  She also tells me that she weaned herself off of Prozac over 2 months time.  Reports no chest pain, pressure, or tightness. No edema, orthopnea,  PND. Reports no palpitations.   Past Medical History    Past Medical History:  Diagnosis Date   Adenomatous polyp    Allergic rhinitis    Anxiety    AV Nodal Reentry Tachycardia    s/p RFCA GT 2/15   AVNRT (AV nodal re-entry tachycardia) 11/06/2013   Calcified granuloma of lung    Cataract    CHF (congestive heart failure)    Diverticulosis 01/2020   Gastritis    GERD (gastroesophageal reflux disease)    H/O radiofrequency ablation for complex left atrial arrhythmia 12/12/2013   Headache    Hematuria    Hemorrhoids    HTN (hypertension)    Hyperlipemia    Hypothyroidism    Insomnia    Iron deficiency    Irregular heart beat    Laryngopharyngeal reflux (LPR)    Migraine    Mitral valve prolapse    ?   Rectal leakage    1 time   Retinal tear of both eyes    Skin lesion of breast    Vitamin D deficiency    Vulvodynia    Past Surgical History:  Procedure Laterality Date   ABLATION  11/21/2013   RFCA of AVNRT by Dr Ladona Ridgel   BREAST LUMPECTOMY Left 2000   left breast-   CATARCT     RIGHT EYE IN 09/2015   COLONOSCOPY  2007, 2008   RETINAL DETACHMENT SURGERY     RIGHT EYE/SILICONE BUCKLE   RHINOPLASTY  1980   RIGHT HEART CATH N/A 04/07/2022   Procedure: RIGHT HEART CATH;  Surgeon: Orbie Pyo, MD;  Location: [facility] INVASIVE CV LAB;  Service: Cardiovascular;  Laterality: N/A;   SEPTOPLASTY     1980   skin lesions     left leg,right leg   SUPRAVENTRICULAR TACHYCARDIA ABLATION N/A 11/21/2013   Procedure: SUPRAVENTRICULAR TACHYCARDIA ABLATION;  Surgeon: Marinus Maw, MD;  Location: [facility] CATH LAB;  Service: Cardiovascular;  Laterality: N/A;    Allergies  Allergies  Allergen Reactions   Crestor [Rosuvastatin Calcium] Other (See Comments)    Muscle aches   Pravastatin Other (See Comments)    Muscle aches    Propranolol Other (See Comments)    Caused severe weakness    EKGs/Labs/Other Studies Reviewed:   The following studies were reviewed today:  Echo  07/11/22   IMPRESSIONS     1. Left ventricular ejection fraction, by estimation, is 35 to 40%. Left  ventricular ejection fraction by 3D volume is 38 %. The left ventricle has  moderately decreased function. The left ventricle demonstrates global  hypokinesis. There is mild  asymmetric left ventricular hypertrophy of the septal segment. Left  ventricular diastolic parameters are consistent with Grade I diastolic  dysfunction (impaired relaxation).   2. Right ventricular systolic function is normal. The right ventricular  size is normal. There is normal pulmonary artery systolic pressure.   3. A small pericardial effusion is present. The pericardial effusion is  circumferential.   4. The mitral valve is grossly normal. Mild to moderate mitral valve  regurgitation. The mean mitral valve gradient is 1.4 mmHg with average  heart rate of 63 bpm.   5. The aortic valve is tricuspid. Aortic valve regurgitation is not  visualized. Aortic valve sclerosis is present, with no evidence of aortic  valve stenosis.   6. The inferior vena cava is normal in size with greater than 50%  respiratory variability, suggesting right atrial pressure of 3 mmHg.   Comparison(s): No significant change from prior study.   FINDINGS   Left Ventricle: Left ventricular ejection fraction, by estimation, is 35  to 40%. Left ventricular ejection fraction by 3D volume is 38 %. The left  ventricle has moderately decreased function. The left ventricle  demonstrates global hypokinesis. The left  ventricular internal cavity size was normal in size. There is mild  asymmetric left ventricular hypertrophy of the septal segment. Left  ventricular diastolic parameters are consistent with Grade I diastolic  dysfunction (impaired relaxation).   Right Ventricle: The right ventricular size is normal. No increase in  right ventricular wall thickness. Right ventricular systolic function is  normal. There is normal pulmonary  artery systolic pressure. The tricuspid  regurgitant velocity is 1.31 m/s, and   with an assumed right atrial pressure of 3 mmHg, the estimated right  ventricular systolic pressure is 9.9 mmHg.   Left Atrium: Left atrial size was normal in size.   Right Atrium: Right atrial size was normal in size.   Pericardium: A small pericardial effusion is present. The pericardial  effusion is circumferential.   Mitral Valve: Mitral Valve Area (MVA) = 2.16 cm2, 1.25 cm2 by 2D  planimetry. The mitral valve is grossly normal. Mild to moderate mitral  valve regurgitation. The mean mitral valve gradient is 1.4 mmHg with  average heart rate of 63 bpm.   Tricuspid Valve: The tricuspid valve is normal in structure. Tricuspid  valve regurgitation is not demonstrated. No evidence of tricuspid  stenosis.  Aortic Valve: The aortic valve is tricuspid. Aortic valve regurgitation is  not visualized. Aortic valve sclerosis is present, with no evidence of  aortic valve stenosis.   Pulmonic Valve: The pulmonic valve was normal in structure. Pulmonic valve  regurgitation is not visualized. No evidence of pulmonic stenosis.   Aorta: The aortic root, ascending aorta and aortic arch are all  structurally normal, with no evidence of dilitation or obstruction.   Venous: The inferior vena cava is normal in size with greater than 50%  respiratory variability, suggesting right atrial pressure of 3 mmHg.   IAS/Shunts: No atrial level shunt detected by color flow Doppler.   EKG:  EKG is  ordered today.  The ekg ordered today demonstrates normal sinus rhythm, rate 74 bpm  Recent Labs: 03/25/2022: NT-Pro BNP 6,816 04/05/2022: Platelets 275 04/22/2022: Hemoglobin 11.5 08/24/2022: BUN 20; Creatinine, Ser 0.96; Potassium 4.5; Sodium 142  Recent Lipid Panel    Component Value Date/Time   CHOL 290 (H) 01/18/2012 0910   TRIG 127.0 01/18/2012 0910   HDL 54.40 01/18/2012 0910   CHOLHDL 5 01/18/2012 0910   VLDL 25.4  01/18/2012 0910   LDLDIRECT 209.4 01/18/2012 0910     Home Medications   Current Meds  Medication Sig   ALPRAZolam (XANAX) 0.25 MG tablet Take 0.25 mg by mouth 2 (two) times daily as needed for anxiety or sleep.   ciclopirox (LOPROX) 0.77 % cream Apply 1 application. topically 2 (two) times daily as needed (foot fungus).   clotrimazole (LOTRIMIN) 1 % cream as needed.   empagliflozin (JARDIANCE) 10 MG TABS tablet Take 1 tablet (10 mg total) by mouth daily before breakfast.   FLUoxetine (PROZAC) 40 MG capsule Take 40 mg by mouth every morning.   furosemide (LASIX) 40 MG tablet Take 20 mg by mouth daily as needed for fluid or edema.   losartan (COZAAR) 25 MG tablet Take 12.5 mg by mouth daily.   melatonin 3 MG TABS tablet Take 6 mg by mouth at bedtime as needed (sleep).   metoprolol succinate (TOPROL XL) 25 MG 24 hr tablet Take 1.5 tablets (37.5 mg total) by mouth at bedtime.   spironolactone (ALDACTONE) 25 MG tablet Take 0.5 tablets (12.5 mg total) by mouth daily.   TIROSINT 37.5 MCG CAPS Take 1 capsule by mouth every morning.     Review of Systems      All other systems reviewed and are otherwise negative except as noted above.  Physical Exam    VS:  BP 90/66   Pulse 74   Ht  (1.6 m)   Wt 153 lb 3.2 oz (69.5 kg)   SpO2 96%   BMI 27.14 kg/m  , BMI Body mass index is 27.14 kg/m.  Wt Readings from Last 3 Encounters:  02/06/23 153 lb 3.2 oz (69.5 kg)  09/06/22 138 lb 9.6 oz (62.9 kg)  07/19/22 131 lb 3.2 oz (59.5 kg)     GEN: Well nourished, well developed, in no acute distress. HEENT: normal. Neck: Supple, no JVD, carotid bruits, or masses. Cardiac: RRR, no murmurs, rubs, or gallops. No clubbing, cyanosis, edema.  Radials/PT 2+ and equal bilaterally.  Respiratory:  Respirations regular and unlabored, clear to auscultation bilaterally. GI: Soft, nontender, nondistended. MS: No deformity or atrophy. Skin: Warm and dry, no rash. Neuro:  Strength and sensation are  intact. Psych: Normal affect.  Assessment & Plan    Nonischemic cardiomyopathy/chronic systolic HF -Update echocardiogram -Continue current medications including Jardiance 10 mg daily, Lasix 40  mg daily with an extra 20 mg as needed for lower extremity edema, increase spironolactone to 25 mg daily, stop potassium supplementation, stop losartan 12.5 mg daily due to hypotension -Maintain low-sodium, heart healthy diet -Please track your weight daily and record -BMP and BNP in 1 week  Hyperlipidemia -She will be due for lipid panel next time she comes in   History of radiofrequency ablation for complex left atrial arrhythmia -she continued to have occasional palpitations       Disposition: Follow up 3 months with Orbie Pyo, MD or APP.  Signed, Sharlene Dory, PA-C 02/06/2023, 11:09 AM [facility]

## 2023-02-06 ENCOUNTER — Ambulatory Visit: Payer: Medicare Other | Attending: Physician Assistant | Admitting: Physician Assistant

## 2023-02-06 ENCOUNTER — Encounter: Payer: Self-pay | Admitting: Physician Assistant

## 2023-02-06 VITALS — BP 90/66 | HR 74 | Ht 63.0 in | Wt 153.2 lb

## 2023-02-06 DIAGNOSIS — I5022 Chronic systolic (congestive) heart failure: Secondary | ICD-10-CM | POA: Diagnosis not present

## 2023-02-06 DIAGNOSIS — I428 Other cardiomyopathies: Secondary | ICD-10-CM

## 2023-02-06 DIAGNOSIS — R002 Palpitations: Secondary | ICD-10-CM | POA: Diagnosis not present

## 2023-02-06 DIAGNOSIS — R600 Localized edema: Secondary | ICD-10-CM

## 2023-02-06 DIAGNOSIS — E785 Hyperlipidemia, unspecified: Secondary | ICD-10-CM

## 2023-02-06 DIAGNOSIS — I4729 Other ventricular tachycardia: Secondary | ICD-10-CM

## 2023-02-06 MED ORDER — SPIRONOLACTONE 25 MG PO TABS: 25.0000 mg | ORAL_TABLET | Freq: Every day | ORAL | 11 refills | Status: DC

## 2023-02-06 MED ORDER — FUROSEMIDE 40 MG PO TABS: ORAL_TABLET | ORAL | 11 refills | Status: DC

## 2023-02-06 NOTE — Addendum Note (Signed)
Addended by: Valrie Hart on: 02/06/2023 11:26 AM   Modules accepted: Orders

## 2023-02-06 NOTE — Patient Instructions (Addendum)
Medication Instructions:  1.Stop losartan 2.Stop potassium 3.You may take an extra 1/2 tablet of lasix as needed 4.Increase sprionolactone to 25 mg daily *If you need a refill on your cardiac medications before your next appointment, please call your pharmacy*  Lab Work: BMET and BNP in a week If you have labs (blood work) drawn today and your tests are completely normal, you will receive your results only by: MyChart Message (if you have MyChart) OR A paper copy in the mail If you have any lab test that is abnormal or we need to change your treatment, we will call you to review the results.  Testing/Procedures: Your physician has requested that you have a STAT echocardiogram. Echocardiography is a painless test that uses sound waves to create images of your heart. It provides your doctor with information about the size and shape of your heart and how well your heart's chambers and valves are working. This procedure takes approximately one hour. There are no restrictions for this procedure. Please do NOT wear cologne, perfume, aftershave, or lotions (deodorant is allowed). Please arrive 15 minutes prior to your appointment time.   Follow-Up: At [facility], you and your health needs are our priority.  As part of our continuing mission to provide you with exceptional heart care, we have created designated Provider Care Teams.  These Care Teams include your primary Cardiologist (physician) and Advanced Practice Providers (APPs -  Physician Assistants and Nurse Practitioners) who all work together to provide you with the care you need, when you need it.  Your next appointment:   3 months  Provider:   Orbie Pyo, MD  or Jari Favre, PA-C        Other Instructions Check your blood pressure daily, 1-2 hrs after taking your morning medications for 2 weeks, keep a log and send [name] the readings through mychart at the end of the 2 weeks  Weigh every morning after using the  restroom, before breakfast and let us know if you have a weight gain of 2 or more lbs overnight or 5 or more lbs in a week  Heart-Healthy Eating Plan Many factors influence your heart health, including eating and exercise habits. Heart health is also called coronary health. Coronary risk increases with abnormal blood fat (lipid) levels. A heart-healthy eating plan includes limiting unhealthy fats, increasing healthy fats, limiting salt (sodium) intake, and making other diet and lifestyle changes. What is my plan? Your health care provider may recommend that: You limit your fat intake to _________% or less of your total calories each day. You limit your saturated fat intake to _________% or less of your total calories each day. You limit the amount of cholesterol in your diet to less than _________ mg per day. You limit the amount of sodium in your diet to less than _________ mg per day. What are tips for following this plan? Cooking Cook foods using methods other than frying. Baking, boiling, grilling, and broiling are all good options. Other ways to reduce fat include: Removing the skin from poultry. Removing all visible fats from meats. Steaming vegetables in water or broth. Meal planning  At meals, imagine dividing your plate into fourths: Fill one-half of your plate with vegetables and green salads. Fill one-fourth of your plate with whole grains. Fill one-fourth of your plate with lean protein foods. Eat 2-4 cups of vegetables per day. One cup of vegetables equals 1 cup (91 g) broccoli or cauliflower florets, 2 medium carrots, 1 large  bell pepper, 1 large sweet potato, 1 large tomato, 1 medium white potato, 2 cups (150 g) raw leafy greens. Eat 1-2 cups of fruit per day. One cup of fruit equals 1 small apple, 1 large banana, 1 cup (237 g) mixed fruit, 1 large orange,  cup (82 g) dried fruit, 1 cup (240 mL) 100% fruit juice. Eat more foods that contain soluble fiber. Examples include  apples, broccoli, carrots, beans, peas, and barley. Aim to get 25-30 g of fiber per day. Increase your consumption of legumes, nuts, and seeds to 4-5 servings per week. One serving of dried beans or legumes equals  cup (90 g) cooked, 1 serving of nuts is  oz (12 almonds, 24 pistachios, or 7 walnut halves), and 1 serving of seeds equals  oz (8 g). Fats Choose healthy fats more often. Choose monounsaturated and polyunsaturated fats, such as olive and canola oils, avocado oil, flaxseeds, walnuts, almonds, and seeds. Eat more omega-3 fats. Choose salmon, mackerel, sardines, tuna, flaxseed oil, and ground flaxseeds. Aim to eat fish at least 2 times each week. Check food labels carefully to identify foods with trans fats or high amounts of saturated fat. Limit saturated fats. These are found in animal products, such as meats, butter, and cream. Plant sources of saturated fats include palm oil, palm kernel oil, and coconut oil. Avoid foods with partially hydrogenated oils in them. These contain trans fats. Examples are stick margarine, some tub margarines, cookies, crackers, and other baked goods. Avoid fried foods. General information Eat more home-cooked food and less restaurant, buffet, and fast food. Limit or avoid alcohol. Limit foods that are high in added sugar and simple starches such as foods made using white refined flour (white breads, pastries, sweets). Lose weight if you are overweight. Losing just 5-10% of your body weight can help your overall health and prevent diseases such as diabetes and heart disease. Monitor your sodium intake, especially if you have high blood pressure. Talk with your health care provider about your sodium intake. Try to incorporate more vegetarian meals weekly. What foods should I eat? Fruits All fresh, canned (in natural juice), or frozen fruits. Vegetables Fresh or frozen vegetables (raw, steamed, roasted, or grilled). Green salads. Grains Most grains.  Choose whole wheat and whole grains most of the time. Rice and pasta, including brown rice and pastas made with whole wheat. Meats and other proteins Lean, well-trimmed beef, veal, pork, and lamb. Chicken and [place] without skin. All fish and shellfish. Wild duck, rabbit, pheasant, and venison. Egg whites or low-cholesterol egg substitutes. Dried beans, peas, lentils, and tofu. Seeds and most nuts. Dairy Low-fat or nonfat cheeses, including ricotta and mozzarella. Skim or 1% milk (liquid, powdered, or evaporated). Buttermilk made with low-fat milk. Nonfat or low-fat yogurt. Fats and oils Non-hydrogenated (trans-free) margarines. Vegetable oils, including soybean, sesame, sunflower, olive, avocado, peanut, safflower, corn, canola, and cottonseed. Salad dressings or mayonnaise made with a vegetable oil. Beverages Water (mineral or sparkling). Coffee and tea. Unsweetened ice tea. Diet beverages. Sweets and desserts Sherbet, gelatin, and fruit ice. Small amounts of dark chocolate. Limit all sweets and desserts. Seasonings and condiments All seasonings and condiments. The items listed above may not be a complete list of foods and beverages you can eat. Contact a dietitian for more options. What foods should I avoid? Fruits Canned fruit in heavy syrup. Fruit in cream or butter sauce. Fried fruit. Limit coconut. Vegetables Vegetables cooked in cheese, cream, or butter sauce. Fried vegetables. Grains Breads made with  saturated or trans fats, oils, or whole milk. Croissants. Sweet rolls. Donuts. High-fat crackers, such as cheese crackers and chips. Meats and other proteins Fatty meats, such as hot dogs, ribs, sausage, bacon, rib-eye roast or steak. High-fat deli meats, such as salami and bologna. Caviar. Domestic duck and goose. Organ meats, such as liver. Dairy Cream, sour cream, cream cheese, and creamed cottage cheese. Whole-milk cheeses. Whole or 2% milk (liquid, evaporated, or condensed). Whole  buttermilk. Cream sauce or high-fat cheese sauce. Whole-milk yogurt. Fats and oils Meat fat, or shortening. Cocoa butter, hydrogenated oils, palm oil, coconut oil, palm kernel oil. Solid fats and shortenings, including bacon fat, salt pork, lard, and butter. Nondairy cream substitutes. Salad dressings with cheese or sour cream. Beverages Regular sodas and any drinks with added sugar. Sweets and desserts Frosting. Pudding. Cookies. Cakes. Pies. Milk chocolate or white chocolate. Buttered syrups. Full-fat ice cream or ice cream drinks. The items listed above may not be a complete list of foods and beverages to avoid. Contact a dietitian for more information. Summary Heart-healthy meal planning includes limiting unhealthy fats, increasing healthy fats, limiting salt (sodium) intake and making other diet and lifestyle changes. Lose weight if you are overweight. Losing just 5-10% of your body weight can help your overall health and prevent diseases such as diabetes and heart disease. Focus on eating a balance of foods, including fruits and vegetables, low-fat or nonfat dairy, lean protein, nuts and legumes, whole grains, and heart-healthy oils and fats. This information is not intended to replace advice given to you by your health care provider. Make sure you discuss any questions you have with your health care provider. Document Revised: 11/08/2021 Document Reviewed: 11/08/2021 Elsevier Patient Education  2023 [employer]  Low-Sodium Eating Plan Sodium, which is an element that makes up salt, helps you maintain a healthy balance of fluids in your body. Too much sodium can increase your blood pressure and cause fluid and waste to be held in your body. Your health care provider or dietitian may recommend following this plan if you have high blood pressure (hypertension), kidney disease, liver disease, or heart failure. Eating less sodium can help lower your blood pressure, reduce swelling, and protect  your heart, liver, and kidneys. What are tips for following this plan? Reading food labels The Nutrition Facts label lists the amount of sodium in one serving of the food. If you eat more than one serving, you must multiply the listed amount of sodium by the number of servings. Choose foods with less than 140 mg of sodium per serving. Avoid foods with 300 mg of sodium or more per serving. Shopping  Look for lower-sodium products, often labeled as "low-sodium" or "no salt added." Always check the sodium content, even if foods are labeled as "unsalted" or "no salt added." Buy fresh foods. Avoid canned foods and pre-made or frozen meals. Avoid canned, cured, or processed meats. Buy breads that have less than 80 mg of sodium per slice. Cooking  Eat more home-cooked food and less restaurant, buffet, and fast food. Avoid adding salt when cooking. Use salt-free seasonings or herbs instead of table salt or sea salt. Check with your health care provider or pharmacist before using salt substitutes. Cook with plant-based oils, such as canola, sunflower, or olive oil. Meal planning When eating at a restaurant, ask that your food be prepared with less salt or no salt, if possible. Avoid dishes labeled as brined, pickled, cured, smoked, or made with soy sauce, miso, or teriyaki  sauce. Avoid foods that contain MSG (monosodium glutamate). MSG is sometimes added to [place] food, bouillon, and some canned foods. Make meals that can be grilled, baked, poached, roasted, or steamed. These are generally made with less sodium. General information Most people on this plan should limit their sodium intake to 1,500-2,000 mg (milligrams) of sodium each day. What foods should I eat? Fruits Fresh, frozen, or canned fruit. Fruit juice. Vegetables Fresh or frozen vegetables. "No salt added" canned vegetables. "No salt added" tomato sauce and paste. Low-sodium or reduced-sodium tomato and vegetable  juice. Grains Low-sodium cereals, including oats, puffed wheat and rice, and shredded wheat. Low-sodium crackers. Unsalted rice. Unsalted pasta. Low-sodium bread. Whole-grain breads and whole-grain pasta. Meats and other proteins Fresh or frozen (no salt added) meat, poultry, seafood, and fish. Low-sodium canned tuna and salmon. Unsalted nuts. Dried peas, beans, and lentils without added salt. Unsalted canned beans. Eggs. Unsalted nut butters. Dairy Milk. Soy milk. Cheese that is naturally low in sodium, such as ricotta cheese, fresh mozzarella, or Swiss cheese. Low-sodium or reduced-sodium cheese. Cream cheese. Yogurt. Seasonings and condiments Fresh and dried herbs and spices. Salt-free seasonings. Low-sodium mustard and ketchup. Sodium-free salad dressing. Sodium-free light mayonnaise. Fresh or refrigerated horseradish. Lemon juice. Vinegar. Other foods Homemade, reduced-sodium, or low-sodium soups. Unsalted popcorn and pretzels. Low-salt or salt-free chips. The items listed above may not be a complete list of foods and beverages you can eat. Contact a dietitian for more information. What foods should I avoid? Vegetables Sauerkraut, pickled vegetables, and relishes. Olives. [place] fries. Onion rings. Regular canned vegetables (not low-sodium or reduced-sodium). Regular canned tomato sauce and paste (not low-sodium or reduced-sodium). Regular tomato and vegetable juice (not low-sodium or reduced-sodium). Frozen vegetables in sauces. Grains Instant hot cereals. Bread stuffing, pancake, and biscuit mixes. Croutons. Seasoned rice or pasta mixes. Noodle soup cups. Boxed or frozen macaroni and cheese. Regular salted crackers. Self-rising flour. Meats and other proteins Meat or fish that is salted, canned, smoked, spiced, or pickled. Precooked or cured meat, such as sausages or meat loaves. Tomasa Blase. Ham. Pepperoni. Hot dogs. Corned beef. Chipped beef. Salt pork. Jerky. Pickled herring. Anchovies and  sardines. Regular canned tuna. Salted nuts. Dairy Processed cheese and cheese spreads. Hard cheeses. Cheese curds. Blue cheese. Feta cheese. String cheese. Regular cottage cheese. Buttermilk. Canned milk. Fats and oils Salted butter. Regular margarine. Ghee. Bacon fat. Seasonings and condiments Onion salt, garlic salt, seasoned salt, table salt, and sea salt. Canned and packaged gravies. Worcestershire sauce. Tartar sauce. Barbecue sauce. Teriyaki sauce. Soy sauce, including reduced-sodium. Steak sauce. Fish sauce. Oyster sauce. Cocktail sauce. Horseradish that you find on the shelf. Regular ketchup and mustard. Meat flavorings and tenderizers. Bouillon cubes. Hot sauce. Pre-made or packaged marinades. Pre-made or packaged taco seasonings. Relishes. Regular salad dressings. Salsa. Other foods Salted popcorn and pretzels. Corn chips and puffs. Potato and tortilla chips. Canned or dried soups. Pizza. Frozen entrees and pot pies. The items listed above may not be a complete list of foods and beverages you should avoid. Contact a dietitian for more information. Summary Eating less sodium can help lower your blood pressure, reduce swelling, and protect your heart, liver, and kidneys. Most people on this plan should limit their sodium intake to 1,500-2,000 mg (milligrams) of sodium each day. Canned, boxed, and frozen foods are high in sodium. Restaurant foods, fast foods, and pizza are also very high in sodium. You also get sodium by adding salt to food. Try to cook at home, eat more fresh  fruits and vegetables, and eat less fast food and canned, processed, or prepared foods. This information is not intended to replace advice given to you by your health care provider. Make sure you discuss any questions you have with your health care provider. Document Revised: 09/09/2019 Document Reviewed: 09/04/2019 Elsevier Patient Education  2023 [employer].

## 2023-02-08 ENCOUNTER — Ambulatory Visit (HOSPITAL_COMMUNITY): Payer: Medicare Other | Attending: Physician Assistant

## 2023-02-08 DIAGNOSIS — R600 Localized edema: Secondary | ICD-10-CM | POA: Diagnosis not present

## 2023-02-08 LAB — ECHOCARDIOGRAM COMPLETE
Area-P 1/2: 2.68 cm2
S' Lateral: 2.8 cm

## 2023-02-15 ENCOUNTER — Other Ambulatory Visit: Payer: Self-pay | Admitting: *Deleted

## 2023-02-15 DIAGNOSIS — I5022 Chronic systolic (congestive) heart failure: Secondary | ICD-10-CM | POA: Diagnosis not present

## 2023-02-15 DIAGNOSIS — R002 Palpitations: Secondary | ICD-10-CM

## 2023-02-15 DIAGNOSIS — E785 Hyperlipidemia, unspecified: Secondary | ICD-10-CM

## 2023-02-15 DIAGNOSIS — I428 Other cardiomyopathies: Secondary | ICD-10-CM

## 2023-02-15 DIAGNOSIS — I4729 Other ventricular tachycardia: Secondary | ICD-10-CM

## 2023-02-16 LAB — BASIC METABOLIC PANEL
BUN/Creatinine Ratio: 34 — ABNORMAL HIGH (ref 12–28)
BUN: 32 mg/dL — ABNORMAL HIGH (ref 8–27)
CO2: 19 mmol/L — ABNORMAL LOW (ref 20–29)
Calcium: 9.4 mg/dL (ref 8.7–10.3)
Chloride: 99 mmol/L (ref 96–106)
Creatinine, Ser: 0.95 mg/dL (ref 0.57–1.00)
Glucose: 79 mg/dL (ref 70–99)
Potassium: 4.1 mmol/L (ref 3.5–5.2)
Sodium: 138 mmol/L (ref 134–144)
eGFR: 64 mL/min/{1.73_m2} (ref >59)

## 2023-02-16 LAB — PRO B NATRIURETIC PEPTIDE: NT-Pro BNP: 177 pg/mL (ref 0–301)

## 2023-02-20 ENCOUNTER — Encounter: Payer: Self-pay | Admitting: Internal Medicine

## 2023-03-01 ENCOUNTER — Other Ambulatory Visit: Payer: Self-pay | Admitting: Internal Medicine

## 2023-04-27 ENCOUNTER — Telehealth: Payer: Self-pay | Admitting: Internal Medicine

## 2023-04-27 DIAGNOSIS — I428 Other cardiomyopathies: Secondary | ICD-10-CM

## 2023-04-27 DIAGNOSIS — I5022 Chronic systolic (congestive) heart failure: Secondary | ICD-10-CM

## 2023-04-27 NOTE — Telephone Encounter (Signed)
BP readings are low, yesterday and one day last week readings in 70s/ 104/71 this afternoon, HRs are 70s-80s Feels swollen in her legs, doesn't see it.  Up to 158 pounds.  Taking 40 mg Lasix daily. Drinking about 8 glasses of water daily. No shortness of breath.  Still going to trainer twice a week.  Doing same activities.  Eaten potato chips over the last week for the first time in months. No other dietary changes.  The weight has gradually crept up  May 29- 153.6 June 3 155 June 22 157.0 July 1 156.8 July 3 158.0 Today: 158.7   She contacted PCP yesterday who recommended she should go to the [facility]   She has occas taken an extra 1/2 tablet (20 mg) but not seeing any improvement.   Takes Jardiance 10 around 6am Spironolactone 25 mg around 6:00 pm Toprol 25 mg - TAKING 37.5 mg  Is off losartan.  Per Dr. Lynnette Caffey, I advised her to skip spironolactone and Toprol doses today and starting tomorrow she should reduce Toprol to just 1/2 tablet daily at bedtime. Also adv she should take Lasix 40 twice a day for 3 days and get blood work on Monday 05/01/23.   Seeing APP 05/02/23. Continue daily weights and blood pressures.  If BP readings in 70s - should call EMS/go to ER.

## 2023-04-27 NOTE — Telephone Encounter (Signed)
Pt c/o BP issue: STAT if pt c/o blurred vision, one-sided weakness or slurred speech  1. What are your last 5 BP readings?   105/78  HR 87 - 7/11 78/59  HR 89 - 7/10 72/59  HR 80 - 7/7 111/75  HR 94  - 7/2  2. Are you having any other symptoms (ex. Dizziness, headache, blurred vision, passed out)?   Very tired during the day.  3. What is your BP issue?    Patient stated she has been having very low BP readings and fluid retention.  Patient stated she has been gaining weight and is currently 158.7 lbs

## 2023-04-30 NOTE — Progress Notes (Unsigned)
Cardiology Office Note:  .   Date:  05/03/2023  ID:  [name], DOB [birth year], MRN [id number] PCP: Fatima Sanger, FNP  [facility] Providers Cardiologist:  Orbie Pyo, MD    Patient Profile: .      PMH Mitral valve prolapse Hypertension AVNRT S/p radiofrequency ablation  Coronary artery calcium score of 0 Coronary CTA 2023 with nonobstructive CAD NICM HFrEF EF 35-40 on TTE 07/11/22 EF 45-50 on TTE 02/08/23  Previously followed by Dr. Jacinto Halim.   Referred to cardiology to establish care and seen by Dr. Lynnette Caffey 02/17/22 for chronic systolic heart failure and mild cardiomyopathy with global hypokinesis following hospitalization 01/27/22 for acute on chronic combined CHF.  TTE at that time revealed EF 45 to 50%. She reported shortness of breath and fatigue as well as palpitations at night. BNP was elevated. GDMT was initiated including starting Jardiance 10 mg daily, stopping diltizem, and starting metoprolol and furosemide. Zaroxalyn 2.5 mg 3 days per week was also added. Coronary CTA ordered to evaluate for ischemia which was completed 03/08/22 and revealed coronary calcium score of 0, no evidence of coronary artery disease. It revealed bilateral pleural effusion.  Event monitor revealed 11 runs of SVT as well as 5 runs of NSVT.  Her Toprol XL was increased to 37.5 mg daily.  proBNP was  > 7500 and Lasix was increased. She underwent right heart cath on 04/07/2022 which revealed elevated V waves but preserved cardiac output and index. Repeat echo 04/13/22 revealed LVEF 35 to 40%, global HK, G3DD.   CMRI completed 06/29/22 revealed LVEF 42%, small pericardial effusion, no evidence of infiltrative cardiomyopathy.   Last cardiology clinic visit was 02/06/2023 with Jari Favre, PA.  She reported dizziness with losartan in August 2023. She did not take Lasix for a month and at time of Pharm D visit 09/06/22 and weight was 138.6 lbs. At [facility] 4/22, she reported gradual weight gain with  current base weight 149 lb. BP log revealed SBP 80-120s. Losartan was discontinued. Follow-up BNP and BMET completed 02/15/23 with elevated BUN and normal BNP. She was encouraged to increase water intake. Echo 02/08/23 revealed improved LVEF45-50%, G1DD, global HK, normal RV, mild MR.          History of Present Illness: .   [name] is a pleasant  72 y.o. female for follow-up.  She contacted our office on 04/27/2023 to report weight gain, fluid retention, and hypotension. SBP 72-111, HR 80s to 90s. She reported that she was taking Toprol XL 37.5 mg daily, Jardiance 10 mg daily and spironolactone 25 mg daily, no longer taking losartan. She was advised to hold spironolactone and Toprol doses on that day and reduce Toprol the following day to half a tablet daily (12.5 mg).  She was advised to take Lasix 40 mg twice daily for 3 days and was scheduled for appointment. She has resumed spironolactone 12.5 mg daily. She has seen multiple providers at [facility] and is concerned that our office and PCP office do not share information about dosages of medications. She has mild dizziness with low BP readings, but not like when she was taking losartan when dizziness was more persistent. Has coughing when she is lying down. Notes pressure in legs and increased leg swelling at end of day. After reducing her metoprolol dose, she noted her heart beating irregularly when lying on her left side.  Has noted a few occasions when BP monitor picked up irregular HR. Feels fatigued much of  the time, particularly with lower BP readings. She denies shortness of breath, dyspnea, chest discomfort, orthopnea, PND. Home scale 158.2 lb increasing 1 to 2 lbs every few weeks. She weighed 151 lb on 5/18. Limits fluids to 8 glasses per day, drinks more 2% milk than water. Admits to eating ice cream frequently. No alcohol consumption. Admits she likes to eat out, does not like to cook for herself. Home BP readings reviewed. Most hypotensive  episodes occur early in the day, but occur in the afternoon also. She staggers her medications to avoid hypotension. Home BP has started to come up a little today. She denies chest pain, dyspnea, orthopnea, PND, presyncope, syncope.   ROS: See HPI       Studies Reviewed: .         Risk Assessment/Calculations:             Physical Exam:   VS:  BP 114/82   Pulse (!) 105   Ht 5\' 4"  (1.626 m)   Wt 160 lb 9.6 oz (72.8 kg)   SpO2 98%   BMI 27.57 kg/m    Wt Readings from Last 3 Encounters:  05/02/23 160 lb 9.6 oz (72.8 kg)  02/06/23 153 lb 3.2 oz (69.5 kg)  09/06/22 138 lb 9.6 oz (62.9 kg)    GEN: Well nourished, well developed in no acute distress NECK: No JVD; No carotid bruits CARDIAC: RRR, no murmurs, rubs, gallops RESPIRATORY:  Clear to auscultation without rales, wheezing or rhonchi  ABDOMEN: Soft, non-tender, non-distended EXTREMITIES:  No edema; No deformity     ASSESSMENT AND PLAN: .    Chronic combined CHF/NICM: She presented 01/2022 with acute CHF. Echo at that time revealed LVEF 45-50%. GDMT for CHF was initiated. Seen as a new patient by Dr. Lynnette Caffey 02/2022. Underwent right heart cath on 04/07/2022 which revealed elevated V waves but preserved cardiac output and index. Repeat echo 04/13/22 revealed LVEF 35 to 40%, global HK, G3DD. CMRI completed 06/29/22 revealed LVEF 42%, small pericardial effusion, no evidence of infiltrative cardiomyopathy. Most recent echo 02/08/2023 reveals LVEF 45 to 50%, G1 DD.  She has maintained consistent follow-up. Today, she reports concerns that weight is increasing and BP is too low. Her medications were adjusted 04/27/23. Currently taking Jardiance 10 mg, spironolactone 12.5 mg, Toprol XL 12.5 mg and Lasix 40 mg daily. Home weight is stable over the past few days, but continues to increase gradually from initial visit.  Encouraged reduction in intake of ice cream and milk. She appears euvolemic on exam. No significant shortness of breath or dyspnea.  Due to recent medication adjustments and concern about weight, I am hesitant to make further adjustments.  Advised her to hold Lasix for SBP < 100 mmHg. Advised that if hypotension persists, weight increases, or she has additional concerns prior to appointment previously scheduled for next week, to call or send [name] a message.   Hypertension/Recent hypotension: BP is stable in our office. Hypotension over the past several weeks/months. No syncope. Lengthy discussion about appropriate BP parameters. She was concerned about advice to go to the hospital for SBP in the 70s. Advised she should seek emergency medical attention for sustained BP in the 70s, particularly if she is symptomatic. Her BP did improve when she had readings with SBP 70s to 80s. Encouraged salt intake and hydration when these episodes occur. She is going to hold diuretics for SBP < 100.   Hyperlipidemia: LDL 222 on 11/03/22. Coronary calcium score of 0, no evidence of  CAD on CCTA 03/08/22. ASCVD risk score is 9.5%. LDL goal < 70. Not specifically addressed today.   NSVT: Cardiac monitor revealed supraventricular tachycardia, question of nonsustained ventricular tachycardia versus SVT with aberrancy. She denies palpitations. Noted more palpitations initially on lower dose of metoprolol but this has improved. Continue Toprol XL 12.5 mg daily.   Medication management: We reviewed her medication list in detail and corrected discrepancies. Explained that there is lack in communication between our EMR and her PCP office. Advised that prescriber should document any dose changes that occur. Encouraged her to keep an up to date list of medications with her and at home.         Dispo: Keep your appointment on 7/23 with Jari Favre, PA.   Signed, Eligha Bridegroom, NP-C

## 2023-05-01 DIAGNOSIS — I5022 Chronic systolic (congestive) heart failure: Secondary | ICD-10-CM | POA: Diagnosis not present

## 2023-05-01 DIAGNOSIS — I428 Other cardiomyopathies: Secondary | ICD-10-CM | POA: Diagnosis not present

## 2023-05-02 ENCOUNTER — Encounter: Payer: Self-pay | Admitting: Nurse Practitioner

## 2023-05-02 ENCOUNTER — Ambulatory Visit: Payer: Medicare Other | Attending: Nurse Practitioner | Admitting: Nurse Practitioner

## 2023-05-02 VITALS — BP 114/82 | HR 105 | Ht 64.0 in | Wt 160.6 lb

## 2023-05-02 DIAGNOSIS — I5042 Chronic combined systolic (congestive) and diastolic (congestive) heart failure: Secondary | ICD-10-CM | POA: Insufficient documentation

## 2023-05-02 DIAGNOSIS — Z79899 Other long term (current) drug therapy: Secondary | ICD-10-CM | POA: Insufficient documentation

## 2023-05-02 DIAGNOSIS — E785 Hyperlipidemia, unspecified: Secondary | ICD-10-CM | POA: Insufficient documentation

## 2023-05-02 DIAGNOSIS — I4729 Other ventricular tachycardia: Secondary | ICD-10-CM | POA: Insufficient documentation

## 2023-05-02 DIAGNOSIS — I428 Other cardiomyopathies: Secondary | ICD-10-CM | POA: Insufficient documentation

## 2023-05-02 DIAGNOSIS — I10 Essential (primary) hypertension: Secondary | ICD-10-CM | POA: Diagnosis not present

## 2023-05-02 DIAGNOSIS — I952 Hypotension due to drugs: Secondary | ICD-10-CM | POA: Diagnosis not present

## 2023-05-02 LAB — PRO B NATRIURETIC PEPTIDE: NT-Pro BNP: 56 pg/mL (ref 0–301)

## 2023-05-02 LAB — BASIC METABOLIC PANEL
BUN/Creatinine Ratio: 25 (ref 12–28)
BUN: 28 mg/dL — ABNORMAL HIGH (ref 8–27)
CO2: 25 mmol/L (ref 20–29)
Calcium: 9.6 mg/dL (ref 8.7–10.3)
Chloride: 98 mmol/L (ref 96–106)
Creatinine, Ser: 1.1 mg/dL — ABNORMAL HIGH (ref 0.57–1.00)
Glucose: 98 mg/dL (ref 70–99)
Potassium: 4.4 mmol/L (ref 3.5–5.2)
Sodium: 139 mmol/L (ref 134–144)
eGFR: 54 mL/min/{1.73_m2} — ABNORMAL LOW (ref >59)

## 2023-05-02 MED ORDER — FUROSEMIDE 40 MG PO TABS: 40.0000 mg | ORAL_TABLET | Freq: Two times a day (BID) | ORAL | Status: DC

## 2023-05-02 NOTE — Patient Instructions (Signed)
Medication Instructions:   IF your systolic (top number) of blood pressure is 100 or below you are going to hold Lasix until it goes above 100.  *If you need a refill on your cardiac medications before your next appointment, please call your pharmacy*   Lab Work:  None ordered.  If you have labs (blood work) drawn today and your tests are completely normal, you will receive your results only by: MyChart Message (if you have MyChart) OR A paper copy in the mail If you have any lab test that is abnormal or we need to change your treatment, we will call you to review the results.   Testing/Procedures:  None ordered.   Follow-Up: At [facility], you and your health needs are our priority.  As part of our continuing mission to provide you with exceptional heart care, we have created designated Provider Care Teams.  These Care Teams include your primary Cardiologist (physician) and Advanced Practice Providers (APPs -  Physician Assistants and Nurse Practitioners) who all work together to provide you with the care you need, when you need it.  We recommend signing up for the patient portal called "MyChart".  Sign up information is provided on this After Visit Summary.  MyChart is used to connect with patients for Virtual Visits (Telemedicine).  Patients are able to view lab/test results, encounter notes, upcoming appointments, etc.  Non-urgent messages can be sent to your provider as well.   To learn more about what you can do with MyChart, go to [email or web address].    Your next appointment:   1 week(s)  Provider:   Jari Favre, PA-C

## 2023-05-03 ENCOUNTER — Encounter: Payer: Self-pay | Admitting: Nurse Practitioner

## 2023-05-09 ENCOUNTER — Encounter: Payer: Self-pay | Admitting: Physician Assistant

## 2023-05-09 ENCOUNTER — Ambulatory Visit: Payer: Medicare Other | Attending: Physician Assistant | Admitting: Physician Assistant

## 2023-05-09 VITALS — BP 112/64 | HR 85 | Ht 64.0 in | Wt 162.0 lb

## 2023-05-09 DIAGNOSIS — I952 Hypotension due to drugs: Secondary | ICD-10-CM | POA: Diagnosis not present

## 2023-05-09 DIAGNOSIS — E785 Hyperlipidemia, unspecified: Secondary | ICD-10-CM | POA: Diagnosis not present

## 2023-05-09 DIAGNOSIS — I428 Other cardiomyopathies: Secondary | ICD-10-CM | POA: Diagnosis not present

## 2023-05-09 DIAGNOSIS — I10 Essential (primary) hypertension: Secondary | ICD-10-CM | POA: Insufficient documentation

## 2023-05-09 NOTE — Progress Notes (Signed)
Office Visit    Patient Name: [name] Date of Encounter: 05/09/2023  PCP:  Fatima Sanger, FNP   [facility]  Cardiologist:  Orbie Pyo, MD  Advanced Practice Provider:  No care team member to display Electrophysiologist:  None   HPI    [name] is a 72 y.o. female who has a past medical history of anxiety, AVNRT, mitral valve prolapse, CHF, GERD, CAC score of 0, most recent LVEF 35 to 40% (07/11/2022) presents today for follow-up visit.  Pharm.D. saw the patient 06/02/2022 and the patient's blood pressure was low at 98/60.  Patient did report dizziness with losartan being started back in August.  Dizziness occurred when she would stand for long periods of time, not when she change positions.  Medication were not further titrated to dizzy.  Seen last by Pharm.D. 09/06/2022 patient spironolactone 12.5 mg daily was started and her potassium supplement was discontinued.  Restarted on Jardiance 10 mg (stopped due to history of recurrent UTIs).  Follow-up labs were WNL.  Patient did not need to take her Lasix for a month with no signs of volume overload.  Weight at that time was 138.6 pounds.  She was seen by me 01/2023, she tells me that her weight has slowly gone up over the last few weeks.  She states typically she stays around 149 pounds but here recently she has been over 150.  We reviewed her last echocardiogram back in September and she feels like there has been a difference since then.  We discussed ordering another echocardiogram today for further evaluation.  We also discussed stopping her losartan since her blood pressure is low normal today.  She occasionally does have some dizziness in the mornings but it usually resolves.  Blood pressure log showed SBP anywhere from 80-120s.  Heart rate has been well-controlled.  She also tells me that she weaned herself off of Prozac over 2 months time.  She contacted our office 7/11 to report weight  gain, fluid retention, and hypotension.  SBP 72-1 11, heart rate 80s to 90s.  Reported she was taking metoprolol XL 37.5 mg daily, Jardiance 10 mg daily and spironolactone 25 mg daily, no longer taking losartan.  Advised to hold spironolactone and Toprol all doses on that day and reduce Toprol well the following day to half a tablet (12.5 mg).  Advised to take Lasix 40 mg twice daily for 3 days and then was scheduled for an appointment.  She resume spironolactone 12.5 mg daily.  Seen by multiple providers at heart care and concern that our office that her PCP do not share information about dosages of medication.  Mild dizziness with low blood pressure readings.  Noted pressure in her legs and increased leg swelling.  She eats ice cream frequently and drinks more 2% milk and water.  Also eats out frequently.  She was advised to hold her Lasix if SBP less than 100 mmHg.  Today, she is still having issues with some low blood pressures.  We stopped her losartan when she saw me last time.  She also stopped her Prozac.  She continues to train with a trainer without symptoms.  She is not sleeping well but this has been an issue for a while.  She has gained 2 pounds in the last week.  Not fluid overloaded on exam.  States she never goes over 2g of salt a day.  Eating some ice cream from time to time.  She tells me she has gained 10 pounds in the last 3 months.  Feels like her legs are bigger in circumference.  Still dealing with nighttime tingling in her fingers but improves with changing position.  Has a history of hypoglycemia but not hyperglycemia.  Blood pressure stable today on exam however, blood pressures from home are reading anywhere from 80-120 systolic.  She wants to stop Jardiance to see if this would make a difference.  Will plan to repeat BMP and BNP August 12.  Describes a "pulsating pain" in the center of her sternum slightly to the left that occurs occasionally at night.  Not with activity.  Not  reproducible by palpation.  Reports no shortness of breath nor dyspnea on exertion. No orthopnea, PND. Reports no palpitations.    Past Medical History    Past Medical History:  Diagnosis Date   Adenomatous polyp    Allergic rhinitis    Anxiety    AV Nodal Reentry Tachycardia    s/p RFCA GT 2/15   AVNRT (AV nodal re-entry tachycardia) 11/06/2013   Calcified granuloma of lung (HCC)    Cataract    CHF (congestive heart failure) (HCC)    Diverticulosis 01/2020   Gastritis    GERD (gastroesophageal reflux disease)    H/O radiofrequency ablation for complex left atrial arrhythmia 12/12/2013   Headache    Hematuria    Hemorrhoids    HTN (hypertension)    Hyperlipemia    Hypothyroidism    Insomnia    Iron deficiency    Irregular heart beat    Laryngopharyngeal reflux (LPR)    Migraine    Mitral valve prolapse    ?   Rectal leakage    1 time   Retinal tear of both eyes    Skin lesion of breast    Vitamin D deficiency    Vulvodynia    Past Surgical History:  Procedure Laterality Date   ABLATION  11/21/2013   RFCA of AVNRT by Dr Ladona Ridgel   BREAST LUMPECTOMY Left 2000   left breast-   CATARCT     RIGHT EYE IN 09/2015   COLONOSCOPY  2007, 2008   RETINAL DETACHMENT SURGERY     RIGHT EYE/SILICONE BUCKLE   RHINOPLASTY  1980   RIGHT HEART CATH N/A 04/07/2022   Procedure: RIGHT HEART CATH;  Surgeon: Orbie Pyo, MD;  Location: [facility] INVASIVE CV LAB;  Service: Cardiovascular;  Laterality: N/A;   SEPTOPLASTY     1980   skin lesions     left leg,right leg   SUPRAVENTRICULAR TACHYCARDIA ABLATION N/A 11/21/2013   Procedure: SUPRAVENTRICULAR TACHYCARDIA ABLATION;  Surgeon: Marinus Maw, MD;  Location: [facility] CATH LAB;  Service: Cardiovascular;  Laterality: N/A;    Allergies  Allergies  Allergen Reactions   Crestor [Rosuvastatin Calcium] Other (See Comments)    Muscle aches   Pravastatin Other (See Comments)    Muscle aches    Propranolol Other (See Comments)    Caused  severe weakness    EKGs/Labs/Other Studies Reviewed:   The following studies were reviewed today:  Echo 07/11/22   IMPRESSIONS     1. Left ventricular ejection fraction, by estimation, is 35 to 40%. Left  ventricular ejection fraction by 3D volume is 38 %. The left ventricle has  moderately decreased function. The left ventricle demonstrates global  hypokinesis. There is mild  asymmetric left ventricular hypertrophy of the septal segment. Left  ventricular diastolic parameters are consistent with Grade I diastolic  dysfunction (impaired relaxation).   2. Right ventricular systolic function is normal. The right ventricular  size is normal. There is normal pulmonary artery systolic pressure.   3. A small pericardial effusion is present. The pericardial effusion is  circumferential.   4. The mitral valve is grossly normal. Mild to moderate mitral valve  regurgitation. The mean mitral valve gradient is 1.4 mmHg with average  heart rate of 63 bpm.   5. The aortic valve is tricuspid. Aortic valve regurgitation is not  visualized. Aortic valve sclerosis is present, with no evidence of aortic  valve stenosis.   6. The inferior vena cava is normal in size with greater than 50%  respiratory variability, suggesting right atrial pressure of 3 mmHg.   Comparison(s): No significant change from prior study.   FINDINGS   Left Ventricle: Left ventricular ejection fraction, by estimation, is 35  to 40%. Left ventricular ejection fraction by 3D volume is 38 %. The left  ventricle has moderately decreased function. The left ventricle  demonstrates global hypokinesis. The left  ventricular internal cavity size was normal in size. There is mild  asymmetric left ventricular hypertrophy of the septal segment. Left  ventricular diastolic parameters are consistent with Grade I diastolic  dysfunction (impaired relaxation).   Right Ventricle: The right ventricular size is normal. No increase in   right ventricular wall thickness. Right ventricular systolic function is  normal. There is normal pulmonary artery systolic pressure. The tricuspid  regurgitant velocity is 1.31 m/s, and   with an assumed right atrial pressure of 3 mmHg, the estimated right  ventricular systolic pressure is 9.9 mmHg.   Left Atrium: Left atrial size was normal in size.   Right Atrium: Right atrial size was normal in size.   Pericardium: A small pericardial effusion is present. The pericardial  effusion is circumferential.   Mitral Valve: Mitral Valve Area (MVA) = 2.16 cm2, 1.25 cm2 by 2D  planimetry. The mitral valve is grossly normal. Mild to moderate mitral  valve regurgitation. The mean mitral valve gradient is 1.4 mmHg with  average heart rate of 63 bpm.   Tricuspid Valve: The tricuspid valve is normal in structure. Tricuspid  valve regurgitation is not demonstrated. No evidence of tricuspid  stenosis.   Aortic Valve: The aortic valve is tricuspid. Aortic valve regurgitation is  not visualized. Aortic valve sclerosis is present, with no evidence of  aortic valve stenosis.   Pulmonic Valve: The pulmonic valve was normal in structure. Pulmonic valve  regurgitation is not visualized. No evidence of pulmonic stenosis.   Aorta: The aortic root, ascending aorta and aortic arch are all  structurally normal, with no evidence of dilitation or obstruction.   Venous: The inferior vena cava is normal in size with greater than 50%  respiratory variability, suggesting right atrial pressure of 3 mmHg.   IAS/Shunts: No atrial level shunt detected by color flow Doppler.   EKG:  EKG is  ordered today.  The ekg ordered today demonstrates normal sinus rhythm, rate 74 bpm  Recent Labs: 05/01/2023: BUN 28; Creatinine, Ser 1.10; NT-Pro BNP 56; Potassium 4.4; Sodium 139  Recent Lipid Panel    Component Value Date/Time   CHOL 290 (H) 01/18/2012 0910   TRIG 127.0 01/18/2012 0910   HDL 54.40 01/18/2012 0910    CHOLHDL 5 01/18/2012 0910   VLDL 25.4 01/18/2012 0910   LDLDIRECT 209.4 01/18/2012 0910     Home Medications   Current Meds  Medication Sig   Cholecalciferol (VITAMIN  D3) 50 MCG (2000 UT) capsule Take 2,000 Units by mouth daily.   ciclopirox (LOPROX) 0.77 % cream Apply 1 application. topically 2 (two) times daily as needed (foot fungus).   clotrimazole (LOTRIMIN) 1 % cream as needed.   empagliflozin (JARDIANCE) 10 MG TABS tablet Take 1 tablet (10 mg total) by mouth daily before breakfast.   furosemide (LASIX) 40 MG tablet Take 1 tablet (40 mg total) by mouth 2 (two) times daily. If systolic ( top number) of blood pressure is 100 or lower hold lasix till the systolic goes above 100.   metoprolol succinate (TOPROL-XL) 25 MG 24 hr tablet Take 12.5 mg by mouth daily. Pt takes 1/2 tablet once a day.   Multiple Vitamins-Minerals (MULTIVITAMINS THER. W/MINERALS) TABS tablet Take 1 tablet by mouth daily.   spironolactone (ALDACTONE) 25 MG tablet Take 12.5 mg by mouth daily. Pt takes 1/2 tablet once a day.   TIROSINT 37.5 MCG CAPS Take 1 capsule by mouth every morning.     Review of Systems      All other systems reviewed and are otherwise negative except as noted above.  Physical Exam    VS:  BP 112/64   Pulse 85   Ht 5\' 4"  (1.626 m)   Wt 162 lb (73.5 kg)   SpO2 97%   BMI 27.81 kg/m  , BMI Body mass index is 27.81 kg/m.  Wt Readings from Last 3 Encounters:  05/09/23 162 lb (73.5 kg)  05/02/23 160 lb 9.6 oz (72.8 kg)  02/06/23 153 lb 3.2 oz (69.5 kg)     GEN: Well nourished, well developed, in no acute distress. HEENT: normal. Neck: Supple, no JVD, carotid bruits, or masses. Cardiac: RRR, no murmurs, rubs, or gallops. No clubbing, cyanosis, edema.  Radials/PT 2+ and equal bilaterally.  Respiratory:  Respirations regular and unlabored, clear to auscultation bilaterally. GI: Soft, nontender, nondistended. MS: No deformity or atrophy. Skin: Warm and dry, no rash. Neuro:   Strength and sensation are intact. Psych: Normal affect.  Assessment & Plan    Nonischemic cardiomyopathy/chronic systolic HF -Update echocardiogram in Oct -Continue current medications including Lasix 40 mg daily with an extra 20 mg as needed for lower extremity edema, metoprolol succinate 12.5 mg daily spironolactone to 12.5 mg daily, stop Jardiance  -Maintain low-sodium, heart healthy diet -Please track your weight daily and record -renal function normal when checked 7/15 -update BNP and BMP in a few weeks after med change  Hyperlipidemia -She will be due for lipid panel next time she comes in   History of radiofrequency ablation for complex left atrial arrhythmia -she continued to have occasional palpitations but improved on metoprolol        Disposition: Follow up next available with Orbie Pyo, MD or APP.  Signed, Sharlene Dory, PA-C 05/09/2023, 5:21 PM  [facility]

## 2023-05-09 NOTE — Patient Instructions (Signed)
Medication Instructions:    Your physician recommends that you continue on your current medications as directed. Please refer to the Current Medication list given to you today.  *If you need a refill on your cardiac medications before your next appointment, please call your pharmacy*   Lab Work:  RETURN FOR LABS ON  AUG 12TH  BMET AND BNP    If you have labs (blood work) drawn today and your tests are completely normal, you will receive your results only by: MyChart Message (if you have MyChart) OR A paper copy in the mail If you have any lab test that is abnormal or we need to change your treatment, we will call you to review the results.  PROCEDURES: IN OCTOBER  Your physician has requested that you have an echocardiogram. Echocardiography is a painless test that uses sound waves to create images of your heart. It provides your doctor with information about the size and shape of your heart and how well your heart's chambers and valves are working. This procedure takes approximately one hour. There are no restrictions for this procedure. Please do NOT wear cologne, perfume, aftershave, or lotions (deodorant is allowed). Please arrive 15 minutes prior to your appointment time.   Follow-Up: At [facility], you and your health needs are our priority.  As part of our continuing mission to provide you with exceptional heart care, we have created designated Provider Care Teams.  These Care Teams include your primary Cardiologist (physician) and Advanced Practice Providers (APPs -  Physician Assistants and Nurse Practitioners) who all work together to provide you with the care you need, when you need it.  We recommend signing up for the patient portal called "MyChart".  Sign up information is provided on this After Visit Summary.  MyChart is used to connect with patients for Virtual Visits (Telemedicine).  Patients are able to view lab/test results, encounter notes, upcoming appointments,  etc.  Non-urgent messages can be sent to your provider as well.   To learn more about what you can do with MyChart, go to [email or web address].    Your next appointment:  NEXT AVAILABLE   Provider:    Orbie Pyo, MD     Other Instructions

## 2023-05-10 ENCOUNTER — Other Ambulatory Visit: Payer: Self-pay | Admitting: Physician Assistant

## 2023-05-10 DIAGNOSIS — I10 Essential (primary) hypertension: Secondary | ICD-10-CM

## 2023-05-10 DIAGNOSIS — R030 Elevated blood-pressure reading, without diagnosis of hypertension: Secondary | ICD-10-CM

## 2023-05-10 NOTE — Progress Notes (Signed)
24 hours BP monitor ordered.   Sharlene Dory, PA-C

## 2023-05-11 DIAGNOSIS — N183 Chronic kidney disease, stage 3 unspecified: Secondary | ICD-10-CM | POA: Diagnosis not present

## 2023-05-11 DIAGNOSIS — N3944 Nocturnal enuresis: Secondary | ICD-10-CM | POA: Diagnosis not present

## 2023-05-26 ENCOUNTER — Other Ambulatory Visit: Payer: Self-pay | Admitting: Internal Medicine

## 2023-05-29 DIAGNOSIS — I428 Other cardiomyopathies: Secondary | ICD-10-CM | POA: Diagnosis not present

## 2023-06-14 ENCOUNTER — Ambulatory Visit: Payer: Medicare Other | Attending: Physician Assistant

## 2023-06-14 DIAGNOSIS — R030 Elevated blood-pressure reading, without diagnosis of hypertension: Secondary | ICD-10-CM

## 2023-06-14 DIAGNOSIS — I10 Essential (primary) hypertension: Secondary | ICD-10-CM | POA: Diagnosis not present

## 2023-06-14 NOTE — Progress Notes (Unsigned)
24 hour ambulatory blood pressure monitor applied to patients left arm using standard adult cuff.  Dr. Lynnette Caffey to read.

## 2023-07-03 DIAGNOSIS — Z23 Encounter for immunization: Secondary | ICD-10-CM | POA: Diagnosis not present

## 2023-07-13 ENCOUNTER — Telehealth: Payer: Self-pay | Admitting: Internal Medicine

## 2023-07-13 ENCOUNTER — Other Ambulatory Visit: Payer: Self-pay

## 2023-07-13 DIAGNOSIS — R001 Bradycardia, unspecified: Secondary | ICD-10-CM

## 2023-07-13 DIAGNOSIS — I10 Essential (primary) hypertension: Secondary | ICD-10-CM

## 2023-07-13 DIAGNOSIS — Z79899 Other long term (current) drug therapy: Secondary | ICD-10-CM

## 2023-07-13 NOTE — Telephone Encounter (Signed)
Patient calling back with questions about changing her medciations and repeating labs.   Explained that  Jari Favre PA-C is advising that patient stop spironolactone, jardiance and lasix for the time being, but continue taking your toprol. Iadvised patient to continue to check blood pressure daily so we can get an idea of what happens with your blood pressure without the spironolactone, jardiance and lasix. Julian Hy also recommends that patient drink enough water and add some extra salt to diet.  Lab work orders (for a basic metabolic panel) have been sent over to [facility] so that they can be accessed at patient's preferred [facility] Location.  Patient also updated via Mychart per her request.

## 2023-07-13 NOTE — Telephone Encounter (Signed)
Patient was returning phone call 

## 2023-07-13 NOTE — Telephone Encounter (Signed)
Spoke with pt. Her BP have been running low the past few days and she has been feeling very fatigued. Pt stated she felt she had been drinking enough water. Told pt to hold todays lasix unless she started to swell and I would speak to Jari Favre who is in office today. Took concerns and BP's to [facility] who advised to stop spironolactone, jardiance and lasix for the time being. Increase water intake and add some extra salt to diet. BMP sometime in the next week or so.   Called pt back and left detailed message as instructed. Told pt to call back or MyChart if there were questions and to set lab appointment.  BMP order placed.

## 2023-07-13 NOTE — Telephone Encounter (Signed)
Duplicate phone encounter.

## 2023-07-13 NOTE — Addendum Note (Signed)
Addended by: Luellen Pucker on: 07/13/2023 02:02 PM   Modules accepted: Orders

## 2023-07-13 NOTE — Telephone Encounter (Signed)
Pt c/o BP issue: STAT if pt c/o blurred vision, one-sided weakness or slurred speech  1. What are your last 5 BP readings? 77/57, 88/63, 80/61, 91/62  2. Are you having any other symptoms (ex. Dizziness, headache, blurred vision, passed out)? Fatigued   3. What is your BP issue? Pt states she is having low BP and it is causing her to feel fatigued. Please advise

## 2023-07-20 DIAGNOSIS — Z79899 Other long term (current) drug therapy: Secondary | ICD-10-CM | POA: Diagnosis not present

## 2023-07-20 DIAGNOSIS — R001 Bradycardia, unspecified: Secondary | ICD-10-CM | POA: Diagnosis not present

## 2023-07-21 LAB — BASIC METABOLIC PANEL
BUN/Creatinine Ratio: 19 (ref 12–28)
BUN: 20 mg/dL (ref 8–27)
CO2: 22 mmol/L (ref 20–29)
Calcium: 9.1 mg/dL (ref 8.7–10.3)
Chloride: 105 mmol/L (ref 96–106)
Creatinine, Ser: 1.03 mg/dL — ABNORMAL HIGH (ref 0.57–1.00)
Glucose: 89 mg/dL (ref 70–99)
Potassium: 3.8 mmol/L (ref 3.5–5.2)
Sodium: 142 mmol/L (ref 134–144)
eGFR: 58 mL/min/{1.73_m2} — ABNORMAL LOW (ref >59)

## 2023-08-02 ENCOUNTER — Ambulatory Visit (HOSPITAL_COMMUNITY): Payer: Medicare Other | Attending: Internal Medicine

## 2023-08-02 DIAGNOSIS — I428 Other cardiomyopathies: Secondary | ICD-10-CM | POA: Insufficient documentation

## 2023-08-02 LAB — ECHOCARDIOGRAM COMPLETE
Area-P 1/2: 2.91 cm2
S' Lateral: 3.4 cm

## 2023-08-05 DIAGNOSIS — Z23 Encounter for immunization: Secondary | ICD-10-CM | POA: Diagnosis not present

## 2023-08-14 DIAGNOSIS — D1801 Hemangioma of skin and subcutaneous tissue: Secondary | ICD-10-CM | POA: Diagnosis not present

## 2023-08-14 DIAGNOSIS — D2262 Melanocytic nevi of left upper limb, including shoulder: Secondary | ICD-10-CM | POA: Diagnosis not present

## 2023-08-14 DIAGNOSIS — L821 Other seborrheic keratosis: Secondary | ICD-10-CM | POA: Diagnosis not present

## 2023-08-14 DIAGNOSIS — D485 Neoplasm of uncertain behavior of skin: Secondary | ICD-10-CM | POA: Diagnosis not present

## 2023-08-14 DIAGNOSIS — B351 Tinea unguium: Secondary | ICD-10-CM | POA: Diagnosis not present

## 2023-08-14 DIAGNOSIS — L814 Other melanin hyperpigmentation: Secondary | ICD-10-CM | POA: Diagnosis not present

## 2023-08-14 DIAGNOSIS — D225 Melanocytic nevi of trunk: Secondary | ICD-10-CM | POA: Diagnosis not present

## 2023-08-14 DIAGNOSIS — B353 Tinea pedis: Secondary | ICD-10-CM | POA: Diagnosis not present

## 2023-08-17 DIAGNOSIS — H35371 Puckering of macula, right eye: Secondary | ICD-10-CM | POA: Diagnosis not present

## 2023-09-13 ENCOUNTER — Telehealth: Payer: Self-pay | Admitting: Internal Medicine

## 2023-09-13 NOTE — Telephone Encounter (Signed)
Patient reports she has not been taking Jardiance or spironolactone since our office advised her to hold in in September due to low BP.  Patient reports BP: 11/26--82/54 and 84/67 in the morning, 122/79, HR 77 in the afternoon after walking 11/27--86/69, HR 90 this morning then 105/75,  HR 107 this afternoon  Patient denies any signs of bleeding, denies any SOB or dizziness. She does report one episode where she felt faint yesterday when checking her BP, but this quickly subsided and has not reoccurred. She states she feels tired and "weary" all day.  Patient taking Lasix 40mg  once daily, holding if systolic BP is less than 100. Patient states weight has been fluctuating between 162 lbs and 159 lbs today. Baseline weight is 145 lbs.  Patient states she called her PCP and her pharmacist today and was directed to call our office for further management.  Advised patient to continue holding spironolactone and Jardiance, and hold dose of Lasix if SBP is less than 100. Encouraged visit to urgent care for evaluation and EKG. Patient states she will continue to monitor and wait to see what Tessa recommends. Advised patient on ED precautions and on-call provider available. Patient verbalized understanding.

## 2023-09-13 NOTE — Telephone Encounter (Signed)
Pt c/o BP issue: STAT if pt c/o blurred vision, one-sided weakness or slurred speech  1. What are your last 5 BP readings? 89/64  2. Are you having any other symptoms (ex. Dizziness, headache, blurred vision, passed out)? Very tired, dizziness  3. What is your BP issue? Patient is experiencing low hypotension

## 2023-09-18 NOTE — Telephone Encounter (Signed)
Spoke with pt and advised per Tessa to increase water intake to 64oz daily.  Pt continues to take Furosemide as prescribed.  BP WNL this morning.  Advised to continue to monitor BP and HR.  Pt may send 5 days of readings via MyChart once she has increased fluids.  Encouraged to take BP about the same time each morning.  Contact office for any CP, SOB, dizziness or continued low BP.  Pt verbalizes understanding and agrees with current plan.

## 2023-09-21 DIAGNOSIS — E559 Vitamin D deficiency, unspecified: Secondary | ICD-10-CM | POA: Diagnosis not present

## 2023-09-21 DIAGNOSIS — E039 Hypothyroidism, unspecified: Secondary | ICD-10-CM | POA: Diagnosis not present

## 2023-09-21 DIAGNOSIS — I10 Essential (primary) hypertension: Secondary | ICD-10-CM | POA: Diagnosis not present

## 2023-11-09 DIAGNOSIS — E039 Hypothyroidism, unspecified: Secondary | ICD-10-CM | POA: Diagnosis not present

## 2023-11-09 DIAGNOSIS — N182 Chronic kidney disease, stage 2 (mild): Secondary | ICD-10-CM | POA: Diagnosis not present

## 2023-11-09 DIAGNOSIS — E559 Vitamin D deficiency, unspecified: Secondary | ICD-10-CM | POA: Diagnosis not present

## 2023-11-09 DIAGNOSIS — E7800 Pure hypercholesterolemia, unspecified: Secondary | ICD-10-CM | POA: Diagnosis not present

## 2023-11-10 LAB — LAB REPORT - SCANNED: EGFR: 63

## 2023-11-16 ENCOUNTER — Encounter: Payer: Self-pay | Admitting: Registered Nurse

## 2023-11-16 DIAGNOSIS — Z0000 Encounter for general adult medical examination without abnormal findings: Secondary | ICD-10-CM | POA: Diagnosis not present

## 2023-11-16 DIAGNOSIS — R748 Abnormal levels of other serum enzymes: Secondary | ICD-10-CM | POA: Diagnosis not present

## 2023-11-16 DIAGNOSIS — I428 Other cardiomyopathies: Secondary | ICD-10-CM | POA: Diagnosis not present

## 2023-11-16 DIAGNOSIS — E039 Hypothyroidism, unspecified: Secondary | ICD-10-CM | POA: Diagnosis not present

## 2023-11-16 DIAGNOSIS — I959 Hypotension, unspecified: Secondary | ICD-10-CM | POA: Diagnosis not present

## 2023-11-24 ENCOUNTER — Other Ambulatory Visit: Payer: Self-pay

## 2023-11-24 ENCOUNTER — Other Ambulatory Visit: Payer: Self-pay | Admitting: Physician Assistant

## 2023-11-24 ENCOUNTER — Ambulatory Visit: Payer: Medicare Other | Attending: Physician Assistant | Admitting: Physician Assistant

## 2023-11-24 ENCOUNTER — Telehealth: Payer: Self-pay | Admitting: Physician Assistant

## 2023-11-24 ENCOUNTER — Encounter: Payer: Self-pay | Admitting: Physician Assistant

## 2023-11-24 VITALS — BP 122/84 | HR 106 | Resp 17 | Ht 64.0 in | Wt 166.0 lb

## 2023-11-24 DIAGNOSIS — R0609 Other forms of dyspnea: Secondary | ICD-10-CM | POA: Diagnosis not present

## 2023-11-24 DIAGNOSIS — I428 Other cardiomyopathies: Secondary | ICD-10-CM | POA: Diagnosis not present

## 2023-11-24 DIAGNOSIS — E785 Hyperlipidemia, unspecified: Secondary | ICD-10-CM | POA: Insufficient documentation

## 2023-11-24 DIAGNOSIS — Z79899 Other long term (current) drug therapy: Secondary | ICD-10-CM | POA: Insufficient documentation

## 2023-11-24 MED ORDER — NEXLIZET 180-10 MG PO TABS: 1.0000 | ORAL_TABLET | Freq: Every day | ORAL | 3 refills | Status: AC

## 2023-11-24 MED ORDER — METOPROLOL SUCCINATE ER 25 MG PO TB24: 25.0000 mg | ORAL_TABLET | Freq: Every day | ORAL | 3 refills | Status: AC

## 2023-11-24 NOTE — Progress Notes (Signed)
 Office Visit    Patient Name: [name] Date of Encounter: 11/24/2023  PCP:  [name] Ronal Czar, FNP   [facility]  Cardiologist:  [name] MARLA Red, MD  Advanced Practice Provider:  No care team member to display Electrophysiologist:  None   HPI    [name] is a 73 y.o. female who has a past medical history of anxiety, AVNRT, mitral valve prolapse, CHF, GERD, CAC score of 0, most recent LVEF 35 to 40% (07/11/2022) presents today for follow-up visit.  Pharm.D. saw the patient 06/02/2022 and the patient's blood pressure was low at 98/60.  Patient did report dizziness with losartan  being started back in August.  Dizziness occurred when she would stand for long periods of time, not when she change positions.  Medication were not further titrated to dizzy.  Seen last by Pharm.D. 09/06/2022 patient spironolactone  12.5 mg daily was started and her potassium supplement was discontinued.  Restarted on Jardiance  10 mg (stopped due to history of recurrent UTIs).  Follow-up labs were WNL.  Patient did not need to take her Lasix  for a month with no signs of volume overload.  Weight at that time was 138.6 pounds.  She was seen by me 01/2023, she tells me that her weight has slowly gone up over the last few weeks.  She states typically she stays around 149 pounds but here recently she has been over 150.  We reviewed her last echocardiogram back in September and she feels like there has been a difference since then.  We discussed ordering another echocardiogram today for further evaluation.  We also discussed stopping her losartan  since her blood pressure is low normal today.  She occasionally does have some dizziness in the mornings but it usually resolves.  Blood pressure log showed SBP anywhere from 80-120s.  Heart rate has been well-controlled.  She also tells me that she weaned herself off of Prozac  over 2 months time.  She contacted our office 7/11 to report weight gain,  fluid retention, and hypotension.  SBP 72-1 11, heart rate 80s to 90s.  Reported she was taking metoprolol  XL 37.5 mg daily, Jardiance  10 mg daily and spironolactone  25 mg daily, no longer taking losartan .  Advised to hold spironolactone  and Toprol  all doses on that day and reduce Toprol  well the following day to half a tablet (12.5 mg).  Advised to take Lasix  40 mg twice daily for 3 days and then was scheduled for an appointment.  She resume spironolactone  12.5 mg daily.  Seen by multiple providers at heart care and concern that our office that her PCP do not share information about dosages of medication.  Mild dizziness with low blood pressure readings.  Noted pressure in her legs and increased leg swelling.  She eats ice cream frequently and drinks more 2% milk and water.  Also eats out frequently.  She was advised to hold her Lasix  if SBP less than 100 mmHg.  She saw me 05/10/23, she is still having issues with some low blood pressures.  We stopped her losartan  when she saw me last time.  She also stopped her Prozac .  She continues to train with a trainer without symptoms.  She is not sleeping well but this has been an issue for a while.  She has gained 2 pounds in the last week.  Not fluid overloaded on exam.  States she never goes over 2g of salt a day.  Eating some ice cream from  time to time.  She tells me she has gained 10 pounds in the last 3 months.  Feels like her legs are bigger in circumference.  Still dealing with nighttime tingling in her fingers but improves with changing position.  Has a history of hypoglycemia but not hyperglycemia.  Blood pressure stable today on exam however, blood pressures from home are reading anywhere from 80-120 systolic.  She wants to stop Jardiance  to see if this would make a difference.  Will plan to repeat BMP and BNP August 12.  Describes a pulsating pain in the center of her sternum slightly to the left that occurs occasionally at night.  Not with activity.  Not  reproducible by palpation.  Reports no shortness of breath nor dyspnea on exertion. No orthopnea, PND. Reports no palpitations.   Today, she presents with a history of high cholesterol and congestive heart failure, with concerns about her high LDL levels, weight gain, hypotension and increasing fatigue. She reports a family history of sudden heart attacks, which adds to her anxiety about her health. The patient has been experiencing low blood pressure in the mornings and a fast heart rate. She is currently on half a tablet of metoprolol  and has been hesitant about starting statin therapy due to potential side effects. The patient also mentions a decrease in exercise tolerance and difficulty sleeping, which she attributes to her current health issues. She expresses a desire for a more aggressive approach to her health management  Reports no shortness of breath nor dyspnea on exertion. Reports no chest pain, pressure, or tightness. No edema, orthopnea, PND. Reports no palpitations.   Discussed the use of AI scribe software for clinical note transcription with the patient, who gave verbal consent to proceed.  Past Medical History    Past Medical History:  Diagnosis Date   Adenomatous polyp    Allergic rhinitis    Anxiety    AV Nodal Reentry Tachycardia    s/p RFCA GT 2/15   AVNRT (AV nodal re-entry tachycardia) (HCC) 11/06/2013   Calcified granuloma of lung    Cataract    CHF (congestive heart failure) (HCC)    Diverticulosis 01/2020   Gastritis    GERD (gastroesophageal reflux disease)    H/O radiofrequency ablation for complex left atrial arrhythmia 12/12/2013   Headache    Hematuria    Hemorrhoids    HTN (hypertension)    Hyperlipemia    Hypothyroidism    Insomnia    Iron deficiency    Irregular heart beat    Laryngopharyngeal reflux (LPR)    Migraine    Mitral valve prolapse    ?   Rectal leakage    1 time   Retinal tear of both eyes    Skin lesion of breast    Vitamin D  deficiency    Vulvodynia    Past Surgical History:  Procedure Laterality Date   ABLATION  11/21/2013   RFCA of AVNRT by Dr Waddell   BREAST LUMPECTOMY Left 2000   left breast-   CATARCT     RIGHT EYE IN 09/2015   COLONOSCOPY  2007, 2008   RETINAL DETACHMENT SURGERY     RIGHT EYE/SILICONE BUCKLE   RHINOPLASTY  1980   RIGHT HEART CATH N/A 04/07/2022   Procedure: RIGHT HEART CATH;  Surgeon: Wendel [name] POUR, MD;  Location: [facility] INVASIVE CV LAB;  Service: Cardiovascular;  Laterality: N/A;   SEPTOPLASTY     1980   skin lesions  left leg,right leg   SUPRAVENTRICULAR TACHYCARDIA ABLATION N/A 11/21/2013   Procedure: SUPRAVENTRICULAR TACHYCARDIA ABLATION;  Surgeon: Danelle LELON Birmingham, MD;  Location: [facility] CATH LAB;  Service: Cardiovascular;  Laterality: N/A;    Allergies  Allergies  Allergen Reactions   Crestor  [Rosuvastatin  Calcium ] Other (See Comments)    Muscle aches   Pravastatin Other (See Comments)    Muscle aches    Propranolol  Other (See Comments)    Caused severe weakness    EKGs/Labs/Other Studies Reviewed:   The following studies were reviewed today:  Echo 07/11/22   IMPRESSIONS     1. Left ventricular ejection fraction, by estimation, is 35 to 40%. Left  ventricular ejection fraction by 3D volume is 38 %. The left ventricle has  moderately decreased function. The left ventricle demonstrates global  hypokinesis. There is mild  asymmetric left ventricular hypertrophy of the septal segment. Left  ventricular diastolic parameters are consistent with Grade I diastolic  dysfunction (impaired relaxation).   2. Right ventricular systolic function is normal. The right ventricular  size is normal. There is normal pulmonary artery systolic pressure.   3. A small pericardial effusion is present. The pericardial effusion is  circumferential.   4. The mitral valve is grossly normal. Mild to moderate mitral valve  regurgitation. The mean mitral valve gradient is 1.4 mmHg with  average  heart rate of 63 bpm.   5. The aortic valve is tricuspid. Aortic valve regurgitation is not  visualized. Aortic valve sclerosis is present, with no evidence of aortic  valve stenosis.   6. The inferior vena cava is normal in size with greater than 50%  respiratory variability, suggesting right atrial pressure of 3 mmHg.   Comparison(s): No significant change from prior study.   FINDINGS   Left Ventricle: Left ventricular ejection fraction, by estimation, is 35  to 40%. Left ventricular ejection fraction by 3D volume is 38 %. The left  ventricle has moderately decreased function. The left ventricle  demonstrates global hypokinesis. The left  ventricular internal cavity size was normal in size. There is mild  asymmetric left ventricular hypertrophy of the septal segment. Left  ventricular diastolic parameters are consistent with Grade I diastolic  dysfunction (impaired relaxation).   Right Ventricle: The right ventricular size is normal. No increase in  right ventricular wall thickness. Right ventricular systolic function is  normal. There is normal pulmonary artery systolic pressure. The tricuspid  regurgitant velocity is 1.31 m/s, and   with an assumed right atrial pressure of 3 mmHg, the estimated right  ventricular systolic pressure is 9.9 mmHg.   Left Atrium: Left atrial size was normal in size.   Right Atrium: Right atrial size was normal in size.   Pericardium: A small pericardial effusion is present. The pericardial  effusion is circumferential.   Mitral Valve: Mitral Valve Area (MVA) = 2.16 cm2, 1.25 cm2 by 2D  planimetry. The mitral valve is grossly normal. Mild to moderate mitral  valve regurgitation. The mean mitral valve gradient is 1.4 mmHg with  average heart rate of 63 bpm.   Tricuspid Valve: The tricuspid valve is normal in structure. Tricuspid  valve regurgitation is not demonstrated. No evidence of tricuspid  stenosis.   Aortic Valve: The aortic  valve is tricuspid. Aortic valve regurgitation is  not visualized. Aortic valve sclerosis is present, with no evidence of  aortic valve stenosis.   Pulmonic Valve: The pulmonic valve was normal in structure. Pulmonic valve  regurgitation is not visualized. No evidence  of pulmonic stenosis.   Aorta: The aortic root, ascending aorta and aortic arch are all  structurally normal, with no evidence of dilitation or obstruction.   Venous: The inferior vena cava is normal in size with greater than 50%  respiratory variability, suggesting right atrial pressure of 3 mmHg.   IAS/Shunts: No atrial level shunt detected by color flow Doppler.   EKG:  EKG is  ordered today.  The ekg ordered today demonstrates normal sinus rhythm, rate 74 bpm  Recent Labs: 05/29/2023: NT-Pro BNP 163 07/20/2023: BUN 20; Creatinine, Ser 1.03; Potassium 3.8; Sodium 142  Recent Lipid Panel    Component Value Date/Time   CHOL 290 (H) 01/18/2012 0910   TRIG 127.0 01/18/2012 0910   HDL 54.40 01/18/2012 0910   CHOLHDL 5 01/18/2012 0910   VLDL 25.4 01/18/2012 0910   LDLDIRECT 209.4 01/18/2012 0910     Home Medications   Current Meds  Medication Sig   Cholecalciferol (VITAMIN D3) 50 MCG (2000 UT) capsule Take 2,000 Units by mouth daily.   ciclopirox (LOPROX) 0.77 % cream Apply 1 application. topically 2 (two) times daily as needed (foot fungus).   clotrimazole (LOTRIMIN) 1 % cream as needed.   metoprolol  succinate (TOPROL -XL) 25 MG 24 hr tablet Take 12.5 mg by mouth daily. Pt takes 1/2 tablet once a day.   Multiple Vitamins-Minerals (MULTIVITAMINS THER. W/MINERALS) TABS tablet Take 1 tablet by mouth daily.   SYNTHROID  25 MCG tablet Take 25 mcg by mouth. As directed     Review of Systems      All other systems reviewed and are otherwise negative except as noted above.  Physical Exam    VS:  BP 122/84 (BP Location: Right Arm, Patient Position: Sitting, Cuff Size: Large)   Pulse (!) 106   Resp 17   Ht 5' 4  (1.626 m)   Wt 166 lb (75.3 kg)   SpO2 95%   BMI 28.49 kg/m  , BMI Body mass index is 28.49 kg/m.  Wt Readings from Last 3 Encounters:  11/24/23 166 lb (75.3 kg)  05/09/23 162 lb (73.5 kg)  05/02/23 160 lb 9.6 oz (72.8 kg)     GEN: Well nourished, well developed, in no acute distress. HEENT: normal. Neck: Supple, no JVD, carotid bruits, or masses. Cardiac: RRR, no murmurs, rubs, or gallops. No clubbing, cyanosis, edema.  Radials/PT 2+ and equal bilaterally.  Respiratory:  Respirations regular and unlabored, clear to auscultation bilaterally. GI: Soft, nontender, nondistended. MS: No deformity or atrophy. Skin: Warm and dry, no rash. Neuro:  Strength and sensation are intact. Psych: Normal affect.  Assessment & Plan    Hyperlipidemia LDL significantly elevated, family history of sudden cardiac death. Discussed options for non-statin lipid-lowering medications including Nexlizet , Repatha, and a twice-yearly injectable. Patient declined injectables. -Start Nexlizet  and recheck lipid panel in 8 weeks.  Tachycardia Heart rate elevated at 106 during visit. Currently on half a tablet of Metoprolol  daily. -Increase Metoprolol  to one full tablet daily. -Monitor blood pressure and heart rate.  Fatigue and Decreased Exercise Tolerance Patient reports significant fatigue and decreased ability to perform daily activities. No significant findings on recent echocardiogram. -Order repeat echocardiogram to reassess mitral valve and cardiac function. -Consider stress test if echocardiogram is normal and symptoms persist.  Insomnia Chronic issue, exacerbated recently. No current pharmacological management. -Address sleep hygiene and consider further evaluation if no improvement.   Disposition: Follow up next available with Arun K Thukkani, MD or APP.  Signed, Orren LOISE Fabry, PA-C  11/24/2023, 2:14 PM [facility]

## 2023-11-24 NOTE — Patient Instructions (Addendum)
 Medication Instructions:  Your physician has recommended you make the following change in your medication:  1) Start Nexlizet  180/10mg  once daily 2) Increase toprol  xl 25mg  daily  Lab Work: Lipids- in 8 weeks est 01/19/24  If you have labs (blood work) drawn today and your tests are completely normal, you will receive your results only by: MyChart Message (if you have MyChart) OR A paper copy in the mail If you have any lab test that is abnormal or we need to change your treatment, we will call you to review the results.  Testing/Procedures: Your physician has requested that you have an echocardiogram. Echocardiography is a painless test that uses sound waves to create images of your heart. It provides your doctor with information about the size and shape of your heart and how well your heart's chambers and valves are working. This procedure takes approximately one hour. There are no restrictions for this procedure. Please do NOT wear cologne, perfume, aftershave, or lotions (deodorant is allowed). Please arrive 15 minutes prior to your appointment time.  Please note: We ask at that you not bring children with you during ultrasound (echo/ vascular) testing. Due to room size and safety concerns, children are not allowed in the ultrasound rooms during exams. Our front office staff cannot provide observation of children in our lobby area while testing is being conducted. An adult accompanying a patient to their appointment will only be allowed in the ultrasound room at the discretion of the ultrasound technician under special circumstances. We apologize for any inconvenience.   Follow-Up: At [facility], you and your health needs are our priority.  As part of our continuing mission to provide you with exceptional heart care, we have created designated Provider Care Teams.  These Care Teams include your primary Cardiologist (physician) and Advanced Practice Providers (APPs -  Physician Assistants  and Nurse Practitioners) who all work together to provide you with the care you need, when you need it.  Referral to Cardiac rehab   Keep log of blood pressures and heart rate for the next 2 weeks and send those in to us . You may call or mychart message.   Important Information About Sugar

## 2023-11-24 NOTE — Telephone Encounter (Signed)
 Pt c/o medication issue:  1. Name of Medication: metoprolol  succinate (TOPROL -XL) 25 MG 24 hr tablet   2. How are you currently taking this medication (dosage and times per day)?   3. Are you having a reaction (difficulty breathing--STAT)? no  4. What is your medication issue? Pharmacy needs clarification os dosage

## 2023-11-24 NOTE — Telephone Encounter (Signed)
 Pt's pharmacy is requesting clarification for medication metoprolol . Does pt supposed to be taking 1 tablet daily or 1/2 tablet daily? Please address

## 2023-11-24 NOTE — Telephone Encounter (Signed)
 Spoke with Pharmacy and corrected dosage.

## 2023-11-28 ENCOUNTER — Other Ambulatory Visit: Payer: Self-pay

## 2023-12-04 ENCOUNTER — Other Ambulatory Visit (HOSPITAL_COMMUNITY): Payer: Self-pay | Admitting: *Deleted

## 2023-12-04 ENCOUNTER — Encounter: Payer: Self-pay | Admitting: Physician Assistant

## 2023-12-04 DIAGNOSIS — R0609 Other forms of dyspnea: Secondary | ICD-10-CM

## 2023-12-07 ENCOUNTER — Telehealth (HOSPITAL_COMMUNITY): Payer: Self-pay

## 2023-12-07 NOTE — Telephone Encounter (Signed)
 Pt insurance is active and benefits verified through Medicare A/B. Co-pay $0.00, DED $257.00/$257.00 met, out of pocket $0.00/$0.00 met, co-insurance 20%. No pre-authorization required. 12/07/23 @ 2:51PM   2ndary insurance is active and benefits verified through [employer]. Co-pay $0.00, DED $0.00/$0.00 met, out of pocket $0.00/$0.00 met, co-insurance 0%. No pre-authorization required. 12/07/23 @ 2:51PM

## 2023-12-07 NOTE — Telephone Encounter (Signed)
 Received referral from Dr. Lynnette Caffey for this pt to participate in Pulmonary Rehab with the diagnosis of Dyspnea on Exertion. Clinical review of pt follow up appt on 11/24/23 Cardiology office note. Pt appropriate for scheduling for Pulmonary rehab.  LVM to see if patient was interested in participating in the Pulmonary Rehab Program. Awaiting call back.    Essie Hart Cardiac and Pulmonary Rehab

## 2023-12-08 ENCOUNTER — Telehealth: Payer: Self-pay

## 2023-12-08 DIAGNOSIS — R0609 Other forms of dyspnea: Secondary | ICD-10-CM

## 2023-12-08 NOTE — Telephone Encounter (Signed)
 Order placed for pulmonary rehab with diagnosis of DOE per Dr Lynnette Caffey.

## 2023-12-08 NOTE — Telephone Encounter (Signed)
-----   Message from Orbie Pyo sent at 12/04/2023  5:33 PM EST ----- Regarding: RE: Pulmonary Rehab Sure and I am not sure how to cosign this, it is not in my inbox.  Michalene, can you make referral to pulmonary rehab for dyspnea. ----- Message ----- From: Chelsea Aus, RN Sent: 12/04/2023  12:15 PM EST To: Orbie Pyo, MD Subject: Pulmonary Rehab                                 Dr. Lynnette Caffey  The above pt recently completed follow up appt with Jari Favre PA on 2/7.  Talked about her decrease in exercise stamina and desire to be aggressive regarding her health management   Unfortunately she does not have a qualifying diagnosis of Cardiac rehab however we can use dyspnea on exertion for Pulmonary rehab.  If you agree, please cosign the referral that I have placed  Thank you for your support  Karlene Lineman RN, BSN Cardiac and Pulmonary Rehab Nurse Navigator

## 2023-12-11 ENCOUNTER — Telehealth (HOSPITAL_COMMUNITY): Payer: Self-pay | Admitting: *Deleted

## 2023-12-11 NOTE — Telephone Encounter (Signed)
 Called pt and discussed Pulmonary Rehab opportunity. She is interested. Scheduled pt for orientation 12/15/23 at 1 pm. She will begin exercise 12/21/23. Sent pt MyChart message regarding address and instructions for the program.  Ethelda Chick BS, ACSM-CEP 12/11/2023 10:27 AM

## 2023-12-14 ENCOUNTER — Ambulatory Visit (HOSPITAL_COMMUNITY): Payer: Medicare Other | Attending: Cardiovascular Disease

## 2023-12-14 DIAGNOSIS — R0609 Other forms of dyspnea: Secondary | ICD-10-CM | POA: Insufficient documentation

## 2023-12-14 LAB — ECHOCARDIOGRAM COMPLETE
Area-P 1/2: 3.61 cm2
S' Lateral: 2.85 cm

## 2023-12-15 ENCOUNTER — Encounter (HOSPITAL_COMMUNITY)
Admission: RE | Admit: 2023-12-15 | Discharge: 2023-12-15 | Disposition: A | Discharge status: Home / Self Care | Payer: Medicare Other | Source: Ambulatory Visit | Attending: Internal Medicine | Admitting: Internal Medicine

## 2023-12-15 ENCOUNTER — Encounter: Payer: Self-pay | Admitting: Physician Assistant

## 2023-12-15 VITALS — BP 122/80 | HR 79 | Ht 64.5 in | Wt 165.6 lb

## 2023-12-15 DIAGNOSIS — R0609 Other forms of dyspnea: Secondary | ICD-10-CM | POA: Diagnosis not present

## 2023-12-15 NOTE — Progress Notes (Signed)
 Pulmonary Individual Treatment Plan  Patient Details  Name: [name] MRN: [id number] Date of Birth: 04-Oct-1951 Referring Provider:   Doristine Devoid Pulmonary Rehab Walk Test from 12/15/2023 in [facility]  Referring Provider Thukkani       Initial Encounter Date:  Flowsheet Row Pulmonary Rehab Walk Test from 12/15/2023 in [facility]  Date 12/15/23       Visit Diagnosis: DOE (dyspnea on exertion)  Patient's Home Medications on Admission:   Current Outpatient Medications:    Bempedoic Acid-Ezetimibe (NEXLIZET) 180-10 MG TABS, Take 1 tablet by mouth at bedtime., Disp: 90 tablet, Rfl: 3   Cholecalciferol (VITAMIN D3) 50 MCG (2000 UT) capsule, Take 2,000 Units by mouth daily., Disp: , Rfl:    ciclopirox (LOPROX) 0.77 % cream, Apply 1 application. topically 2 (two) times daily as needed (foot fungus)., Disp: , Rfl:    clotrimazole (LOTRIMIN) 1 % cream, as needed., Disp: , Rfl:    metoprolol succinate (TOPROL-XL) 25 MG 24 hr tablet, Take 1 tablet (25 mg total) by mouth daily. Pt takes 1/2 tablet once a day., Disp: 90 tablet, Rfl: 3   Multiple Vitamins-Minerals (MULTIVITAMINS THER. W/MINERALS) TABS tablet, Take 1 tablet by mouth daily., Disp: , Rfl:    SYNTHROID 25 MCG tablet, Take 25 mcg by mouth. As directed, Disp: , Rfl:   Past Medical History: Past Medical History:  Diagnosis Date   Adenomatous polyp    Allergic rhinitis    Anxiety    AV Nodal Reentry Tachycardia    s/p RFCA GT 2/15   AVNRT (AV nodal re-entry tachycardia) (HCC) 11/06/2013   Calcified granuloma of lung    Cataract    CHF (congestive heart failure) (HCC)    Diverticulosis 01/2020   Gastritis    GERD (gastroesophageal reflux disease)    H/O radiofrequency ablation for complex left atrial arrhythmia 12/12/2013   Headache    Hematuria    Hemorrhoids    HTN (hypertension)    Hyperlipemia     Hypothyroidism    Insomnia    Iron deficiency    Irregular heart beat    Laryngopharyngeal reflux (LPR)    Migraine    Mitral valve prolapse    ?   Rectal leakage    1 time   Retinal tear of both eyes    Skin lesion of breast    Vitamin D deficiency    Vulvodynia     Tobacco Use: Social History   Tobacco Use  Smoking Status Never  Smokeless Tobacco Never    Labs: Review Flowsheet       Latest Ref Rng & Units 12/29/2010 01/18/2012 04/07/2022  Labs for ITP Cardiac and Pulmonary Rehab  Cholestrol 0 - 200 mg/dL - 782  -  Direct LDL mg/dL - 956.2  -  HDL-C >13.08 mg/dL - 65.78  -  Trlycerides 0.0 - 149.0 mg/dL - 469.6  -  Bicarbonate 20.0 - 28.0 mmol/L - - 24.6  25.2  25.4  25.7  24.0  25.6   TCO2 22 - 32 mmol/L 24  - 26  26  26  27  25  27    O2 Saturation % - - 63  63  66  58  70  57     Details       Multiple values from one day are sorted in reverse-chronological order  Capillary Blood Glucose: No results found for: "GLUCAP"   Pulmonary Assessment Scores:  Pulmonary Assessment Scores     Row Name 12/15/23 1307         ADL UCSD   ADL Phase Entry     SOB Score total 42       CAT Score   CAT Score 20       mMRC Score   mMRC Score 3             UCSD: Self-administered rating of dyspnea associated with activities of daily living (ADLs) 6-point scale (0 = "not at all" to 5 = "maximal or unable to do because of breathlessness")  Scoring Scores range from 0 to 120.  Minimally important difference is 5 units  CAT: CAT can identify the health impairment of COPD patients and is better correlated with disease progression.  CAT has a scoring range of zero to 40. The CAT score is classified into four groups of low (less than 10), medium (10 - 20), high (21-30) and very high (31-40) based on the impact level of disease on health status. A CAT score over 10 suggests significant symptoms.  A worsening CAT score could be explained by an exacerbation,  poor medication adherence, poor inhaler technique, or progression of COPD or comorbid conditions.  CAT MCID is 2 points  mMRC: mMRC (Modified Medical Research Council) Dyspnea Scale is used to assess the degree of baseline functional disability in patients of respiratory disease due to dyspnea. No minimal important difference is established. A decrease in score of 1 point or greater is considered a positive change.   Pulmonary Function Assessment:  Pulmonary Function Assessment - 12/15/23 1431       Breath   Bilateral Breath Sounds Clear    Shortness of Breath Fear of Shortness of Breath             Exercise Target Goals: Exercise Program Goal: Individual exercise prescription set using results from initial 6 min walk test and THRR while considering  patient's activity barriers and safety.   Exercise Prescription Goal: Initial exercise prescription builds to 30-45 minutes a day of aerobic activity, 2-3 days per week.  Home exercise guidelines will be given to patient during program as part of exercise prescription that the participant will acknowledge.  Activity Barriers & Risk Stratification:  Activity Barriers & Cardiac Risk Stratification - 12/15/23 1322       Activity Barriers & Cardiac Risk Stratification   Activity Barriers Deconditioning;Muscular Weakness;Shortness of Breath    Cardiac Risk Stratification Moderate             6 Minute Walk:  6 Minute Walk     Row Name 12/15/23 1418         6 Minute Walk   Phase Initial     Distance 1680 feet     Walk Time 6 minutes     # of Rest Breaks 0     MPH 3.18     METS 3.46     RPE 13     Perceived Dyspnea  1     VO2 Peak 12.11     Symptoms No     Resting HR 79 bpm     Resting BP 122/80     Resting Oxygen Saturation  98 %     Exercise Oxygen Saturation  during 6 min walk 96 %     Max Ex. HR 122 bpm     Max Ex. BP 164/82  2 Minute Post BP 132/80       Interval HR   1 Minute HR 104     2 Minute HR  114     3 Minute HR 117     4 Minute HR 118     5 Minute HR 121     6 Minute HR 122     2 Minute Post HR 87     Interval Heart Rate? Yes       Interval Oxygen   Interval Oxygen? Yes     Baseline Oxygen Saturation % 98 %     1 Minute Oxygen Saturation % 97 %     1 Minute Liters of Oxygen 0 L     2 Minute Oxygen Saturation % 97 %     2 Minute Liters of Oxygen 0 L     3 Minute Oxygen Saturation % 97 %     3 Minute Liters of Oxygen 0 L     4 Minute Oxygen Saturation % 98 %     4 Minute Liters of Oxygen 0 L     5 Minute Oxygen Saturation % 96 %     5 Minute Liters of Oxygen 0 L     6 Minute Oxygen Saturation % 96 %     6 Minute Liters of Oxygen 0 L     2 Minute Post Oxygen Saturation % 99 %     2 Minute Post Liters of Oxygen 0 L              Oxygen Initial Assessment:  Oxygen Initial Assessment - 12/15/23 1321       Home Oxygen   Home Oxygen Device None    Sleep Oxygen Prescription None    Home Exercise Oxygen Prescription None    Home Resting Oxygen Prescription None      Initial 6 min Walk   Oxygen Used None      Program Oxygen Prescription   Program Oxygen Prescription None      Intervention   Short Term Goals To learn and understand importance of maintaining oxygen saturations>88%;To learn and understand importance of monitoring SPO2 with pulse oximeter and demonstrate accurate use of the pulse oximeter.;To learn and demonstrate proper pursed lip breathing techniques or other breathing techniques.     Long  Term Goals Verbalizes importance of monitoring SPO2 with pulse oximeter and return demonstration;Maintenance of O2 saturations>88%;Exhibits proper breathing techniques, such as pursed lip breathing or other method taught during program session             Oxygen Re-Evaluation:   Oxygen Discharge (Final Oxygen Re-Evaluation):   Initial Exercise Prescription:  Initial Exercise Prescription - 12/15/23 1400       Date of Initial Exercise RX and  Referring Provider   Date 12/15/23    Referring Provider Thukkani    Expected Discharge Date 12/15/23      Treadmill   MPH 2.8    Grade 0    Minutes 15    METs 3.14      Elliptical   Level 1    Speed 1    Minutes 15    METs 3.14      Prescription Details   Frequency (times per week) 2    Duration Progress to 30 minutes of continuous aerobic without signs/symptoms of physical distress      Intensity   THRR 40-80% of Max Heartrate 60-119    Ratings of Perceived Exertion 11-13  Perceived Dyspnea 0-4      Progression   Progression Continue to progress workloads to maintain intensity without signs/symptoms of physical distress.      Resistance Training   Training Prescription Yes    Weight blue bands    Reps 10-15             Perform Capillary Blood Glucose checks as needed.  Exercise Prescription Changes:   Exercise Comments:   Exercise Goals and Review:   Exercise Goals     Row Name 12/15/23 1322             Exercise Goals   Increase Physical Activity Yes       Intervention Provide advice, education, support and counseling about physical activity/exercise needs.;Develop an individualized exercise prescription for aerobic and resistive training based on initial evaluation findings, risk stratification, comorbidities and participant's personal goals.       Expected Outcomes Short Term: Attend rehab on a regular basis to increase amount of physical activity.;Long Term: Add in home exercise to make exercise part of routine and to increase amount of physical activity.;Long Term: Exercising regularly at least 3-5 days a week.       Increase Strength and Stamina Yes       Intervention Provide advice, education, support and counseling about physical activity/exercise needs.;Develop an individualized exercise prescription for aerobic and resistive training based on initial evaluation findings, risk stratification, comorbidities and participant's personal goals.        Expected Outcomes Short Term: Increase workloads from initial exercise prescription for resistance, speed, and METs.;Short Term: Perform resistance training exercises routinely during rehab and add in resistance training at home;Long Term: Improve cardiorespiratory fitness, muscular endurance and strength as measured by increased METs and functional capacity ( )       Able to understand and use rate of perceived exertion (RPE) scale Yes       Intervention Provide education and explanation on how to use RPE scale       Expected Outcomes Short Term: Able to use RPE daily in rehab to express subjective intensity level;Long Term:  Able to use RPE to guide intensity level when exercising independently       Able to understand and use Dyspnea scale Yes       Intervention Provide education and explanation on how to use Dyspnea scale       Expected Outcomes Short Term: Able to use Dyspnea scale daily in rehab to express subjective sense of shortness of breath during exertion;Long Term: Able to use Dyspnea scale to guide intensity level when exercising independently       Knowledge and understanding of Target Heart Rate Range (THRR) Yes       Intervention Provide education and explanation of THRR including how the numbers were predicted and where they are located for reference       Expected Outcomes Short Term: Able to state/look up THRR;Long Term: Able to use THRR to govern intensity when exercising independently;Short Term: Able to use daily as guideline for intensity in rehab       Understanding of Exercise Prescription Yes       Intervention Provide education, explanation, and written materials on patient's individual exercise prescription       Expected Outcomes Short Term: Able to explain program exercise prescription;Long Term: Able to explain home exercise prescription to exercise independently                Exercise Goals Re-Evaluation :   Discharge  Exercise Prescription (Final  Exercise Prescription Changes):   Nutrition:  Target Goals: Understanding of nutrition guidelines, daily intake of sodium 1500mg , cholesterol 200mg , calories 30% from fat and 7% or less from saturated fats, daily to have 5 or more servings of fruits and vegetables.  Biometrics:  Pre Biometrics - 12/15/23 1430       Pre Biometrics   Grip Strength 22 kg              Nutrition Therapy Plan and Nutrition Goals:   Nutrition Assessments:  MEDIFICTS Score Key: >=70 Need to make dietary changes  40-70 Heart Healthy Diet <= 40 Therapeutic Level Cholesterol Diet   Picture Your Plate Scores: <54 Unhealthy dietary pattern with much room for improvement. 41-50 Dietary pattern unlikely to meet recommendations for good health and room for improvement. 51-60 More healthful dietary pattern, with some room for improvement.  >60 Healthy dietary pattern, although there may be some specific behaviors that could be improved.    Nutrition Goals Re-Evaluation:   Nutrition Goals Discharge (Final Nutrition Goals Re-Evaluation):   Psychosocial: Target Goals: Acknowledge presence or absence of significant depression and/or stress, maximize coping skills, provide positive support system. Participant is able to verbalize types and ability to use techniques and skills needed for reducing stress and depression.  Initial Review & Psychosocial Screening:  Initial Psych Review & Screening - 12/15/23 1317       Initial Review   Current issues with Current Stress Concerns    Source of Stress Concerns Chronic Illness    Comments fear of cardiac event with exercise      Family Dynamics   Good Support System? Yes    Comments friends and son      Barriers   Psychosocial barriers to participate in program The patient should benefit from training in stress management and relaxation.      Screening Interventions   Interventions Encouraged to exercise    Expected Outcomes Short Term goal:  Utilizing psychosocial counselor, staff and physician to assist with identification of specific Stressors or current issues interfering with healing process. Setting desired goal for each stressor or current issue identified.;Long Term Goal: Stressors or current issues are controlled or eliminated.;Short Term goal: Identification and review with participant of any Quality of Life or Depression concerns found by scoring the questionnaire.;Long Term goal: The participant improves quality of Life and PHQ9 Scores as seen by post scores and/or verbalization of changes             Quality of Life Scores:  Scores of 19 and below usually indicate a poorer quality of life in these areas.  A difference of  2-3 points is a clinically meaningful difference.  A difference of 2-3 points in the total score of the Quality of Life Index has been associated with significant improvement in overall quality of life, self-image, physical symptoms, and general health in studies assessing change in quality of life.  PHQ-9: Review Flowsheet       12/15/2023 12/08/2016  Depression screen PHQ 2/9  Decreased Interest 0 0  Down, Depressed, Hopeless 1 0  PHQ - 2 Score 1 0  Altered sleeping 3 -  Tired, decreased energy 3 -  Change in appetite 0 -  Feeling bad or failure about yourself  0 -  Trouble concentrating 0 -  Moving slowly or fidgety/restless 0 -  Suicidal thoughts 0 -  PHQ-9 Score 7 -  Difficult doing work/chores Not difficult at all -   Interpretation  of Total Score  Total Score Depression Severity:  1-4 = Minimal depression, 5-9 = Mild depression, 10-14 = Moderate depression, 15-19 = Moderately severe depression, 20-27 = Severe depression   Psychosocial Evaluation and Intervention:  Psychosocial Evaluation - 12/15/23 1319       Psychosocial Evaluation & Interventions   Interventions Stress management education;Relaxation education;Encouraged to exercise with the program and follow exercise  prescription    Expected Outcomes Pt to exercise to help relieve fear    Continue Psychosocial Services  Follow up required by staff             Psychosocial Re-Evaluation:   Psychosocial Discharge (Final Psychosocial Re-Evaluation):   Education: Education Goals: Education classes will be provided on a weekly basis, covering required topics. Participant will state understanding/return demonstration of topics presented.  Learning Barriers/Preferences:  Learning Barriers/Preferences - 12/15/23 1319       Learning Barriers/Preferences   Learning Barriers None    Learning Preferences Written Material;Skilled Demonstration             Education Topics: Know Your Numbers Group instruction that is supported by a PowerPoint presentation. Instructor discusses importance of knowing and understanding resting, exercise, and post-exercise oxygen saturation, heart rate, and blood pressure. Oxygen saturation, heart rate, blood pressure, rating of perceived exertion, and dyspnea are reviewed along with a normal range for these values.    Exercise for the Pulmonary Patient Group instruction that is supported by a PowerPoint presentation. Instructor discusses benefits of exercise, core components of exercise, frequency, duration, and intensity of an exercise routine, importance of utilizing pulse oximetry during exercise, safety while exercising, and options of places to exercise outside of rehab.    MET Level  Group instruction provided by PowerPoint, verbal discussion, and written material to support subject matter. Instructor reviews what METs are and how to increase METs.    Pulmonary Medications Verbally interactive group education provided by instructor with focus on inhaled medications and proper administration.   Anatomy and Physiology of the Respiratory System Group instruction provided by PowerPoint, verbal discussion, and written material to support subject matter.  Instructor reviews respiratory cycle and anatomical components of the respiratory system and their functions. Instructor also reviews differences in obstructive and restrictive respiratory diseases with examples of each.    Oxygen Safety Group instruction provided by PowerPoint, verbal discussion, and written material to support subject matter. There is an overview of "What is Oxygen" and "Why do we need it".  Instructor also reviews how to create a safe environment for oxygen use, the importance of using oxygen as prescribed, and the risks of noncompliance. There is a brief discussion on traveling with oxygen and resources the patient may utilize.   Oxygen Use Group instruction provided by PowerPoint, verbal discussion, and written material to discuss how supplemental oxygen is prescribed and different types of oxygen supply systems. Resources for more information are provided.    Breathing Techniques Group instruction that is supported by demonstration and informational handouts. Instructor discusses the benefits of pursed lip and diaphragmatic breathing and detailed demonstration on how to perform both.     Risk Factor Reduction Group instruction that is supported by a PowerPoint presentation. Instructor discusses the definition of a risk factor, different risk factors for pulmonary disease, and how the heart and lungs work together.   Pulmonary Diseases Group instruction provided by PowerPoint, verbal discussion, and written material to support subject matter. Instructor gives an overview of the different type of pulmonary diseases. There is also  a discussion on risk factors and symptoms as well as ways to manage the diseases.   Stress and Energy Conservation Group instruction provided by PowerPoint, verbal discussion, and written material to support subject matter. Instructor gives an overview of stress and the impact it can have on the body. Instructor also reviews ways to reduce  stress. There is also a discussion on energy conservation and ways to conserve energy throughout the day.   Warning Signs and Symptoms Group instruction provided by PowerPoint, verbal discussion, and written material to support subject matter. Instructor reviews warning signs and symptoms of stroke, heart attack, cold and flu. Instructor also reviews ways to prevent the spread of infection.   Other Education Group or individual verbal, written, or video instructions that support the educational goals of the pulmonary rehab program.    Knowledge Questionnaire Score:  Knowledge Questionnaire Score - 12/15/23 1317       Knowledge Questionnaire Score   Pre Score 3/18             Core Components/Risk Factors/Patient Goals at Admission:  Personal Goals and Risk Factors at Admission - 12/15/23 1320       Core Components/Risk Factors/Patient Goals on Admission    Weight Management Weight Loss    Improve shortness of breath with ADL's Yes    Intervention Provide education, individualized exercise plan and daily activity instruction to help decrease symptoms of SOB with activities of daily living.    Expected Outcomes Short Term: Improve cardiorespiratory fitness to achieve a reduction of symptoms when performing ADLs;Long Term: Be able to perform more ADLs without symptoms or delay the onset of symptoms             Core Components/Risk Factors/Patient Goals Review:    Core Components/Risk Factors/Patient Goals at Discharge (Final Review):    ITP Comments:   Comments: Dr. Mechele Collin is Medical Director for Pulmonary Rehab at [facility].

## 2023-12-15 NOTE — Progress Notes (Signed)
 Pulmonary Rehab Orientation Physical Assessment Note    Well appearing, A&Ox4, NAD Eyes/Ears: Wears glasses, denies being HOH Lungs: Clear with no wheezes, rales, rhonchi, states she has chronic cough intermittently dry vs phlegm production, white or clear, dyspnea on exertion Heart: Regular rate rhythm, no murmurs, no rubs, no clicks Gastrointestinal: abdomin soft, + bowel sounds in all 4 quads, states he weight yo-yos, baseline around 150#, today 165# endorses normal BMs Genitourinary: WNL, pt denies s/s Extremities:  +2 pulses, grip strength equal, strong, no edema, no cyanosis, no clubbing Integumentary: pt denies any rashes, open or non healing wounds Psy/Soc: Pt denies any needs Assistive devices: none

## 2023-12-15 NOTE — Progress Notes (Signed)
 [name] 73 y.o. female Pulmonary Rehab Orientation Note This patient who was referred to Pulmonary Rehab by Dr. Lynnette Caffey with the diagnosis of DOE arrived today in Cardiac and Pulmonary Rehab. She  arrived ambulatory with normal gait. She  does not carry portable oxygen.   Color good, skin warm and dry. Patient is oriented to time and place. Patient's medical history, psychosocial health, and medications reviewed. Psychosocial assessment reveals patient lives alone. [name] is currently retired. Patient hobbies include art, her dog, and  playing piano . Patient reports her stress level is moderate. Areas of stress/anxiety include health. Patient does exhibit signs of depression. Signs of depression include anxiety and difficulty falling asleep. PHQ2/9 score 1/7. [name] shows fair  coping skills with positive outlook on life. Offered emotional support and reassurance. Will continue to monitor and evaluate progress toward psychosocial goal(s) of decreasing stress and anxiety regarding her health.   Physical assessment performed by [name] Hart RN. Please see their orientation physical assessment note. [name] reports she does take medications as prescribed. Patient states she follows a regular  diet. The patient reports no specific efforts to gain or lose weight.. Patient's weight will be monitored closely.   Demonstration and practice of PLB using pulse oximeter. [name] able to return demonstration satisfactorily. Safety and hand hygiene in the exercise area reviewed with patient. [name] voices understanding of the information reviewed. Department expectations discussed with patient and achievable goals were set. The patient shows enthusiasm about attending the program and we look forward to working with [name]. [name] completed a 6 min walk test today and is scheduled to begin exercise on Thursday 12/21/23 at 1:15 pm.   [id number] [name], BS, ACSM-CEP

## 2023-12-21 ENCOUNTER — Encounter (HOSPITAL_COMMUNITY)
Admission: RE | Admit: 2023-12-21 | Discharge: 2023-12-21 | Disposition: A | Discharge status: Home / Self Care | Payer: Medicare Other | Source: Ambulatory Visit | Attending: Internal Medicine | Admitting: Internal Medicine

## 2023-12-21 DIAGNOSIS — R0609 Other forms of dyspnea: Secondary | ICD-10-CM | POA: Insufficient documentation

## 2023-12-21 NOTE — Progress Notes (Signed)
 Daily Session Note  Patient Details  Name: [name] MRN: [id number] Date of Birth: 1951-05-15 Referring Provider:   Doristine Devoid Pulmonary Rehab Walk Test from 12/15/2023 in [facility]  Referring Provider Lynnette Caffey       Encounter Date: 12/21/2023  Check In:  Session Check In - 12/21/23 1339       Check-In   Supervising physician immediately available to respond to emergencies [facility] MD immediately available    Physician(s) Robin Searing, NP    Location [facility]    Staff Present Elissa Lovett BS, ACSM-CEP, Exercise Physiologist;Mary Gerre Scull, RN, BSN;Harriett Sine, RN, Minerva Areola, MS, ACSM-CEP, Exercise Physiologist    Virtual Visit No    Medication changes reported     No    Fall or balance concerns reported    No    Tobacco Cessation No Change    Warm-up and Cool-down Performed as group-led instruction    Resistance Training Performed Yes    VAD Patient? No    PAD/SET Patient? No      Pain Assessment   Currently in Pain? No/denies    Multiple Pain Sites No             Capillary Blood Glucose: No results found for this or any previous visit (from the past 24 hours).    Social History   Tobacco Use  Smoking Status Never  Smokeless Tobacco Never    Goals Met:  Proper associated with RPD/PD & O2 Sat Exercise tolerated well No report of concerns or symptoms today Strength training completed today  Goals Unmet:  Not Applicable  Comments: Service time is from 1323 to 1450.    Dr. Mechele Collin is Medical Director for Pulmonary Rehab at [facility].

## 2023-12-26 ENCOUNTER — Encounter (HOSPITAL_COMMUNITY)
Admission: RE | Admit: 2023-12-26 | Discharge: 2023-12-26 | Disposition: A | Discharge status: Home / Self Care | Payer: Medicare Other | Source: Ambulatory Visit | Attending: Internal Medicine | Admitting: Internal Medicine

## 2023-12-26 DIAGNOSIS — R0609 Other forms of dyspnea: Secondary | ICD-10-CM | POA: Diagnosis not present

## 2023-12-26 NOTE — Progress Notes (Signed)
 Daily Session Note  Patient Details  Name: [name] MRN: [id number] Date of Birth: [birth year] Referring Provider:   Doristine Devoid Pulmonary Rehab Walk Test from 12/15/2023 in [facility]  Referring Provider Lynnette Caffey       Encounter Date: 12/26/2023  Check In:  Session Check In - 12/26/23 1328       Check-In   Supervising physician immediately available to respond to emergencies [facility] MD immediately available    Physician(s) Tereso Newcomer, PA    Location [facility]    Staff Present Elissa Lovett BS, ACSM-CEP, Exercise Physiologist;Kaylee Earlene Plater, MS, ACSM-CEP, Exercise Physiologist;Maria Harlon Flor, RN, Fuller Plan, RT    Virtual Visit No    Medication changes reported     No    Fall or balance concerns reported    No    Tobacco Cessation No Change    Warm-up and Cool-down Performed as group-led instruction    Resistance Training Performed Yes    VAD Patient? No    PAD/SET Patient? No      Pain Assessment   Currently in Pain? No/denies    Multiple Pain Sites No             Capillary Blood Glucose: No results found for this or any previous visit (from the past 24 hours).    Social History   Tobacco Use  Smoking Status Never  Smokeless Tobacco Never    Goals Met:  Proper associated with RPD/PD & O2 Sat Independence with exercise equipment Exercise tolerated well No report of concerns or symptoms today Strength training completed today  Goals Unmet:  Not Applicable  Comments: Service time is from 1319 to 1436.    Dr. Mechele Collin is Medical Director for Pulmonary Rehab at [facility].

## 2023-12-28 ENCOUNTER — Encounter (HOSPITAL_COMMUNITY)
Admission: RE | Admit: 2023-12-28 | Discharge: 2023-12-28 | Disposition: A | Discharge status: Home / Self Care | Payer: Medicare Other | Source: Ambulatory Visit | Attending: Internal Medicine | Admitting: Internal Medicine

## 2023-12-28 DIAGNOSIS — R0609 Other forms of dyspnea: Secondary | ICD-10-CM

## 2023-12-28 DIAGNOSIS — E039 Hypothyroidism, unspecified: Secondary | ICD-10-CM | POA: Diagnosis not present

## 2023-12-28 DIAGNOSIS — R748 Abnormal levels of other serum enzymes: Secondary | ICD-10-CM | POA: Diagnosis not present

## 2023-12-28 NOTE — Progress Notes (Signed)
 Daily Session Note  Patient Details  Name: [name] MRN: [id number] Date of Birth: [birth year] Referring Provider:   Doristine Devoid Pulmonary Rehab Walk Test from 12/15/2023 in [facility]  Referring Provider Lynnette Caffey       Encounter Date: 12/28/2023  Check In:  Session Check In - 12/28/23 1412       Check-In   Supervising physician immediately available to respond to emergencies [facility] MD immediately available    Physician(s) Edd Fabian, NP    Location [facility]    Staff Present Elissa Lovett BS, ACSM-CEP, Exercise Physiologist;Kaylee Earlene Plater, MS, ACSM-CEP, Exercise Physiologist;Rj Pedrosa Chester Holstein, MS, Exercise Physiologist    Virtual Visit No    Medication changes reported     No    Fall or balance concerns reported    No    Tobacco Cessation No Change    Warm-up and Cool-down Performed as group-led instruction    Resistance Training Performed Yes    VAD Patient? No    PAD/SET Patient? No      Pain Assessment   Currently in Pain? No/denies    Multiple Pain Sites No             Capillary Blood Glucose: No results found for this or any previous visit (from the past 24 hours).    Social History   Tobacco Use  Smoking Status Never  Smokeless Tobacco Never    Goals Met:  Proper associated with RPD/PD & O2 Sat Independence with exercise equipment Exercise tolerated well No report of concerns or symptoms today Strength training completed today  Goals Unmet:  Not Applicable  Comments: Service time is from 1317 to 1435.    Dr. Mechele Collin is Medical Director for Pulmonary Rehab at [facility].

## 2024-01-02 ENCOUNTER — Encounter (HOSPITAL_COMMUNITY)
Admission: RE | Admit: 2024-01-02 | Discharge: 2024-01-02 | Disposition: A | Discharge status: Home / Self Care | Payer: Medicare Other | Source: Ambulatory Visit | Attending: Internal Medicine | Admitting: Internal Medicine

## 2024-01-02 VITALS — Wt 164.5 lb

## 2024-01-02 DIAGNOSIS — R0609 Other forms of dyspnea: Secondary | ICD-10-CM | POA: Diagnosis not present

## 2024-01-02 NOTE — Progress Notes (Signed)
 Daily Session Note  Patient Details  Name: MARIONNA GONIA MRN: [id number] Date of Birth: 03-27-1951 Referring Provider:   Doristine Devoid Pulmonary Rehab Walk Test from 12/15/2023 in [facility]  Referring Provider Lynnette Caffey       Encounter Date: 01/02/2024  Check In:  Session Check In - 01/02/24 1422       Check-In   Supervising physician immediately available to respond to emergencies [facility] MD immediately available    Physician(s) Edd Fabian, NP    [facility]    Staff Present Elissa Lovett BS, ACSM-CEP, Exercise Physiologist;Kaylee Earlene Plater, MS, ACSM-CEP, Exercise Physiologist;Casey Synthia Innocent, RN, BSN    Virtual Visit No    Medication changes reported     No    Fall or balance concerns reported    No    Tobacco Cessation No Change    Warm-up and Cool-down Performed as group-led instruction    Resistance Training Performed Yes    VAD Patient? No    PAD/SET Patient? No      Pain Assessment   Currently in Pain? No/denies    Multiple Pain Sites No             Capillary Blood Glucose: No results found for this or any previous visit (from the past 24 hours).   Exercise Prescription Changes - 01/02/24 1500       Response to Exercise   Blood Pressure (Admit) 128/80    Blood Pressure (Exercise) 140/78    Blood Pressure (Exit) 120/70    Heart Rate (Admit) 80 bpm    Heart Rate (Exercise) 115 bpm    Heart Rate (Exit) 96 bpm    Oxygen Saturation (Admit) 99 %    Oxygen Saturation (Exercise) 95 %    Oxygen Saturation (Exit) 97 %    Rating of Perceived Exertion (Exercise) 11    Perceived Dyspnea (Exercise) 1    Duration Continue with 30 min of aerobic exercise without signs/symptoms of physical distress.    Intensity THRR unchanged      Resistance Training   Training Prescription Yes    Weight black bands    Reps 10-15    Time 10 Minutes      Elliptical   Level 3    Speed  2    Minutes 15    METs 5.2             Social History   Tobacco Use  Smoking Status Never  Smokeless Tobacco Never    Goals Met:  Independence with exercise equipment Exercise tolerated well No report of concerns or symptoms today Strength training completed today  Goals Unmet:  Not Applicable  Comments: Service time is from 1321 to 1439    Dr. Mechele Collin is Medical Director for Pulmonary Rehab at [facility].

## 2024-01-03 NOTE — Progress Notes (Signed)
 Pulmonary Individual Treatment Plan  Patient Details  Name: [name] MRN: [id number] Date of Birth: Dec 09, 1950 Referring Provider:   Doristine Devoid Pulmonary Rehab Walk Test from 12/15/2023 in [facility]  Referring Provider Thukkani       Initial Encounter Date:  Flowsheet Row Pulmonary Rehab Walk Test from 12/15/2023 in [facility], [facility], [facility]  Date 12/15/23       Visit Diagnosis: DOE (dyspnea on exertion)  Patient's Home Medications on Admission:   Current Outpatient Medications:    Bempedoic Acid-Ezetimibe (NEXLIZET) 180-10 MG TABS, Take 1 tablet by mouth at bedtime., Disp: 90 tablet, Rfl: 3   Cholecalciferol (VITAMIN D3) 50 MCG (2000 UT) capsule, Take 2,000 Units by mouth daily., Disp: , Rfl:    ciclopirox (LOPROX) 0.77 % cream, Apply 1 application. topically 2 (two) times daily as needed (foot fungus)., Disp: , Rfl:    clotrimazole (LOTRIMIN) 1 % cream, as needed., Disp: , Rfl:    metoprolol succinate (TOPROL-XL) 25 MG 24 hr tablet, Take 1 tablet (25 mg total) by mouth daily. Pt takes 1/2 tablet once a day., Disp: 90 tablet, Rfl: 3   Multiple Vitamins-Minerals (MULTIVITAMINS THER. W/MINERALS) TABS tablet, Take 1 tablet by mouth daily., Disp: , Rfl:    SYNTHROID 25 MCG tablet, Take 25 mcg by mouth. As directed, Disp: , Rfl:   Past Medical History: Past Medical History:  Diagnosis Date   Adenomatous polyp    Allergic rhinitis    Anxiety    AV Nodal Reentry Tachycardia    s/p RFCA GT 2/15   AVNRT (AV nodal re-entry tachycardia) (HCC) 11/06/2013   Calcified granuloma of lung    Cataract    CHF (congestive heart failure) (HCC)    Diverticulosis 01/2020   Gastritis    GERD (gastroesophageal reflux disease)    H/O radiofrequency ablation for complex left atrial arrhythmia 12/12/2013   Headache    Hematuria    Hemorrhoids    HTN (hypertension)    Hyperlipemia     Hypothyroidism    Insomnia    Iron deficiency    Irregular heart beat    Laryngopharyngeal reflux (LPR)    Migraine    Mitral valve prolapse    ?   Rectal leakage    1 time   Retinal tear of both eyes    Skin lesion of breast    Vitamin D deficiency    Vulvodynia     Tobacco Use: Social History   Tobacco Use  Smoking Status Never  Smokeless Tobacco Never    Labs: Review Flowsheet       Latest Ref Rng & Units 12/29/2010 01/18/2012 04/07/2022  Labs for ITP Cardiac and Pulmonary Rehab  Cholestrol 0 - 200 mg/dL - 409  -  Direct LDL mg/dL - 811.9  -  HDL-C >14.78 mg/dL - 29.56  -  Trlycerides 0.0 - 149.0 mg/dL - 213.0  -  Bicarbonate 20.0 - 28.0 mmol/L - - 24.6  25.2  25.4  25.7  24.0  25.6   TCO2 22 - 32 mmol/L 24  - 26  26  26  27  25  27    O2 Saturation % - - 63  63  66  58  70  57     Details       Multiple values from one day are sorted in reverse-chronological order  Capillary Blood Glucose: No results found for: "GLUCAP"   Pulmonary Assessment Scores:  Pulmonary Assessment Scores     Row Name 12/15/23 1307         ADL UCSD   ADL Phase Entry     SOB Score total 42       CAT Score   CAT Score 20       mMRC Score   mMRC Score 3             UCSD: Self-administered rating of dyspnea associated with activities of daily living (ADLs) 6-point scale (0 = "not at all" to 5 = "maximal or unable to do because of breathlessness")  Scoring Scores range from 0 to 120.  Minimally important difference is 5 units  CAT: CAT can identify the health impairment of COPD patients and is better correlated with disease progression.  CAT has a scoring range of zero to 40. The CAT score is classified into four groups of low (less than 10), medium (10 - 20), high (21-30) and very high (31-40) based on the impact level of disease on health status. A CAT score over 10 suggests significant symptoms.  A worsening CAT score could be explained by an exacerbation,  poor medication adherence, poor inhaler technique, or progression of COPD or comorbid conditions.  CAT MCID is 2 points  mMRC: mMRC (Modified Medical Research Council) Dyspnea Scale is used to assess the degree of baseline functional disability in patients of respiratory disease due to dyspnea. No minimal important difference is established. A decrease in score of 1 point or greater is considered a positive change.   Pulmonary Function Assessment:  Pulmonary Function Assessment - 12/15/23 1431       Breath   Bilateral Breath Sounds Clear    Shortness of Breath Fear of Shortness of Breath             Exercise Target Goals: Exercise Program Goal: Individual exercise prescription set using results from initial 6 min walk test and THRR while considering  patient's activity barriers and safety.   Exercise Prescription Goal: Initial exercise prescription builds to 30-45 minutes a day of aerobic activity, 2-3 days per week.  Home exercise guidelines will be given to patient during program as part of exercise prescription that the participant will acknowledge.  Activity Barriers & Risk Stratification:  Activity Barriers & Cardiac Risk Stratification - 12/15/23 1322       Activity Barriers & Cardiac Risk Stratification   Activity Barriers Deconditioning;Muscular Weakness;Shortness of Breath    Cardiac Risk Stratification Moderate             6 Minute Walk:  6 Minute Walk     Row Name 12/15/23 1418         6 Minute Walk   Phase Initial     Distance 1680 feet     Walk Time 6 minutes     # of Rest Breaks 0     MPH 3.18     METS 3.46     RPE 13     Perceived Dyspnea  1     VO2 Peak 12.11     Symptoms No     Resting HR 79 bpm     Resting BP 122/80     Resting Oxygen Saturation  98 %     Exercise Oxygen Saturation  during 6 min walk 96 %     Max Ex. HR 122 bpm     Max Ex. BP 164/82  2 Minute Post BP 132/80       Interval HR   1 Minute HR 104     2 Minute HR  114     3 Minute HR 117     4 Minute HR 118     5 Minute HR 121     6 Minute HR 122     2 Minute Post HR 87     Interval Heart Rate? Yes       Interval Oxygen   Interval Oxygen? Yes     Baseline Oxygen Saturation % 98 %     1 Minute Oxygen Saturation % 97 %     1 Minute Liters of Oxygen 0 L     2 Minute Oxygen Saturation % 97 %     2 Minute Liters of Oxygen 0 L     3 Minute Oxygen Saturation % 97 %     3 Minute Liters of Oxygen 0 L     4 Minute Oxygen Saturation % 98 %     4 Minute Liters of Oxygen 0 L     5 Minute Oxygen Saturation % 96 %     5 Minute Liters of Oxygen 0 L     6 Minute Oxygen Saturation % 96 %     6 Minute Liters of Oxygen 0 L     2 Minute Post Oxygen Saturation % 99 %     2 Minute Post Liters of Oxygen 0 L              Oxygen Initial Assessment:  Oxygen Initial Assessment - 12/15/23 1321       Home Oxygen   Home Oxygen Device None    Sleep Oxygen Prescription None    Home Exercise Oxygen Prescription None    Home Resting Oxygen Prescription None      Initial 6 min Walk   Oxygen Used None      Program Oxygen Prescription   Program Oxygen Prescription None      Intervention   Short Term Goals To learn and understand importance of maintaining oxygen saturations>88%;To learn and understand importance of monitoring SPO2 with pulse oximeter and demonstrate accurate use of the pulse oximeter.;To learn and demonstrate proper pursed lip breathing techniques or other breathing techniques.     Long  Term Goals Verbalizes importance of monitoring SPO2 with pulse oximeter and return demonstration;Maintenance of O2 saturations>88%;Exhibits proper breathing techniques, such as pursed lip breathing or other method taught during program session             Oxygen Re-Evaluation:  Oxygen Re-Evaluation     Row Name 12/29/23 0837             Program Oxygen Prescription   Program Oxygen Prescription None         Home Oxygen   Home Oxygen Device None        Sleep Oxygen Prescription None       Home Exercise Oxygen Prescription None       Home Resting Oxygen Prescription None         Goals/Expected Outcomes   Short Term Goals To learn and understand importance of maintaining oxygen saturations>88%;To learn and understand importance of monitoring SPO2 with pulse oximeter and demonstrate accurate use of the pulse oximeter.;To learn and demonstrate proper pursed lip breathing techniques or other breathing techniques.        Long  Term Goals Verbalizes importance of monitoring SPO2 with pulse oximeter and  return demonstration;Maintenance of O2 saturations>88%;Exhibits proper breathing techniques, such as pursed lip breathing or other method taught during program session       Goals/Expected Outcomes Compliance and understanding of oxygen saturation monitoring and breath techniques to decrease shortness of breath.                Oxygen Discharge (Final Oxygen Re-Evaluation):  Oxygen Re-Evaluation - 12/29/23 0837       Program Oxygen Prescription   Program Oxygen Prescription None      Home Oxygen   Home Oxygen Device None    Sleep Oxygen Prescription None    Home Exercise Oxygen Prescription None    Home Resting Oxygen Prescription None      Goals/Expected Outcomes   Short Term Goals To learn and understand importance of maintaining oxygen saturations>88%;To learn and understand importance of monitoring SPO2 with pulse oximeter and demonstrate accurate use of the pulse oximeter.;To learn and demonstrate proper pursed lip breathing techniques or other breathing techniques.     Long  Term Goals Verbalizes importance of monitoring SPO2 with pulse oximeter and return demonstration;Maintenance of O2 saturations>88%;Exhibits proper breathing techniques, such as pursed lip breathing or other method taught during program session    Goals/Expected Outcomes Compliance and understanding of oxygen saturation monitoring and breath techniques to  decrease shortness of breath.             Initial Exercise Prescription:  Initial Exercise Prescription - 12/15/23 1400       Date of Initial Exercise RX and Referring Provider   Date 12/15/23    Referring Provider Thukkani    Expected Discharge Date 12/15/23      Treadmill   MPH 2.8    Grade 0    Minutes 15    METs 3.14      Elliptical   Level 1    Speed 1    Minutes 15    METs 3.14      Prescription Details   Frequency (times per week) 2    Duration Progress to 30 minutes of continuous aerobic without signs/symptoms of physical distress      Intensity   THRR 40-80% of Max Heartrate 60-119    Ratings of Perceived Exertion 11-13    Perceived Dyspnea 0-4      Progression   Progression Continue to progress workloads to maintain intensity without signs/symptoms of physical distress.      Resistance Training   Training Prescription Yes    Weight blue bands    Reps 10-15             Perform Capillary Blood Glucose checks as needed.  Exercise Prescription Changes:   Exercise Prescription Changes     Row Name 01/02/24 1500             Response to Exercise   Blood Pressure (Admit) 128/80       Blood Pressure (Exercise) 140/78       Blood Pressure (Exit) 120/70       Heart Rate (Admit) 80 bpm       Heart Rate (Exercise) 115 bpm       Heart Rate (Exit) 96 bpm       Oxygen Saturation (Admit) 99 %       Oxygen Saturation (Exercise) 95 %       Oxygen Saturation (Exit) 97 %       Rating of Perceived Exertion (Exercise) 11       Perceived Dyspnea (Exercise) 1  Duration Continue with 30 min of aerobic exercise without signs/symptoms of physical distress.       Intensity THRR unchanged         Resistance Training   Training Prescription Yes       Weight black bands       Reps 10-15       Time 10 Minutes         Elliptical   Level 3       Speed 2       Minutes 15       METs 5.2                Exercise Comments:   Exercise Comments      Row Name 12/21/23 1521           Exercise Comments Pt completed first day of exericse. Yeraldin exercised for 15 min on the upright elliptical and treadmill. Ravonda averaged 4.6 METs at level and speed 1 on the upright elliptical and 4.3 METs at 3.5 mph and 3.2% incline on the treadmill. Luke performed the warmup and cooldown standing without limitations. Discussed METs. Pt voiced understanding.                Exercise Goals and Review:   Exercise Goals     Row Name 12/15/23 1322             Exercise Goals   Increase Physical Activity Yes       Intervention Provide advice, education, support and counseling about physical activity/exercise needs.;Develop an individualized exercise prescription for aerobic and resistive training based on initial evaluation findings, risk stratification, comorbidities and participant's personal goals.       Expected Outcomes Short Term: Attend rehab on a regular basis to increase amount of physical activity.;Long Term: Add in home exercise to make exercise part of routine and to increase amount of physical activity.;Long Term: Exercising regularly at least 3-5 days a week.       Increase Strength and Stamina Yes       Intervention Provide advice, education, support and counseling about physical activity/exercise needs.;Develop an individualized exercise prescription for aerobic and resistive training based on initial evaluation findings, risk stratification, comorbidities and participant's personal goals.       Expected Outcomes Short Term: Increase workloads from initial exercise prescription for resistance, speed, and METs.;Short Term: Perform resistance training exercises routinely during rehab and add in resistance training at home;Long Term: Improve cardiorespiratory fitness, muscular endurance and strength as measured by increased METs and functional capacity ( )       Able to understand and use rate of perceived exertion (RPE) scale Yes        Intervention Provide education and explanation on how to use RPE scale       Expected Outcomes Short Term: Able to use RPE daily in rehab to express subjective intensity level;Long Term:  Able to use RPE to guide intensity level when exercising independently       Able to understand and use Dyspnea scale Yes       Intervention Provide education and explanation on how to use Dyspnea scale       Expected Outcomes Short Term: Able to use Dyspnea scale daily in rehab to express subjective sense of shortness of breath during exertion;Long Term: Able to use Dyspnea scale to guide intensity level when exercising independently       Knowledge and understanding of Target Heart Rate Range (THRR) Yes  Intervention Provide education and explanation of THRR including how the numbers were predicted and where they are located for reference       Expected Outcomes Short Term: Able to state/look up THRR;Long Term: Able to use THRR to govern intensity when exercising independently;Short Term: Able to use daily as guideline for intensity in rehab       Understanding of Exercise Prescription Yes       Intervention Provide education, explanation, and written materials on patient's individual exercise prescription       Expected Outcomes Short Term: Able to explain program exercise prescription;Long Term: Able to explain home exercise prescription to exercise independently                Exercise Goals Re-Evaluation :  Exercise Goals Re-Evaluation     Row Name 12/29/23 0837             Exercise Goal Re-Evaluation   Exercise Goals Review Increase Physical Activity;Able to understand and use Dyspnea scale;Understanding of Exercise Prescription;Increase Strength and Stamina;Knowledge and understanding of Target Heart Rate Range (THRR);Able to understand and use rate of perceived exertion (RPE) scale       Comments Pt has completed 3 exercise sessions. She is motivated and tolerating well. She is exercising  on the upright elliptical for 15 min, level 2, incline 1, METs 5.6. She then is walking on the treadmill for 15 min, 3.4 mph, 3% incline, METs 4.6. She completes warm up and cool down without limitations, currently using blue bands (5.8 lbs) but doubles the band for some exercises for 11 lbs. Will progress as tolerated.       Expected Outcomes Through exercise at rehab and home, the patient will decrease shortness of breath with daily activities and feel confident in carrying out an exercise regimen at home.                Discharge Exercise Prescription (Final Exercise Prescription Changes):  Exercise Prescription Changes - 01/02/24 1500       Response to Exercise   Blood Pressure (Admit) 128/80    Blood Pressure (Exercise) 140/78    Blood Pressure (Exit) 120/70    Heart Rate (Admit) 80 bpm    Heart Rate (Exercise) 115 bpm    Heart Rate (Exit) 96 bpm    Oxygen Saturation (Admit) 99 %    Oxygen Saturation (Exercise) 95 %    Oxygen Saturation (Exit) 97 %    Rating of Perceived Exertion (Exercise) 11    Perceived Dyspnea (Exercise) 1    Duration Continue with 30 min of aerobic exercise without signs/symptoms of physical distress.    Intensity THRR unchanged      Resistance Training   Training Prescription Yes    Weight black bands    Reps 10-15    Time 10 Minutes      Elliptical   Level 3    Speed 2    Minutes 15    METs 5.2             Nutrition:  Target Goals: Understanding of nutrition guidelines, daily intake of sodium 1500mg , cholesterol 200mg , calories 30% from fat and 7% or less from saturated fats, daily to have 5 or more servings of fruits and vegetables.  Biometrics:  Pre Biometrics - 12/15/23 1430       Pre Biometrics   Grip Strength 22 kg              Nutrition Therapy Plan and Nutrition Goals:  Nutrition Therapy & Goals - 12/26/23 1512       Nutrition Therapy   Diet Heart Healthy Diet      Personal Nutrition Goals   Nutrition Goal  Patient to improve diet quality by using the plate method as a guide for meal planning to include lean protein/plant protein, fruits, vegetables, whole grains, nonfat dairy as part of a well-balanced diet.    Personal Goal #2 Patient to identify strategies for weight loss of 0.5-2.0# per week.    Personal Goal #3 Patient to limit sodium intake to 2300mg  per day    Comments Karletta has medical history of hyperlipidemia, CHF, DOE, hypothyroidism. Liel reports concern of weight gain; we discussed weight monitoring and reading food label for sodium. We further discussed strategies for weight loss including calorie density, decreasing calorie dense foods, protein supplements, etc. She is up ~12.5# over the last year (69.5kg at 02/06/23 visit). She does report drinking>32oz of 2% milk/chocolate milk daily. She reports that she has decreased eating ice cream over the last three weeks. Patient will benefit from participation in pulmonary rehab for nutrition, exercise, and lifestyle modification.      Intervention Plan   Intervention Prescribe, educate and counsel regarding individualized specific dietary modifications aiming towards targeted core components such as weight, hypertension, lipid management, diabetes, heart failure and other comorbidities.;Nutrition handout(s) given to patient.    Expected Outcomes Short Term Goal: Understand basic principles of dietary content, such as calories, fat, sodium, cholesterol and nutrients.;Long Term Goal: Adherence to prescribed nutrition plan.             Nutrition Assessments:  Nutrition Assessments - 12/21/23 1621       Rate Your Plate Scores   Pre Score 53            MEDIFICTS Score Key: >=70 Need to make dietary changes  40-70 Heart Healthy Diet <= 40 Therapeutic Level Cholesterol Diet  Flowsheet Row PULMONARY REHAB OTHER RESPIRATORY from 12/21/2023 in [facility], [facility], & Lung Health  Picture Your Plate Total  Score on Admission 53      Picture Your Plate Scores: <16 Unhealthy dietary pattern with much room for improvement. 41-50 Dietary pattern unlikely to meet recommendations for good health and room for improvement. 51-60 More healthful dietary pattern, with some room for improvement.  >60 Healthy dietary pattern, although there may be some specific behaviors that could be improved.    Nutrition Goals Re-Evaluation:  Nutrition Goals Re-Evaluation     Row Name 12/26/23 1512             Goals   Current Weight 165 lb 12.6 oz (75.2 kg)       Comment cholesterol 213, LDL 227 (allergy to pravastatin, rosuvastatin)       Expected Outcome Daniah has medical history of hyperlipidemia, CHF, DOE, hypothyroidism. Esme reports concern of weight gain; we discussed weight monitoring and reading food label for sodium. We further discussed strategies for weight loss including calorie density, decreasing calorie dense foods, protein supplements, etc. She is up ~12.5# over the last year (69.5kg at 02/06/23 visit). She does report drinking>32oz of 2% milk/chocolate milk daily. She reports that she has decreased eating ice cream over the last three weeks. Patient will benefit from participation in pulmonary rehab for nutrition, exercise, and lifestyle modification.                Nutrition Goals Discharge (Final Nutrition Goals Re-Evaluation):  Nutrition Goals Re-Evaluation -  12/26/23 1512       Goals   Current Weight 165 lb 12.6 oz (75.2 kg)    Comment cholesterol 213, LDL 227 (allergy to pravastatin, rosuvastatin)    Expected Outcome Aziyah has medical history of hyperlipidemia, CHF, DOE, hypothyroidism. [place] reports concern of weight gain; we discussed weight monitoring and reading food label for sodium. We further discussed strategies for weight loss including calorie density, decreasing calorie dense foods, protein supplements, etc. She is up ~12.5# over the last year (69.5kg at 02/06/23 visit). She  does report drinking>32oz of 2% milk/chocolate milk daily. She reports that she has decreased eating ice cream over the last three weeks. Patient will benefit from participation in pulmonary rehab for nutrition, exercise, and lifestyle modification.             Psychosocial: Target Goals: Acknowledge presence or absence of significant depression and/or stress, maximize coping skills, provide positive support system. Participant is able to verbalize types and ability to use techniques and skills needed for reducing stress and depression.  Initial Review & Psychosocial Screening:  Initial Psych Review & Screening - 12/15/23 1317       Initial Review   Current issues with Current Stress Concerns    Source of Stress Concerns Chronic Illness    Comments fear of cardiac event with exercise      Family Dynamics   Good Support System? Yes    Comments friends and son      Barriers   Psychosocial barriers to participate in program The patient should benefit from training in stress management and relaxation.      Screening Interventions   Interventions Encouraged to exercise    Expected Outcomes Short Term goal: Utilizing psychosocial counselor, staff and physician to assist with identification of specific Stressors or current issues interfering with healing process. Setting desired goal for each stressor or current issue identified.;Long Term Goal: Stressors or current issues are controlled or eliminated.;Short Term goal: Identification and review with participant of any Quality of Life or Depression concerns found by scoring the questionnaire.;Long Term goal: The participant improves quality of Life and PHQ9 Scores as seen by post scores and/or verbalization of changes             Quality of Life Scores:  Scores of 19 and below usually indicate a poorer quality of life in these areas.  A difference of  2-3 points is a clinically meaningful difference.  A difference of 2-3 points in the  total score of the Quality of Life Index has been associated with significant improvement in overall quality of life, self-image, physical symptoms, and general health in studies assessing change in quality of life.  PHQ-9: Review Flowsheet       12/15/2023 12/08/2016  Depression screen PHQ 2/9  Decreased Interest 0 0  Down, Depressed, Hopeless 1 0  PHQ - 2 Score 1 0  Altered sleeping 3 -  Tired, decreased energy 3 -  Change in appetite 0 -  Feeling bad or failure about yourself  0 -  Trouble concentrating 0 -  Moving slowly or fidgety/restless 0 -  Suicidal thoughts 0 -  PHQ-9 Score 7 -  Difficult doing work/chores Not difficult at all -   Interpretation of Total Score  Total Score Depression Severity:  1-4 = Minimal depression, 5-9 = Mild depression, 10-14 = Moderate depression, 15-19 = Moderately severe depression, 20-27 = Severe depression   Psychosocial Evaluation and Intervention:  Psychosocial Evaluation - 12/15/23 1319  Psychosocial Evaluation & Interventions   Interventions Stress management education;Relaxation education;Encouraged to exercise with the program and follow exercise prescription    Expected Outcomes Pt to exercise to help relieve fear    Continue Psychosocial Services  Follow up required by staff             Psychosocial Re-Evaluation:  Psychosocial Re-Evaluation     Row Name 12/28/23 1544             Psychosocial Re-Evaluation   Current issues with Current Stress Concerns       Comments Candas feels less stressed about exercising since starting PR class. She denies any new psychosocial barriers or concerns at this time.       Expected Outcomes For Nuri to partcipate in PR with less stress       Interventions Encouraged to attend Pulmonary Rehabilitation for the exercise       Continue Psychosocial Services  Follow up required by staff                Psychosocial Discharge (Final Psychosocial Re-Evaluation):  Psychosocial  Re-Evaluation - 12/28/23 1544       Psychosocial Re-Evaluation   Current issues with Current Stress Concerns    Comments Addilee feels less stressed about exercising since starting PR class. She denies any new psychosocial barriers or concerns at this time.    Expected Outcomes For Uri to partcipate in PR with less stress    Interventions Encouraged to attend Pulmonary Rehabilitation for the exercise    Continue Psychosocial Services  Follow up required by staff             Education: Education Goals: Education classes will be provided on a weekly basis, covering required topics. Participant will state understanding/return demonstration of topics presented.  Learning Barriers/Preferences:  Learning Barriers/Preferences - 12/15/23 1319       Learning Barriers/Preferences   Learning Barriers None    Learning Preferences Written Material;Skilled Demonstration             Education Topics: Know Your Numbers Group instruction that is supported by a PowerPoint presentation. Instructor discusses importance of knowing and understanding resting, exercise, and post-exercise oxygen saturation, heart rate, and blood pressure. Oxygen saturation, heart rate, blood pressure, rating of perceived exertion, and dyspnea are reviewed along with a normal range for these values.    Exercise for the Pulmonary Patient Group instruction that is supported by a PowerPoint presentation. Instructor discusses benefits of exercise, core components of exercise, frequency, duration, and intensity of an exercise routine, importance of utilizing pulse oximetry during exercise, safety while exercising, and options of places to exercise outside of rehab.    MET Level  Group instruction provided by PowerPoint, verbal discussion, and written material to support subject matter. Instructor reviews what METs are and how to increase METs.    Pulmonary Medications Verbally interactive group education provided by  instructor with focus on inhaled medications and proper administration.   Anatomy and Physiology of the Respiratory System Group instruction provided by PowerPoint, verbal discussion, and written material to support subject matter. Instructor reviews respiratory cycle and anatomical components of the respiratory system and their functions. Instructor also reviews differences in obstructive and restrictive respiratory diseases with examples of each.  Flowsheet Row PULMONARY REHAB OTHER RESPIRATORY from 12/21/2023 in [facility] for Heart, Vascular, & Lung Health  Date 12/21/23  Educator RT  Instruction Review Code 1- Verbalizes Understanding       Oxygen Safety  Group instruction provided by PowerPoint, verbal discussion, and written material to support subject matter. There is an overview of "What is Oxygen" and "Why do we need it".  Instructor also reviews how to create a safe environment for oxygen use, the importance of using oxygen as prescribed, and the risks of noncompliance. There is a brief discussion on traveling with oxygen and resources the patient may utilize.   Oxygen Use Group instruction provided by PowerPoint, verbal discussion, and written material to discuss how supplemental oxygen is prescribed and different types of oxygen supply systems. Resources for more information are provided.    Breathing Techniques Group instruction that is supported by demonstration and informational handouts. Instructor discusses the benefits of pursed lip and diaphragmatic breathing and detailed demonstration on how to perform both.     Risk Factor Reduction Group instruction that is supported by a PowerPoint presentation. Instructor discusses the definition of a risk factor, different risk factors for pulmonary disease, and how the heart and lungs work together.   Pulmonary Diseases Group instruction provided by PowerPoint, verbal discussion, and written material to  support subject matter. Instructor gives an overview of the different type of pulmonary diseases. There is also a discussion on risk factors and symptoms as well as ways to manage the diseases.   Stress and Energy Conservation Group instruction provided by PowerPoint, verbal discussion, and written material to support subject matter. Instructor gives an overview of stress and the impact it can have on the body. Instructor also reviews ways to reduce stress. There is also a discussion on energy conservation and ways to conserve energy throughout the day.   Warning Signs and Symptoms Group instruction provided by PowerPoint, verbal discussion, and written material to support subject matter. Instructor reviews warning signs and symptoms of stroke, heart attack, cold and flu. Instructor also reviews ways to prevent the spread of infection.   Other Education Group or individual verbal, written, or video instructions that support the educational goals of the pulmonary rehab program. Flowsheet Row PULMONARY REHAB OTHER RESPIRATORY from 12/28/2023 in [facility] for Heart, Vascular, & Lung Health  Date 12/28/23  Educator EP  Instruction Review Code 1- Verbalizes Understanding        Knowledge Questionnaire Score:  Knowledge Questionnaire Score - 12/15/23 1317       Knowledge Questionnaire Score   Pre Score 3/18             Core Components/Risk Factors/Patient Goals at Admission:  Personal Goals and Risk Factors at Admission - 12/15/23 1320       Core Components/Risk Factors/Patient Goals on Admission    Weight Management Weight Loss    Improve shortness of breath with ADL's Yes    Intervention Provide education, individualized exercise plan and daily activity instruction to help decrease symptoms of SOB with activities of daily living.    Expected Outcomes Short Term: Improve cardiorespiratory fitness to achieve a reduction of symptoms when performing  ADLs;Long Term: Be able to perform more ADLs without symptoms or delay the onset of symptoms             Core Components/Risk Factors/Patient Goals Review:   Goals and Risk Factor Review     Row Name 12/28/23 1546             Core Components/Risk Factors/Patient Goals Review   Personal Goals Review Weight Management/Obesity;Improve shortness of breath with ADL's;Develop more efficient breathing techniques such as purse lipped breathing and diaphragmatic breathing and practicing  self-pacing with activity.       Review Yasemin has only attended 3 sessions so far. Goal progressing for improving shortness of breath with ADL's. Samora is currently able to maintain sats >88% on RA while exercising. She is currently exercising on the elliptical and the treadmill. Goal progressing for developing more efficient breathing techniques such as purse lipped breathing and diaphragmatic breathing; and practicing self-pacing with activity. We will continue to monitor Kaydince's progress throughout the program.       Expected Outcomes To improve shortness of breath with ADL's and develop more efficient breathing techniques such as purse lipped breathing and diaphragmatic breathing; and practicing self-pacing with activity.                Core Components/Risk Factors/Patient Goals at Discharge (Final Review):   Goals and Risk Factor Review - 12/28/23 1546       Core Components/Risk Factors/Patient Goals Review   Personal Goals Review Weight Management/Obesity;Improve shortness of breath with ADL's;Develop more efficient breathing techniques such as purse lipped breathing and diaphragmatic breathing and practicing self-pacing with activity.    Review Lexianna has only attended 3 sessions so far. Goal progressing for improving shortness of breath with ADL's. Abrey is currently able to maintain sats >88% on RA while exercising. She is currently exercising on the elliptical and the treadmill. Goal progressing for  developing more efficient breathing techniques such as purse lipped breathing and diaphragmatic breathing; and practicing self-pacing with activity. We will continue to monitor Windie's progress throughout the program.    Expected Outcomes To improve shortness of breath with ADL's and develop more efficient breathing techniques such as purse lipped breathing and diaphragmatic breathing; and practicing self-pacing with activity.             ITP Comments: Pt is making expected progress toward Pulmonary Rehab goals after completing 4 session(s). Recommend continued exercise, life style modification, education, and utilization of breathing techniques to increase stamina and strength, while also decreasing shortness of breath with exertion.  Dr. Mechele Collin is Medical Director for Pulmonary Rehab at [facility]

## 2024-01-04 ENCOUNTER — Encounter (HOSPITAL_COMMUNITY)
Admission: RE | Admit: 2024-01-04 | Discharge: 2024-01-04 | Disposition: A | Discharge status: Home / Self Care | Payer: Medicare Other | Source: Ambulatory Visit | Attending: Internal Medicine | Admitting: Internal Medicine

## 2024-01-04 DIAGNOSIS — R0609 Other forms of dyspnea: Secondary | ICD-10-CM | POA: Diagnosis not present

## 2024-01-04 NOTE — Progress Notes (Signed)
 Daily Session Note  Patient Details  Name: [name] MRN: [id number] Date of Birth: 08/31/1951 Referring Provider:   Doristine Devoid Pulmonary Rehab Walk Test from 12/15/2023 in [facility] for Heart, Vascular, & Lung Health  Referring Provider [name]       Encounter Date: 01/04/2024  Check In:  Session Check In - 01/04/24 1320       Check-In   Supervising physician immediately available to respond to emergencies [facility] MD immediately available    Physician(s) [name] Littler, NP    Location [facility]    Staff Present [name] BS, ACSM-CEP, Exercise Physiologist;[name] Earlene Plater, MS, ACSM-CEP, Exercise Physiologist;[name] [name] Innocent, RN, BSN;[name], RD, LDN    Virtual Visit No    Medication changes reported     No    Fall or balance concerns reported    No    Tobacco Cessation No Change    Warm-up and Cool-down Performed as group-led instruction    Resistance Training Performed Yes    VAD Patient? No    PAD/SET Patient? No      Pain Assessment   Currently in Pain? No/denies    Multiple Pain Sites No             Capillary Blood Glucose: No results found for this or any previous visit (from the past 24 hours).    Social History   Tobacco Use  Smoking Status Never  Smokeless Tobacco Never    Goals Met:  Proper associated with RPD/PD & O2 Sat Independence with exercise equipment Exercise tolerated well No report of concerns or symptoms today Strength training completed today  Goals Unmet:  Not Applicable  Comments: Service time is from 1316 to 1428.    Dr. Mechele [name] is Medical Director for Pulmonary Rehab at [facility].

## 2024-01-09 ENCOUNTER — Encounter (HOSPITAL_COMMUNITY)
Admission: RE | Admit: 2024-01-09 | Discharge: 2024-01-09 | Disposition: A | Discharge status: Home / Self Care | Payer: Medicare Other | Source: Ambulatory Visit | Attending: Internal Medicine | Admitting: Internal Medicine

## 2024-01-09 DIAGNOSIS — R0609 Other forms of dyspnea: Secondary | ICD-10-CM

## 2024-01-09 NOTE — Progress Notes (Signed)
 Daily Session Note  Patient Details  Name: [name] MRN: [id number] Date of Birth: 05/27/1951 Referring Provider:   Doristine Devoid Pulmonary Rehab Walk Test from 12/15/2023 in [facility] for Heart, Vascular, & Lung Health  Referring Provider Lynnette Caffey       Encounter Date: 01/09/2024  Check In:  Session Check In - 01/09/24 1316       Check-In   Supervising physician immediately available to respond to emergencies [facility] MD immediately available    Physician(s) Eligha Bridegroom, NP    [facility]    Staff Present Elissa Lovett BS, ACSM-CEP, Exercise Physiologist;Kaylee Earlene Plater, MS, ACSM-CEP, Exercise Physiologist;Nikisha Fleece Synthia Innocent, RN, BSN;Samantha Belarus, RD, LDN;Johnny Hale Bogus, MS, Exercise Physiologist    Virtual Visit No    Medication changes reported     No    Fall or balance concerns reported    No    Tobacco Cessation No Change    Warm-up and Cool-down Performed as group-led instruction    Resistance Training Performed Yes    VAD Patient? No    PAD/SET Patient? No      Pain Assessment   Currently in Pain? No/denies    Multiple Pain Sites No             Capillary Blood Glucose: No results found for this or any previous visit (from the past 24 hours).    Social History   Tobacco Use  Smoking Status Never  Smokeless Tobacco Never    Goals Met:  Proper associated with RPD/PD & O2 Sat Independence with exercise equipment Exercise tolerated well No report of concerns or symptoms today Strength training completed today  Goals Unmet:  Not Applicable  Comments: Service time is from 1311 to 1442.    Dr. Mechele Collin is Medical Director for Pulmonary Rehab at [facility].

## 2024-01-11 ENCOUNTER — Encounter (HOSPITAL_COMMUNITY)
Admission: RE | Admit: 2024-01-11 | Discharge: 2024-01-11 | Disposition: A | Discharge status: Home / Self Care | Payer: Medicare Other | Source: Ambulatory Visit | Attending: Internal Medicine | Admitting: Internal Medicine

## 2024-01-11 DIAGNOSIS — R0609 Other forms of dyspnea: Secondary | ICD-10-CM

## 2024-01-11 NOTE — Progress Notes (Signed)
 Daily Session Note  Patient Details  Name: [name] MRN: [id number] Date of Birth: 1951-06-30 Referring Provider:   Doristine Devoid Pulmonary Rehab Walk Test from 12/15/2023 in [facility] for Heart, Vascular, & Lung Health  Referring Provider Lynnette Caffey       Encounter Date: 01/11/2024  Check In:  Session Check In - 01/11/24 1332       Check-In   Supervising physician immediately available to respond to emergencies [facility] immediately available    Physician(s) Jari Favre, PA    [facility]    Staff Present Elissa Lovett BS, ACSM-CEP, Exercise Physiologist;Kaylee Earlene Plater, MS, ACSM-CEP, Exercise Physiologist;Casey Hermine Messick Belarus, RD, Dutch Gray, RN, Cathlean Cower, MS, Exercise Physiologist    Virtual Visit No    Medication changes reported     No    Fall or balance concerns reported    No    Tobacco Cessation No Change    Warm-up and Cool-down Performed as group-led instruction    Resistance Training Performed Yes    VAD Patient? No    PAD/SET Patient? No      Pain Assessment   Currently in Pain? No/denies    Multiple Pain Sites No             Capillary Blood Glucose: No results found for this or any previous visit (from the past 24 hours).    Social History   Tobacco Use  Smoking Status Never  Smokeless Tobacco Never    Goals Met:  Independence with exercise equipment Exercise tolerated well No report of concerns or symptoms today Strength training completed today  Goals Unmet:  Not Applicable  Comments: Service time is from 1313 to 1449    Dr. Mechele Collin is Medical Director for Pulmonary Rehab at [facility].

## 2024-01-12 ENCOUNTER — Telehealth (HOSPITAL_COMMUNITY): Payer: Self-pay

## 2024-01-12 NOTE — Telephone Encounter (Signed)
 Dr. Lynnette Caffey,  I am requesting target heart rate range increase for [name]. New THR is 60-130. Please advise.  Thanks [employer]

## 2024-01-16 ENCOUNTER — Encounter (HOSPITAL_COMMUNITY)
Admission: RE | Admit: 2024-01-16 | Discharge: 2024-01-16 | Disposition: A | Discharge status: Home / Self Care | Payer: Medicare Other | Source: Ambulatory Visit | Attending: Internal Medicine | Admitting: Internal Medicine

## 2024-01-16 VITALS — Wt 161.6 lb

## 2024-01-16 DIAGNOSIS — R0609 Other forms of dyspnea: Secondary | ICD-10-CM | POA: Insufficient documentation

## 2024-01-16 NOTE — Progress Notes (Signed)
 Daily Session Note  Patient Details  Name: [name] MRN: [id number] Date of Birth: 1951/05/28 Referring Provider:   Doristine Devoid Pulmonary Rehab Walk Test from 12/15/2023 in [facility] for Heart, Vascular, & Lung Health  Referring Provider Lynnette Caffey       Encounter Date: 01/16/2024  Check In:  Session Check In - 01/16/24 1501       Check-In   Supervising physician immediately available to respond to emergencies [facility] MD immediately available    Physician(s) Jari Favre, PA    [facility]    Staff Present Elissa Lovett BS, ACSM-CEP, Exercise Physiologist;Ardath Lepak Earlene Plater, MS, ACSM-CEP, Exercise Physiologist;Casey Hermine Messick Belarus, RD, Dutch Gray, RN, BSN    Virtual Visit No    Medication changes reported     No    Fall or balance concerns reported    No    Tobacco Cessation No Change    Warm-up and Cool-down Performed as group-led instruction    Resistance Training Performed Yes    VAD Patient? No    PAD/SET Patient? No      Pain Assessment   Currently in Pain? No/denies    Multiple Pain Sites No             Capillary Blood Glucose: No results found for this or any previous visit (from the past 24 hours).   Exercise Prescription Changes - 01/16/24 1500       Response to Exercise   Blood Pressure (Admit) 108/64    Blood Pressure (Exercise) 130/60    Blood Pressure (Exit) 110/60    Heart Rate (Admit) 74 bpm    Heart Rate (Exercise) 122 bpm    Heart Rate (Exit) 87 bpm    Oxygen Saturation (Admit) 98 %    Oxygen Saturation (Exercise) 93 %    Oxygen Saturation (Exit) 97 %    Rating of Perceived Exertion (Exercise) 11    Perceived Dyspnea (Exercise) 1    Duration Continue with 30 min of aerobic exercise without signs/symptoms of physical distress.    Intensity THRR unchanged      Resistance Training   Training Prescription Yes    Weight black bands    Reps 10-15    Time 10 Minutes      Treadmill    MPH 3    Grade 3.4    Minutes 15    METs 4.9      Elliptical   Level 3    Speed 2    Minutes 15    METs 6             Social History   Tobacco Use  Smoking Status Never  Smokeless Tobacco Never    Goals Met:  Proper associated with RPD/PD & O2 Sat Independence with exercise equipment Exercise tolerated well No report of concerns or symptoms today Strength training completed today  Goals Unmet:  Not Applicable  Comments: Service time is from 1313 to 1449.    Dr. Mechele Collin is Medical Director for Pulmonary Rehab at [facility].

## 2024-01-18 ENCOUNTER — Encounter (HOSPITAL_COMMUNITY)
Admission: RE | Admit: 2024-01-18 | Discharge: 2024-01-18 | Disposition: A | Discharge status: Home / Self Care | Payer: Medicare Other | Source: Ambulatory Visit | Attending: Internal Medicine

## 2024-01-18 DIAGNOSIS — R0609 Other forms of dyspnea: Secondary | ICD-10-CM

## 2024-01-18 NOTE — Progress Notes (Signed)
 Daily Session Note  Patient Details  Name: [name] MRN: [id number] Date of Birth: [birth year] Referring Provider:   Doristine Devoid Pulmonary Rehab Walk Test from 12/15/2023 in [facility] for Heart, Vascular, & Lung Health  Referring Provider Lynnette Caffey       Encounter Date: 01/18/2024  Check In:  Session Check In - 01/18/24 1343       Check-In   Supervising physician immediately available to respond to emergencies [facility] immediately available    Physician(s) Carlyon Shadow, NP    [facility]    Staff Present Elissa Lovett BS, ACSM-CEP, Exercise Physiologist;Kaylee Earlene Plater, MS, ACSM-CEP, Exercise Physiologist;Aanchal Cope Hermine Messick Belarus, RD, Dutch Gray, RN, BSN    Virtual Visit No    Medication changes reported     No    Fall or balance concerns reported    No    Tobacco Cessation No Change    Warm-up and Cool-down Performed as group-led instruction    Resistance Training Performed Yes    VAD Patient? No    PAD/SET Patient? No      Pain Assessment   Currently in Pain? No/denies    Multiple Pain Sites No             Capillary Blood Glucose: No results found for this or any previous visit (from the past 24 hours).    Social History   Tobacco Use  Smoking Status Never  Smokeless Tobacco Never    Goals Met:  Proper associated with RPD/PD & O2 Sat Independence with exercise equipment Exercise tolerated well No report of concerns or symptoms today Strength training completed today  Goals Unmet:  Not Applicable  Comments: Service time is from 1308 to 1450.    Dr. Mechele Collin is Medical Director for Pulmonary Rehab at [facility].

## 2024-01-19 ENCOUNTER — Other Ambulatory Visit: Payer: Self-pay | Admitting: Internal Medicine

## 2024-01-19 DIAGNOSIS — E785 Hyperlipidemia, unspecified: Secondary | ICD-10-CM | POA: Diagnosis not present

## 2024-01-19 DIAGNOSIS — Z79899 Other long term (current) drug therapy: Secondary | ICD-10-CM | POA: Diagnosis not present

## 2024-01-19 DIAGNOSIS — I5022 Chronic systolic (congestive) heart failure: Secondary | ICD-10-CM | POA: Diagnosis not present

## 2024-01-19 DIAGNOSIS — I428 Other cardiomyopathies: Secondary | ICD-10-CM | POA: Diagnosis not present

## 2024-01-20 LAB — BASIC METABOLIC PANEL WITH GFR
BUN/Creatinine Ratio: 29 — ABNORMAL HIGH (ref 12–28)
BUN: 27 mg/dL (ref 8–27)
CO2: 20 mmol/L (ref 20–29)
Calcium: 9.5 mg/dL (ref 8.7–10.3)
Chloride: 105 mmol/L (ref 96–106)
Creatinine, Ser: 0.94 mg/dL (ref 0.57–1.00)
Glucose: 104 mg/dL — ABNORMAL HIGH (ref 70–99)
Potassium: 4.4 mmol/L (ref 3.5–5.2)
Sodium: 141 mmol/L (ref 134–144)
eGFR: 65 mL/min/{1.73_m2} (ref >59)

## 2024-01-20 LAB — LIPID PANEL
Chol/HDL Ratio: 2.9 ratio (ref 0.0–4.4)
Cholesterol, Total: 144 mg/dL (ref 100–199)
HDL: 50 mg/dL (ref >39)
LDL Chol Calc (NIH): 71 mg/dL (ref 0–99)
Triglycerides: 132 mg/dL (ref 0–149)
VLDL Cholesterol Cal: 23 mg/dL (ref 5–40)

## 2024-01-22 ENCOUNTER — Encounter: Payer: Self-pay | Admitting: Internal Medicine

## 2024-01-22 ENCOUNTER — Encounter: Payer: Self-pay | Admitting: Physician Assistant

## 2024-01-23 ENCOUNTER — Encounter (HOSPITAL_COMMUNITY)
Admission: RE | Admit: 2024-01-23 | Discharge: 2024-01-23 | Disposition: A | Discharge status: Home / Self Care | Payer: Medicare Other | Source: Ambulatory Visit | Attending: Internal Medicine

## 2024-01-23 DIAGNOSIS — R0609 Other forms of dyspnea: Secondary | ICD-10-CM | POA: Diagnosis not present

## 2024-01-23 NOTE — Progress Notes (Signed)
 Daily Session Note  Patient Details  Name: [name] MRN: [id number] Date of Birth: 1951-10-04 Referring Provider:   Doristine Devoid Pulmonary Rehab Walk Test from 12/15/2023 in [place] [facility] for Heart, Vascular, & Lung Health  Referring Provider Lynnette Caffey       Encounter Date: 01/23/2024  Check In:  Session Check In - 01/23/24 1420       Check-In   Supervising physician immediately available to respond to emergencies [facility] MD immediately available    Physician(s) Rise Paganini, NP    [facility]    Staff Present Elissa Lovett BS, ACSM-CEP, Exercise Physiologist;Kaylee Earlene Plater, MS, ACSM-CEP, Exercise Physiologist;Casey Hermine Messick Belarus, RD, Dutch Gray, RN, BSN    Virtual Visit No    Medication changes reported     No    Fall or balance concerns reported    No    Tobacco Cessation No Change    Warm-up and Cool-down Performed as group-led instruction    Resistance Training Performed Yes    VAD Patient? No    PAD/SET Patient? No      Pain Assessment   Currently in Pain? No/denies    Multiple Pain Sites No             Capillary Blood Glucose: No results found for this or any previous visit (from the past 24 hours).    Social History   Tobacco Use  Smoking Status Never  Smokeless Tobacco Never    Goals Met:  Independence with exercise equipment Exercise tolerated well No report of concerns or symptoms today Strength training completed today  Goals Unmet:  Not Applicable  Comments: Service time is from 1310 to 1448    Dr. Mechele Collin is Medical Director for Pulmonary Rehab at [facility].

## 2024-01-25 ENCOUNTER — Encounter: Payer: Self-pay | Admitting: Internal Medicine

## 2024-01-25 ENCOUNTER — Ambulatory Visit: Payer: Medicare Other | Admitting: Physician Assistant

## 2024-01-25 ENCOUNTER — Encounter (HOSPITAL_COMMUNITY)
Admission: RE | Admit: 2024-01-25 | Discharge: 2024-01-25 | Disposition: A | Discharge status: Home / Self Care | Payer: Medicare Other | Source: Ambulatory Visit | Attending: Internal Medicine

## 2024-01-25 DIAGNOSIS — R0609 Other forms of dyspnea: Secondary | ICD-10-CM

## 2024-01-25 NOTE — Progress Notes (Signed)
 Daily Session Note  Patient Details  Name: [name] MRN: [id number] Date of Birth: 1951/04/04 Referring Provider:   Doristine Devoid Pulmonary Rehab Walk Test from 12/15/2023 in [facility] for Heart, Vascular, & Lung Health  Referring Provider Lynnette Caffey       Encounter Date: 01/25/2024  Check In:  Session Check In - 01/25/24 1331       Check-In   Supervising physician immediately available to respond to emergencies [facility] MD immediately available    Physician(s) Robin Searing, NP    [facility]    Staff Present Elissa Lovett BS, ACSM-CEP, Exercise Physiologist;Kaylee Earlene Plater, MS, ACSM-CEP, Exercise Physiologist;Casey Hermine Messick Belarus, RD, Dutch Gray, RN, BSN    Virtual Visit No    Medication changes reported     No    Fall or balance concerns reported    No    Tobacco Cessation No Change    Warm-up and Cool-down Performed as group-led instruction    Resistance Training Performed Yes    VAD Patient? No    PAD/SET Patient? No      Pain Assessment   Currently in Pain? No/denies    Multiple Pain Sites No             Capillary Blood Glucose: No results found for this or any previous visit (from the past 24 hours).    Social History   Tobacco Use  Smoking Status Never  Smokeless Tobacco Never    Goals Met:  Independence with exercise equipment Exercise tolerated well No report of concerns or symptoms today Strength training completed today  Goals Unmet:  Not Applicable  Comments: Service time is from 1322 to 1456    Dr. Mechele Collin is Medical Director for Pulmonary Rehab at [facility].

## 2024-01-30 ENCOUNTER — Encounter (HOSPITAL_COMMUNITY)
Admission: RE | Admit: 2024-01-30 | Discharge: 2024-01-30 | Disposition: A | Discharge status: Home / Self Care | Payer: Medicare Other | Source: Ambulatory Visit | Attending: Internal Medicine | Admitting: Internal Medicine

## 2024-01-30 VITALS — Wt 161.6 lb

## 2024-01-30 DIAGNOSIS — R0609 Other forms of dyspnea: Secondary | ICD-10-CM | POA: Diagnosis not present

## 2024-01-30 NOTE — Progress Notes (Signed)
 Cardiology Office Note:    Date:  02/12/2024   ID:  CHERREL KOHLENBERG, DOB May 09, 1951, MRN [id number]  PCP:  Jhon Moselle, FNP  Cardiologist:  Kyra Phy, MD {  Referring MD: Jhon Moselle, FNP   Chief Complaint: follow-up of fatigue and decreased exercise tolerance   History of Present Illness:    [name] is a 73 y.o. female with a history of normal coronaries on coronary CTA in 02/2022, non-ischemic cardiomyopathy/ chronic HFrEF with EF as low as 35-40% in 2023 but normalized to 60-65% on last Echo in 11/2023, AVNRT s/p ablation in 2015, mitral valve prolapse, hyperlipidemia, hypothyroidism, and GERD who is followed by Dr. [name] and presents today for follow-up of fatigue/ decreased exercise tolerance.   Patient a history of of AVNRT s/p ablation in 11/2013 with Dr. Carolynne Citron. She was previously seen by Dr. Berry Bristol in 11/2018 for atypical chest pain. However, coronary calcium  score in 10/2018 was 0 so no additional work-up was felt to be necessary. Echo in 01/2022 for further evaluation of worsening dyspnea on exertion and fatigue showed mildly reduced LVEF of 45-50% with grade 1 diastolic dysfunction, mild MR, and mid TR. She was admitted in 01/2022 for hyponatremia and acute CHF after presenting with generalized. She was diuresed with IV Lasix  with improvement in sodium level. She was discharged on PO Lasix  and was ultimately started on Metolazone  as well.  Outpatient coronary CTA showed a coronary calcium  score of 0 with no evidence of CAD as well as a small pericardial effusion and small bilateral pleural effusion. Monitor in 02/2022 for further evaluation of palpitations showed 5 short runs of non-sustained VT (longest run 12 beats) and 11 short runs of SVT (longest run 14.5 seconds) as well as occasional PVCs (2.2% burden) and rare PACs. Triggered events corresponded with sinus rhythm, NSVT, and PACs/ PVCs. She underwent RHC in 03/2022 due to persistent dyspnea which showed  normal cardiac output/ index with normal RA pressure and PA pressure but elevated wedge pressure of 24 mmHg with V waves to 38 mmHg. Repeat Echo later that month showed LVEF of 35-40% with global hypokinesis, grade 3 diastolic function, and elevated LVEDP, normal RV function, mild MR, and small pericardial effusion. She was started on GDMT but this was limited by low BP. Cardiac MRI in 06/2022 showed LVEF of 42% with small pericardial effusion but no evidence of infiltrative cardiomyopathy. Repeat Echo in 06/2022 showed LVEF of 35-40% with global hypokinesis, mild asymmetric LVH of the septal segment, and grade 1 diastolic dysfunction as well as mild to moderate MR. Monitor in 07/2022 showed 3 short runs of NSVT (longest run 5 beats) , one 4 beat run of SVT, rare PACs/ PVCs. 24 Hour BP monitor in 05/2023 due to problems with hypotension showed normal BP readings. EF has slowly improved over the last year.   She was last seen by Lovette Rud, PA-C, in 11/2023 at which time she reported increasing fatigue and also expressed concern about weight gain, hypotension, and high LDL levels. Heart rates were mildly elevated in the low 100s so Toprol -XL was increased. She was also started on Nexlizet . Repeat Echo was ordered due to fatigue and decreased ability to perform daily activities and showed LVEF of 60-65% with normal wall motion and grade 1 diastolic dysfunction, normal RV function, and only trivial MR. She started Pulmonary Rehab in 12/2023.  Patient presents today for follow-up.  Here alone.  Patient continues to report significant fatigue.  She  also does continues to have some decreased stamina and dyspnea on exertion when walking her dog on a slight incline but has noticed some improvement with Pulmonary Rehab.  She states she was able to walk through an airport terminal last month and did okay with this.  She denies any dyspnea at rest.  She states she never lays flat due to reflux but does not sound like she has  any true orthopnea.  No PND.  No lower extremity edema.  She states her weight is down about 4 pounds since starting Pulmonary Rehab.  She denies any chest pain.  She describes some irregular heartbeats at night but this is improved with the higher dose of Toprol -XL.  However no significant palpitations, lightheadedness, dizziness, syncope.  Her main complaint today is continued fatigue.  She continues to have difficulty sleeping at night due to insomnia and states she will often lay awake from 1 to 4 AM.  This is not new and has been going on for years.  Suspect this is a large contributing factor to her fatigue.  Overall, it sounds like she is stable from a cardiac standpoint.  EKGs/Labs/Other Studies Reviewed:    The following studies were reviewed:  Coronary CTA 03/08/2022: No evidence of CAD, CADRADS = 0. 2. Coronary calcium  score of 0. This was 0 percentile for age and sex matched control. 3. Normal coronary origin with right dominance. 4.  Small pericardial effusion.  Small bilateral pleural effusions. _______________  Right Cardiac Catheterization 04/07/2022: 1.  Cardiac output of 4.1 L/min and index of 2.6 L/min/m. 2.  Mean RA pressure of 1 mmHg, mean PA pressure of 20 mmHg, mean wedge pressure of 24 mmHg with V waves to 38 mmHg, and normal PA pulsatility index.   Recommendation: Given these findings and echocardiogram will be performed to evaluate for significant mitral regurgitation given elevated V waves. _______________  Cardiac MRI 06/29/2022: Impression: - LVEF 42%. - Small pericardial effusion. - No evidence of infiltrative cardiomyopathy. _______________  Monitor 07/2022: Patient had a min HR of 50 bpm, max HR of 179 bpm, and avg HR of 64 bpm. Predominant underlying rhythm was Sinus Rhythm.    Events: - 3 Ventricular Tachycardia runs occurred, the run with the fastest interval lasting 5 beats with a max rate of 179 bpm, the longest lasting 5 beats with an avg rate of  155 bpm. Some episodes of Ventricular Tachycardia may be Supraventricular Tachycardia with possible aberrancy.  - 1 run of Supraventricular Tachycardia occurred lasting 4 beats with a max rate of 107 bpm (avg 104 bpm).   - Idioventricular Rhythm was present. Idioventricular Rhythm was detected within +/- 45 seconds of symptomatic patient event(s).  - Isolated SVEs were rare (<1.0%), SVE Couplets were rare (<1.0%), and SVE Triplets were rare (<1.0%).  - Isolated VEs were rare (<1.0%, 1739), VE Couplets were rare (<1.0%, 15), and VE Triplets were rare (<1.0%, 1).   No atrial fibrillation, sustained ventricular tachyarrhythmias, or bradyarrhythmias were detected.   Patient triggered events corresponded with sinus rhythm, PVCs, and PACs. _______________  24 Hour Blood Pressure Monitor 05/2023: 1.  Overall blood pressure of 107/63 mmHg with a mean heart rate of 77 bpm. 2.  Awake blood pressure of 109/66 mmHg with a mean heart rate of 79 bpm. 3.  Nocturnal blood pressure of 101/56 mmHg with a mean heart rate of 78 bpm.   Summary: Normal blood pressure readings. _______________  Echocardiogram 12/14/2023: Impressions: 1. Left ventricular ejection fraction, by estimation, is  60 to 65%. The  left ventricle has normal function. The left ventricle has no regional  wall motion abnormalities. Left ventricular diastolic parameters are  consistent with Grade I diastolic  dysfunction (impaired relaxation).   2. Right ventricular systolic function is normal. The right ventricular  size is normal. There is normal pulmonary artery systolic pressure.   3. Left atrial size was moderately dilated.   4. There is no significant mitral valve prolapse. The mitral valve is  normal in structure. Trivial mitral valve regurgitation. No evidence of  mitral stenosis.   5. The aortic valve is tricuspid. Aortic valve regurgitation is not  visualized. No aortic stenosis is present.   6. The inferior vena cava is normal  in size with greater than 50%  respiratory variability, suggesting right atrial pressure of 3 mmHg.    EKG:  EKG ordered today.   EKG Interpretation Date/Time:  Monday February 12 2024 09:06:53 EDT Ventricular Rate:  73 PR Interval:  226 QRS Duration:  80 QT Interval:  394 QTC Calculation: 434 R Axis:   -23  Text Interpretation: Sinus rhythm with 1st degree A-V block Low voltage QRS Cannot rule out Anterior infarct (cited on or before 27-Jan-2022) Non-specific T wave changes. No significant changes compared to prior tracings. Confirmed by Tawn Fitzner [phone]) on 02/12/2024 9:09:52 AM    Recent Labs: 05/29/2023: NT-Pro BNP 163 01/19/2024: BUN 27; Creatinine, Ser 0.94; Potassium 4.4; Sodium 141  Recent Lipid Panel    Component Value Date/Time   CHOL 144 01/19/2024 1533   TRIG 132 01/19/2024 1533   HDL 50 01/19/2024 1533   CHOLHDL 2.9 01/19/2024 1533   CHOLHDL 5 01/18/2012 0910   VLDL 25.4 01/18/2012 0910   LDLCALC 71 01/19/2024 1533   LDLDIRECT 209.4 01/18/2012 0910    Physical Exam:    Vital Signs: BP 122/82 (BP Location: Right Arm, Patient Position: Sitting, Cuff Size: Normal)   Pulse 79   Ht 5\' 4"  (1.626 m)   Wt 161 lb 6.4 oz (73.2 kg)   SpO2 97%   BMI 27.70 kg/m     Wt Readings from Last 3 Encounters:  02/12/24 161 lb 6.4 oz (73.2 kg)  01/30/24 161 lb 9.6 oz (73.3 kg)  01/16/24 161 lb 9.6 oz (73.3 kg)     General: 73 y.o. Caucasian female in no acute distress. HEENT: Normocephalic and atraumatic. Sclera clear.  Neck: Supple. No carotid bruits. No JVD. Heart: RRR. Distinct S1 and S2. No murmurs, gallops, or rubs.  Lungs: No increased work of breathing. Clear to ausculation bilaterally. No wheezes, rhonchi, or rales.  Extremities: No lower extremity edema.  Radial pulses 2+ and equal bilaterally. Skin: Warm and dry. Neuro: No focal deficits. Psych: Normal affect. Responds appropriately.   Assessment:    1. Chronic fatigue   2. Decreased exercise tolerance    3. Chronic combined systolic and diastolic CHF (congestive heart failure) (HCC)   4. Non-ischemic cardiomyopathy (HCC)   5. Paroxysmal SVT (supraventricular tachycardia) (HCC)   6. NSVT (nonsustained ventricular tachycardia) (HCC)   7. Mitral valve insufficiency, unspecified etiology   8. Hyperlipidemia, unspecified hyperlipidemia type   9. Diabetes mellitus screening     Plan:    Fatigue Decreased Exercise Tolerance Patient continues to have significant fatigue.  She also describes some decreased stamina/exercise tolerance but does seem to have had some improvement with Pulmonary Rehab.  She has significant insomnia and will often lie awake from 1 AM to 4 AM which I suspect is playing  a large role in her fatigue.  She is also going to be working with an endocrinologist in regards to her thyroid  disease.  She has had a thorough cardiac workup for similar symptoms over the last couple years.  I do not think symptoms are cardiac in nature at this time.  No additional cardiac workup necessary right now; however, we could consider a CPX in the future if symptoms worsen.  Non-Ischemic Cardiomyopathy Chronic Combined CHF Patient has a history of non-ischemic cardiomyopathy. Echo in 03/2022 showed LVEF of 35-40% and grade 3 diastolic dysfunction. RHC around this time showed normal cardiac output/ index with normal RA pressure and PA pressure but elevated wedge pressure of 24 mmHg with V waves to 38 mmHg. Cardiac MRI in 06/2022 showed LVEF of 42% with small pericardial effusion but no evidence of infiltrative cardiomyopathy. EF has subsequently improved. Last Echo in 11/2023 showed LVEF of 60-65% with normal wall motion and grade 1 diastolic dysfunction, normal RV function, and only trivial MR.  - Euvolemic on exam.  - GDMT has been limited by hypotension. Only on Toprol -XL 25mg  daily.   Paroxysmal SVT Non-Sustained VT Patient has a history of AVNRT s/p ablation in 2015. She has had recurrent  palpitations over the last couple of years. Repeat monitor in 02/2022 and 07/2022 showed short runs of paroxysmal SVT and non-sustained VT but no significant arrhythmias/ sustained arrhythmias.  - She still reports some irregular heartbeats at night but overall symptoms improved with higher dose of beta-blocker. - Continue Toprol -XL 25mg  daily.   Mitral Regurgitation Prior Echo in 06/2022 showed mild to moderate MR. However, most recent Echo in 11/2023 showed on trivial MR.  Hyperlipidemia Recent lipid panel on 01/19/2024: Total Cholesterol  144, Triglycerides 132, HDL 50, LDL 71. LDL significant improved from >200 in 10/2023 after starting Nexlizet . Coronary calcium  score 0 in 2023 but she does have a strong family history of CAD.  - She has been unable to tolerate statins in the past due to myalgias.  - Currently on Nexlizet  180-10mg  daily.  She describes some diarrhea with this which improved when switching to every other day.  Okay to continue every other day.  - Will plan to repeat lipid panel and LFTs in 3 months.  She would also like to check a hemoglobin A1c at that time which we can do.  Disposition: Follow up in 6 months.    Signed, Jadalynn Burr E Sugey Trevathan, PA-C  02/12/2024 9:51 AM    [facility]

## 2024-01-30 NOTE — Progress Notes (Signed)
 Daily Session Note  Patient Details  Name: [name] MRN: [id number] Date of Birth: 07-05-1951 Referring Provider:   Gattis Kass Pulmonary Rehab Walk Test from 12/15/2023 in [facility]  Referring Provider [name]       Encounter Date: 01/30/2024  Check In:  Session Check In - 01/30/24 1325       Check-In   Supervising physician immediately available to respond to emergencies [facility] MD immediately available    Physician(s) Palmer Bobo, NP    Location [facility]    Staff Present Sueellen Emery BS, ACSM-CEP, Exercise Physiologist;Kaylee Nolon Baxter, MS, ACSM-CEP, Exercise Physiologist;Juaquina Machnik Carmen Chol, RN, BSN    Virtual Visit No    Medication changes reported     No    Fall or balance concerns reported    No    Tobacco Cessation No Change    Warm-up and Cool-down Performed as group-led instruction    Resistance Training Performed Yes    VAD Patient? No    PAD/SET Patient? No      Pain Assessment   Currently in Pain? No/denies    Multiple Pain Sites No             Capillary Blood Glucose: No results found for this or any previous visit (from the past 24 hours).   Exercise Prescription Changes - 01/30/24 1500       Response to Exercise   Blood Pressure (Admit) 118/62    Blood Pressure (Exercise) 134/74    Blood Pressure (Exit) 110/70    Heart Rate (Admit) 76 bpm    Heart Rate (Exercise) 111 bpm    Heart Rate (Exit) 86 bpm    Oxygen Saturation (Admit) 98 %    Oxygen Saturation (Exercise) 97 %    Oxygen Saturation (Exit) 98 %    Rating of Perceived Exertion (Exercise) 10    Perceived Dyspnea (Exercise) 1    Duration Continue with 30 min of aerobic exercise without signs/symptoms of physical distress.    Intensity THRR unchanged      Resistance Training   Training Prescription Yes    Weight black bands    Reps 10-15    Time 10 Minutes      Elliptical   Level 5     Speed 2    Minutes 15    METs 7             Social History   Tobacco Use  Smoking Status Never  Smokeless Tobacco Never    Goals Met:  Proper associated with RPD/PD & O2 Sat Independence with exercise equipment Exercise tolerated well No report of concerns or symptoms today Strength training completed today  Goals Unmet:  Not Applicable  Comments: Service time is from 1315 to 1441.    Dr. Genetta Kenning is Medical Director for Pulmonary Rehab at [facility].

## 2024-01-31 NOTE — Progress Notes (Signed)
 Pulmonary Individual Treatment Plan  Patient Details  Name: [name] MRN: [id number] Date of Birth: 1951/04/09 Referring Provider:   Doristine Devoid Pulmonary Rehab Walk Test from 12/15/2023 in [facility]  Referring Provider Thukkani       Initial Encounter Date:  Flowsheet Row Pulmonary Rehab Walk Test from 12/15/2023 in [facility] for Heart, [facility]ascular, [facility] Lung [facility]  Date 12/15/23       Visit Diagnosis: DOE (dyspnea on exertion)  Patient's Home Medications on Admission:   Current Outpatient Medications:    Bempedoic Acid-Ezetimibe (NEXLIZET) 180-10 MG TABS, Take 1 tablet by mouth at bedtime., Disp: 90 tablet, Rfl: 3   Cholecalciferol (VITAMIN D3) 50 MCG (2000 UT) capsule, Take 2,000 Units by mouth daily., Disp: , Rfl:    ciclopirox (LOPROX) 0.77 % cream, Apply 1 application. topically 2 (two) times daily as needed (foot fungus)., Disp: , Rfl:    clotrimazole (LOTRIMIN) 1 % cream, as needed., Disp: , Rfl:    metoprolol succinate (TOPROL-XL) 25 MG 24 hr tablet, Take 1 tablet (25 mg total) by mouth daily. Pt takes 1/2 tablet once a day., Disp: 90 tablet, Rfl: 3   Multiple Vitamins-Minerals (MULTIVITAMINS THER. W/MINERALS) TABS tablet, Take 1 tablet by mouth daily., Disp: , Rfl:    SYNTHROID 25 MCG tablet, Take 25 mcg by mouth. As directed, Disp: , Rfl:   Past Medical History: Past Medical History:  Diagnosis Date   Adenomatous polyp    Allergic rhinitis    Anxiety    AV Nodal Reentry Tachycardia    s/p RFCA GT 2/15   AVNRT (AV nodal re-entry tachycardia) (HCC) 11/06/2013   Calcified granuloma of lung    Cataract    CHF (congestive heart failure) (HCC)    Diverticulosis 01/2020   Gastritis    GERD (gastroesophageal reflux disease)    H/O radiofrequency ablation for complex left atrial arrhythmia 12/12/2013   Headache    Hematuria    Hemorrhoids    HTN (hypertension)    Hyperlipemia     Hypothyroidism    Insomnia    Iron deficiency    Irregular heart beat    Laryngopharyngeal reflux (LPR)    Migraine    Mitral valve prolapse    ?   Rectal leakage    1 time   Retinal tear of both eyes    Skin lesion of breast    Vitamin D deficiency    Vulvodynia     Tobacco Use: Social History   Tobacco Use  Smoking Status Never  Smokeless Tobacco Never    Labs: Review Flowsheet       Latest Ref Rng & Units 12/29/2010 01/18/2012 04/07/2022 01/19/2024  Labs for ITP Cardiac and Pulmonary Rehab  Cholestrol 100 - 199 mg/dL - 409  - 811   LDL (calc) 0 - 99 mg/dL - - - 71   Direct LDL mg/dL - 914.7  - -  HDL-C >82 mg/dL - 95.62  - 50   Trlycerides 0 - 149 mg/dL - 130.8  - 657   Bicarbonate 20.0 - 28.0 mmol/L - - 24.6  25.2  25.4  25.7  24.0  25.6  -  TCO2 22 - 32 mmol/L 24  - 26  26  26  27  25  27   -  O2 Saturation % - - 63  63  66  58  70  57  -    Details  Multiple values from one day are sorted in reverse-chronological order         Capillary Blood Glucose: No results found for: "GLUCAP"   Pulmonary Assessment Scores:  Pulmonary Assessment Scores     Row Name 12/15/23 1307         ADL UCSD   ADL Phase Entry     SOB Score total 42       CAT Score   CAT Score 20       mMRC Score   mMRC Score 3             UCSD: Self-administered rating of dyspnea associated with activities of daily living (ADLs) 6-point scale (0 = "not at all" to 5 = "maximal or unable to do because of breathlessness")  Scoring Scores range from 0 to 120.  Minimally important difference is 5 units  CAT: CAT can identify the health impairment of COPD patients and is better correlated with disease progression.  CAT has a scoring range of zero to 40. The CAT score is classified into four groups of low (less than 10), medium (10 - 20), high (21-30) and very high (31-40) based on the impact level of disease on health status. A CAT score over 10 suggests significant symptoms.   A worsening CAT score could be explained by an exacerbation, poor medication adherence, poor inhaler technique, or progression of COPD or comorbid conditions.  CAT MCID is 2 points  mMRC: mMRC (Modified Medical Research Council) Dyspnea Scale is used to assess the degree of baseline functional disability in patients of respiratory disease due to dyspnea. No minimal important difference is established. A decrease in score of 1 point or greater is considered a positive change.   Pulmonary Function Assessment:  Pulmonary Function Assessment - 12/15/23 1431       Breath   Bilateral Breath Sounds Clear    Shortness of Breath Fear of Shortness of Breath             Exercise Target Goals: Exercise Program Goal: Individual exercise prescription set using results from initial 6 min walk test and THRR while considering  patient's activity barriers and safety.   Exercise Prescription Goal: Initial exercise prescription builds to 30-45 minutes a day of aerobic activity, 2-3 days per week.  Home exercise guidelines will be given to patient during program as part of exercise prescription that the participant will acknowledge.  Activity Barriers & Risk Stratification:  Activity Barriers & Cardiac Risk Stratification - 12/15/23 1322       Activity Barriers & Cardiac Risk Stratification   Activity Barriers Deconditioning;Muscular Weakness;Shortness of Breath    Cardiac Risk Stratification Moderate             6 Minute Walk:  6 Minute Walk     Row Name 12/15/23 1418         6 Minute Walk   Phase Initial     Distance 1680 feet     Walk Time 6 minutes     # of Rest Breaks 0     MPH 3.18     METS 3.46     RPE 13     Perceived Dyspnea  1     VO2 Peak 12.11     Symptoms No     Resting HR 79 bpm     Resting BP 122/80     Resting Oxygen Saturation  98 %     Exercise Oxygen Saturation  during 6 min walk 96 %  Max Ex. HR 122 bpm     Max Ex. BP 164/82     2 Minute Post BP  132/80       Interval HR   1 Minute HR 104     2 Minute HR 114     3 Minute HR 117     4 Minute HR 118     5 Minute HR 121     6 Minute HR 122     2 Minute Post HR 87     Interval Heart Rate? Yes       Interval Oxygen   Interval Oxygen? Yes     Baseline Oxygen Saturation % 98 %     1 Minute Oxygen Saturation % 97 %     1 Minute Liters of Oxygen 0 L     2 Minute Oxygen Saturation % 97 %     2 Minute Liters of Oxygen 0 L     3 Minute Oxygen Saturation % 97 %     3 Minute Liters of Oxygen 0 L     4 Minute Oxygen Saturation % 98 %     4 Minute Liters of Oxygen 0 L     5 Minute Oxygen Saturation % 96 %     5 Minute Liters of Oxygen 0 L     6 Minute Oxygen Saturation % 96 %     6 Minute Liters of Oxygen 0 L     2 Minute Post Oxygen Saturation % 99 %     2 Minute Post Liters of Oxygen 0 L              Oxygen Initial Assessment:  Oxygen Initial Assessment - 12/15/23 1321       Home Oxygen   Home Oxygen Device None    Sleep Oxygen Prescription None    Home Exercise Oxygen Prescription None    Home Resting Oxygen Prescription None      Initial 6 min Walk   Oxygen Used None      Program Oxygen Prescription   Program Oxygen Prescription None      Intervention   Short Term Goals To learn and understand importance of maintaining oxygen saturations>88%;To learn and understand importance of monitoring SPO2 with pulse oximeter and demonstrate accurate use of the pulse oximeter.;To learn and demonstrate proper pursed lip breathing techniques or other breathing techniques.     Long  Term Goals Verbalizes importance of monitoring SPO2 with pulse oximeter and return demonstration;Maintenance of O2 saturations>88%;Exhibits proper breathing techniques, such as pursed lip breathing or other method taught during program session             Oxygen Re-Evaluation:  Oxygen Re-Evaluation     Row Name 12/29/23 0837 01/26/24 0716           Program Oxygen Prescription   Program  Oxygen Prescription None None        Home Oxygen   Home Oxygen Device None None      Sleep Oxygen Prescription None None      Home Exercise Oxygen Prescription None None      Home Resting Oxygen Prescription None None        Goals/Expected Outcomes   Short Term Goals To learn and understand importance of maintaining oxygen saturations>88%;To learn and understand importance of monitoring SPO2 with pulse oximeter and demonstrate accurate use of the pulse oximeter.;To learn and demonstrate proper pursed lip breathing techniques or other breathing techniques.  To learn  and understand importance of maintaining oxygen saturations>88%;To learn and understand importance of monitoring SPO2 with pulse oximeter and demonstrate accurate use of the pulse oximeter.;To learn and demonstrate proper pursed lip breathing techniques or other breathing techniques.       Long  Term Goals Verbalizes importance of monitoring SPO2 with pulse oximeter and return demonstration;Maintenance of O2 saturations>88%;Exhibits proper breathing techniques, such as pursed lip breathing or other method taught during program session Verbalizes importance of monitoring SPO2 with pulse oximeter and return demonstration;Maintenance of O2 saturations>88%;Exhibits proper breathing techniques, such as pursed lip breathing or other method taught during program session      Goals/Expected Outcomes Compliance and understanding of oxygen saturation monitoring and breath techniques to decrease shortness of breath. Compliance and understanding of oxygen saturation monitoring and breath techniques to decrease shortness of breath.               Oxygen Discharge (Final Oxygen Re-Evaluation):  Oxygen Re-Evaluation - 01/26/24 0716       Program Oxygen Prescription   Program Oxygen Prescription None      Home Oxygen   Home Oxygen Device None    Sleep Oxygen Prescription None    Home Exercise Oxygen Prescription None    Home Resting Oxygen  Prescription None      Goals/Expected Outcomes   Short Term Goals To learn and understand importance of maintaining oxygen saturations>88%;To learn and understand importance of monitoring SPO2 with pulse oximeter and demonstrate accurate use of the pulse oximeter.;To learn and demonstrate proper pursed lip breathing techniques or other breathing techniques.     Long  Term Goals Verbalizes importance of monitoring SPO2 with pulse oximeter and return demonstration;Maintenance of O2 saturations>88%;Exhibits proper breathing techniques, such as pursed lip breathing or other method taught during program session    Goals/Expected Outcomes Compliance and understanding of oxygen saturation monitoring and breath techniques to decrease shortness of breath.             Initial Exercise Prescription:  Initial Exercise Prescription - 12/15/23 1400       Date of Initial Exercise RX and Referring Provider   Date 12/15/23    Referring Provider Thukkani    Expected Discharge Date 12/15/23      Treadmill   MPH 2.8    Grade 0    Minutes 15    METs 3.14      Elliptical   Level 1    Speed 1    Minutes 15    METs 3.14      Prescription Details   Frequency (times per week) 2    Duration Progress to 30 minutes of continuous aerobic without signs/symptoms of physical distress      Intensity   THRR 40-80% of Max Heartrate 60-119    Ratings of Perceived Exertion 11-13    Perceived Dyspnea 0-4      Progression   Progression Continue to progress workloads to maintain intensity without signs/symptoms of physical distress.      Resistance Training   Training Prescription Yes    Weight blue bands    Reps 10-15             Perform Capillary Blood Glucose checks as needed.  Exercise Prescription Changes:   Exercise Prescription Changes     Row Name 01/02/24 1500 01/16/24 1500 01/30/24 1500         Response to Exercise   Blood Pressure (Admit) 128/80 108/64 118/62     Blood Pressure  (Exercise) 140/78  130/60 134/74     Blood Pressure (Exit) 120/70 110/60 110/70     Heart Rate (Admit) 80 bpm 74 bpm 76 bpm     Heart Rate (Exercise) 115 bpm 122 bpm 111 bpm     Heart Rate (Exit) 96 bpm 87 bpm 86 bpm     Oxygen Saturation (Admit) 99 % 98 % 98 %     Oxygen Saturation (Exercise) 95 % 93 % 97 %     Oxygen Saturation (Exit) 97 % 97 % 98 %     Rating of Perceived Exertion (Exercise) 11 11 10      Perceived Dyspnea (Exercise) 1 1 1      Duration Continue with 30 min of aerobic exercise without signs/symptoms of physical distress. Continue with 30 min of aerobic exercise without signs/symptoms of physical distress. Continue with 30 min of aerobic exercise without signs/symptoms of physical distress.     Intensity THRR unchanged THRR unchanged THRR unchanged       Resistance Training   Training Prescription Yes Yes Yes     Weight black bands black bands black bands     Reps 10-15 10-15 10-15     Time 10 Minutes 10 Minutes 10 Minutes       Treadmill   MPH -- 3 --     Grade -- 3.4 --     Minutes -- 15 --     METs -- 4.9 --       Elliptical   Level 3 3 5      Speed 2 2 2      Minutes 15 15 15      METs 5.2 6 7               Exercise Comments:   Exercise Comments     Row Name 12/21/23 1521           Exercise Comments Pt completed first day of exericse. [name] exercised for 15 min on the upright elliptical and treadmill. [name] averaged 4.6 METs at level and speed 1 on the upright elliptical and 4.3 METs at 3.5 mph and 3.2% incline on the treadmill. [name] performed the warmup and cooldown standing without limitations. Discussed METs. Pt voiced understanding.                Exercise Goals and Review:   Exercise Goals     Row Name 12/15/23 1322             Exercise Goals   Increase Physical Activity Yes       Intervention Provide advice, education, support and counseling about physical activity/exercise needs.;Develop an individualized exercise prescription  for aerobic and resistive training based on initial evaluation findings, risk stratification, comorbidities and participant's personal goals.       Expected Outcomes Short Term: Attend rehab on a regular basis to increase amount of physical activity.;Long Term: Add in home exercise to make exercise part of routine and to increase amount of physical activity.;Long Term: Exercising regularly at least 3-5 days a week.       Increase Strength and Stamina Yes       Intervention Provide advice, education, support and counseling about physical activity/exercise needs.;Develop an individualized exercise prescription for aerobic and resistive training based on initial evaluation findings, risk stratification, comorbidities and participant's personal goals.       Expected Outcomes Short Term: Increase workloads from initial exercise prescription for resistance, speed, and METs.;Short Term: Perform resistance training exercises routinely during rehab and add in resistance training  at home;Long Term: Improve cardiorespiratory fitness, muscular endurance and strength as measured by increased METs and functional capacity ( )       Able to understand and use rate of perceived exertion (RPE) scale Yes       Intervention Provide education and explanation on how to use RPE scale       Expected Outcomes Short Term: Able to use RPE daily in rehab to express subjective intensity level;Long Term:  Able to use RPE to guide intensity level when exercising independently       Able to understand and use Dyspnea scale Yes       Intervention Provide education and explanation on how to use Dyspnea scale       Expected Outcomes Short Term: Able to use Dyspnea scale daily in rehab to express subjective sense of shortness of breath during exertion;Long Term: Able to use Dyspnea scale to guide intensity level when exercising independently       Knowledge and understanding of Target Heart Rate Range (THRR) Yes       Intervention  Provide education and explanation of THRR including how the numbers were predicted and where they are located for reference       Expected Outcomes Short Term: Able to state/look up THRR;Long Term: Able to use THRR to govern intensity when exercising independently;Short Term: Able to use daily as guideline for intensity in rehab       Understanding of Exercise Prescription Yes       Intervention Provide education, explanation, and written materials on patient's individual exercise prescription       Expected Outcomes Short Term: Able to explain program exercise prescription;Long Term: Able to explain home exercise prescription to exercise independently                Exercise Goals Re-Evaluation :  Exercise Goals Re-Evaluation     Row Name 12/29/23 0837 01/26/24 0714           Exercise Goal Re-Evaluation   Exercise Goals Review Increase Physical Activity;Able to understand and use Dyspnea scale;Understanding of Exercise Prescription;Increase Strength and Stamina;Knowledge and understanding of Target Heart Rate Range (THRR);Able to understand and use rate of perceived exertion (RPE) scale Increase Physical Activity;Able to understand and use Dyspnea scale;Understanding of Exercise Prescription;Increase Strength and Stamina;Knowledge and understanding of Target Heart Rate Range (THRR);Able to understand and use rate of perceived exertion (RPE) scale      Comments Pt has completed 3 exercise sessions. She is motivated and tolerating well. She is exercising on the upright elliptical for 15 min, level 2, incline 1, METs 5.6. She then is walking on the treadmill for 15 min, 3.4 mph, 3% incline, METs 4.6. She completes warm up and cool down without limitations, currently using blue bands (5.8 lbs) but doubles the band for some exercises for 11 lbs. Will progress as tolerated. Pt has completed 11 exercise sessions. She is motivated and tolerating well. She is exercising on the upright elliptical for 30  min, level 3, incline 1, METs 6.1.  She perfers the elliptical for longer instead of doing two 15 min stations as she feels it is more challenging.  She completes warm up and cool down without limitations, currently using black bands (7.3 lbs). Will progress as tolerated.      Expected Outcomes Through exercise at rehab and home, the patient will decrease shortness of breath with daily activities and feel confident in carrying out an exercise regimen at home. Through exercise at rehab  and home, the patient will decrease shortness of breath with daily activities and feel confident in carrying out an exercise regimen at home.               Discharge Exercise Prescription (Final Exercise Prescription Changes):  Exercise Prescription Changes - 01/30/24 1500       Response to Exercise   Blood Pressure (Admit) 118/62    Blood Pressure (Exercise) 134/74    Blood Pressure (Exit) 110/70    Heart Rate (Admit) 76 bpm    Heart Rate (Exercise) 111 bpm    Heart Rate (Exit) 86 bpm    Oxygen Saturation (Admit) 98 %    Oxygen Saturation (Exercise) 97 %    Oxygen Saturation (Exit) 98 %    Rating of Perceived Exertion (Exercise) 10    Perceived Dyspnea (Exercise) 1    Duration Continue with 30 min of aerobic exercise without signs/symptoms of physical distress.    Intensity THRR unchanged      Resistance Training   Training Prescription Yes    Weight black bands    Reps 10-15    Time 10 Minutes      Elliptical   Level 5    Speed 2    Minutes 15    METs 7             Nutrition:  Target Goals: Understanding of nutrition guidelines, daily intake of sodium 1500mg , cholesterol 200mg , calories 30% from fat and 7% or less from saturated fats, daily to have 5 or more servings of fruits and vegetables.  Biometrics:  Pre Biometrics - 12/15/23 1430       Pre Biometrics   Grip Strength 22 kg              Nutrition Therapy Plan and Nutrition Goals:  Nutrition Therapy & Goals -  01/29/24 0857       Nutrition Therapy   Diet Heart Healthy Diet      Personal Nutrition Goals   Nutrition Goal Patient to improve diet quality by using the plate method as a guide for meal planning to include lean protein/plant protein, fruits, vegetables, whole grains, nonfat dairy as part of a well-balanced diet.   goal in progress   Personal Goal #2 Patient to identify strategies for weight loss of 0.5-2.0# per week.   goal in progress   Personal Goal #3 Patient to limit sodium intake to 2300mg  per day   goal in progress   Comments [name] has medical history of hyperlipidemia, CHF, DOE, hypothyroidism. [name] reports concern of weight gain; we discussed weight monitoring and reading food label for sodium. We further discussed strategies for weight loss including calorie density, decreasing calorie dense foods, protein supplements, etc. She is up ~8# over the last year (69.5kg at 02/06/23 visit). She does report drinking>32oz of 2% milk/chocolate milk daily. She reports that she has decreased eating ice cream over the last three weeks. She is down 4.2# since starting with our program.  Lipids are at goal. Patient will benefit from participation in pulmonary rehab for nutrition, exercise, and lifestyle modification.      Intervention Plan   Intervention Prescribe, educate and counsel regarding individualized specific dietary modifications aiming towards targeted core components such as weight, hypertension, lipid management, diabetes, heart failure and other comorbidities.;Nutrition handout(s) given to patient.    Expected Outcomes Short Term Goal: Understand basic principles of dietary content, such as calories, fat, sodium, cholesterol and nutrients.;Long Term Goal: Adherence to prescribed nutrition plan.  Nutrition Assessments:  Nutrition Assessments - 12/21/23 1621       Rate Your Plate Scores   Pre Score 53            MEDIFICTS Score Key: >=70 Need to make dietary  changes  40-70 Heart Healthy Diet <= 40 Therapeutic Level Cholesterol Diet  Flowsheet Row PULMONARY REHAB OTHER RESPIRATORY from 12/21/2023 in [facility]  Picture Your Plate Total Score on Admission 53      Picture Your Plate Scores: <16 Unhealthy dietary pattern with much room for improvement. 41-50 Dietary pattern unlikely to meet recommendations for good health and room for improvement. 51-60 More healthful dietary pattern, with some room for improvement.  >60 Healthy dietary pattern, although there may be some specific behaviors that could be improved.    Nutrition Goals Re-Evaluation:  Nutrition Goals Re-Evaluation     Row Name 12/26/23 1512 01/29/24 0857           Goals   Current Weight 165 lb 12.6 oz (75.2 kg) 161 lb 6 oz (73.2 kg)      Comment cholesterol 213, LDL 227 (allergy to pravastatin, rosuvastatin) lipids WNL(nexlizet), LDL 71      Expected Outcome [name] has medical history of hyperlipidemia, CHF, DOE, hypothyroidism. [name] reports concern of weight gain; we discussed weight monitoring and reading food label for sodium. We further discussed strategies for weight loss including calorie density, decreasing calorie dense foods, protein supplements, etc. She is up ~12.5# over the last year (69.5kg at 02/06/23 visit). She does report drinking>32oz of 2% milk/chocolate milk daily. She reports that she has decreased eating ice cream over the last three weeks. Patient will benefit from participation in pulmonary rehab for nutrition, exercise, and lifestyle modification. [name] has medical history of hyperlipidemia, CHF, DOE, hypothyroidism. Jolleen reports concern of weight gain; we discussed weight monitoring and reading food label for sodium. We further discussed strategies for weight loss including calorie density, decreasing calorie dense foods, protein supplements, etc. She is up ~8# over the last year (69.5kg at 02/06/23  visit). She does report drinking>32oz of 2% milk/chocolate milk daily. She reports that she has decreased eating ice cream over the last three weeks. She is down 4.2# since starting with our program. Lipids are at goal. Patient will benefit from participation in pulmonary rehab for nutrition, exercise, and lifestyle modification.               Nutrition Goals Discharge (Final Nutrition Goals Re-Evaluation):  Nutrition Goals Re-Evaluation - 01/29/24 0857       Goals   Current Weight 161 lb 6 oz (73.2 kg)    Comment lipids WNL(nexlizet), LDL 71    Expected Outcome [name] has medical history of hyperlipidemia, CHF, DOE, hypothyroidism. [name] reports concern of weight gain; we discussed weight monitoring and reading food label for sodium. We further discussed strategies for weight loss including calorie density, decreasing calorie dense foods, protein supplements, etc. She is up ~8# over the last year (69.5kg at 02/06/23 visit). She does report drinking>32oz of 2% milk/chocolate milk daily. She reports that she has decreased eating ice cream over the last three weeks. She is down 4.2# since starting with our program. Lipids are at goal. Patient will benefit from participation in pulmonary rehab for nutrition, exercise, and lifestyle modification.             Psychosocial: Target Goals: Acknowledge presence or absence of significant depression and/or stress, maximize coping skills,  provide positive support system. Participant is able to verbalize types and ability to use techniques and skills needed for reducing stress and depression.  Initial Review & Psychosocial Screening:  Initial Psych Review & Screening - 12/15/23 1317       Initial Review   Current issues with Current Stress Concerns    Source of Stress Concerns Chronic Illness    Comments fear of cardiac event with exercise      Family Dynamics   Good Support System? Yes    Comments friends and son      Barriers    Psychosocial barriers to participate in program The patient should benefit from training in stress management and relaxation.      Screening Interventions   Interventions Encouraged to exercise    Expected Outcomes Short Term goal: Utilizing psychosocial counselor, staff and physician to assist with identification of specific Stressors or current issues interfering with healing process. Setting desired goal for each stressor or current issue identified.;Long Term Goal: Stressors or current issues are controlled or eliminated.;Short Term goal: Identification and review with participant of any Quality of Life or Depression concerns found by scoring the questionnaire.;Long Term goal: The participant improves quality of Life and PHQ9 Scores as seen by post scores and/or verbalization of changes             Quality of Life Scores:  Scores of 19 and below usually indicate a poorer quality of life in these areas.  A difference of  2-3 points is a clinically meaningful difference.  A difference of 2-3 points in the total score of the Quality of Life Index has been associated with significant improvement in overall quality of life, self-image, physical symptoms, and general health in studies assessing change in quality of life.  PHQ-9: Review Flowsheet       12/15/2023 12/08/2016  Depression screen PHQ 2/9  Decreased Interest 0 0  Down, Depressed, Hopeless 1 0  PHQ - 2 Score 1 0  Altered sleeping 3 -  Tired, decreased energy 3 -  Change in appetite 0 -  Feeling bad or failure about yourself  0 -  Trouble concentrating 0 -  Moving slowly or fidgety/restless 0 -  Suicidal thoughts 0 -  PHQ-9 Score 7 -  Difficult doing work/chores Not difficult at all -   Interpretation of Total Score  Total Score Depression Severity:  1-4 = Minimal depression, 5-9 = Mild depression, 10-14 = Moderate depression, 15-19 = Moderately severe depression, 20-27 = Severe depression   Psychosocial Evaluation and  Intervention:  Psychosocial Evaluation - 12/15/23 1319       Psychosocial Evaluation & Interventions   Interventions Stress management education;Relaxation education;Encouraged to exercise with the program and follow exercise prescription    Expected Outcomes Pt to exercise to help relieve fear    Continue Psychosocial Services  Follow up required by staff             Psychosocial Re-Evaluation:  Psychosocial Re-Evaluation     Row Name 12/28/23 1544 01/24/24 1441           Psychosocial Re-Evaluation   Current issues with Current Stress Concerns Current Stress Concerns      Comments [name] feels less stressed about exercising since starting PR class. She denies any new psychosocial barriers or concerns at this time. Psychosocial monthly re-evaluation is as follows: [name] enjoys coming to class and exercising with supervision. She denies any new psychosocial barriers or concerns at this time.  Expected Outcomes For Srinidhi to partcipate in PR with less stress For [name] to partcipate in PR with less stress      Interventions Encouraged to attend Pulmonary Rehabilitation for the exercise Encouraged to attend Pulmonary Rehabilitation for the exercise      Continue Psychosocial Services  Follow up required by staff No Follow up required               Psychosocial Discharge (Final Psychosocial Re-Evaluation):  Psychosocial Re-Evaluation - 01/24/24 1441       Psychosocial Re-Evaluation   Current issues with Current Stress Concerns    Comments Psychosocial monthly re-evaluation is as follows: [name] enjoys coming to class and exercising with supervision. She denies any new psychosocial barriers or concerns at this time.    Expected Outcomes For [name] to partcipate in PR with less stress    Interventions Encouraged to attend Pulmonary Rehabilitation for the exercise    Continue Psychosocial Services  No Follow up required             Education: Education Goals: Education  classes will be provided on a weekly basis, covering required topics. Participant will state understanding/return demonstration of topics presented.  Learning Barriers/Preferences:  Learning Barriers/Preferences - 12/15/23 1319       Learning Barriers/Preferences   Learning Barriers None    Learning Preferences Written Material;Skilled Demonstration             Education Topics: Know Your Numbers Group instruction that is supported by a PowerPoint presentation. Instructor discusses importance of knowing and understanding resting, exercise, and post-exercise oxygen saturation, heart rate, and blood pressure. Oxygen saturation, heart rate, blood pressure, rating of perceived exertion, and dyspnea are reviewed along with a normal range for these values.  Flowsheet Row PULMONARY REHAB OTHER RESPIRATORY from 01/18/2024 in [facility] for Heart, Vascular, & Lung Health  Date 01/18/24  Educator EP  Instruction Review Code 1- Verbalizes Understanding       Exercise for the Pulmonary Patient Group instruction that is supported by a PowerPoint presentation. Instructor discusses benefits of exercise, core components of exercise, frequency, duration, and intensity of an exercise routine, importance of utilizing pulse oximetry during exercise, safety while exercising, and options of places to exercise outside of rehab.    MET Level  Group instruction provided by PowerPoint, verbal discussion, and written material to support subject matter. Instructor reviews what METs are and how to increase METs.    Pulmonary Medications Verbally interactive group education provided by instructor with focus on inhaled medications and proper administration. Flowsheet Row PULMONARY REHAB OTHER RESPIRATORY from 01/04/2024 in [facility] for Heart, Vascular, & Lung Health  Date 01/04/24  Educator RT  Instruction Review Code 1- Verbalizes Understanding        Anatomy and Physiology of the Respiratory System Group instruction provided by PowerPoint, verbal discussion, and written material to support subject matter. Instructor reviews respiratory cycle and anatomical components of the respiratory system and their functions. Instructor also reviews differences in obstructive and restrictive respiratory diseases with examples of each.  Flowsheet Row PULMONARY REHAB OTHER RESPIRATORY from 12/21/2023 in [facility] for Heart, Vascular, & Lung Health  Date 12/21/23  Educator RT  Instruction Review Code 1- Verbalizes Understanding       Oxygen Safety Group instruction provided by PowerPoint, verbal discussion, and written material to support subject matter. There is an overview of "What is Oxygen" and "Why do we need it".  Instructor also reviews how to create a safe environment for oxygen use, the importance of using oxygen as prescribed, and the risks of noncompliance. There is a brief discussion on traveling with oxygen and resources the patient may utilize. Flowsheet Row PULMONARY REHAB OTHER RESPIRATORY from 01/25/2024 in [facility] for Heart, Vascular, & Lung Health  Date 01/25/24  Educator EP  Instruction Review Code 1- Verbalizes Understanding       Oxygen Use Group instruction provided by PowerPoint, verbal discussion, and written material to discuss how supplemental oxygen is prescribed and different types of oxygen supply systems. Resources for more information are provided.    Breathing Techniques Group instruction that is supported by demonstration and informational handouts. Instructor discusses the benefits of pursed lip and diaphragmatic breathing and detailed demonstration on how to perform both.     Risk Factor Reduction Group instruction that is supported by a PowerPoint presentation. Instructor discusses the definition of a risk factor, different risk factors for pulmonary  disease, and how the heart and lungs work together.   Pulmonary Diseases Group instruction provided by PowerPoint, verbal discussion, and written material to support subject matter. Instructor gives an overview of the different type of pulmonary diseases. There is also a discussion on risk factors and symptoms as well as ways to manage the diseases.   Stress and Energy Conservation Group instruction provided by PowerPoint, verbal discussion, and written material to support subject matter. Instructor gives an overview of stress and the impact it can have on the body. Instructor also reviews ways to reduce stress. There is also a discussion on energy conservation and ways to conserve energy throughout the day.   Warning Signs and Symptoms Group instruction provided by PowerPoint, verbal discussion, and written material to support subject matter. Instructor reviews warning signs and symptoms of stroke, heart attack, cold and flu. Instructor also reviews ways to prevent the spread of infection.   Other Education Group or individual verbal, written, or video instructions that support the educational goals of the pulmonary rehab program. Flowsheet Row PULMONARY REHAB OTHER RESPIRATORY from 12/28/2023 in [facility] for Heart, Vascular, & Lung Health  Date 12/28/23  Educator EP  Instruction Review Code 1- Verbalizes Understanding        Knowledge Questionnaire Score:  Knowledge Questionnaire Score - 12/15/23 1317       Knowledge Questionnaire Score   Pre Score 3/18             Core Components/Risk Factors/Patient Goals at Admission:  Personal Goals and Risk Factors at Admission - 12/15/23 1320       Core Components/Risk Factors/Patient Goals on Admission    Weight Management Weight Loss    Improve shortness of breath with ADL's Yes    Intervention Provide education, individualized exercise plan and daily activity instruction to help decrease symptoms of  SOB with activities of daily living.    Expected Outcomes Short Term: Improve cardiorespiratory fitness to achieve a reduction of symptoms when performing ADLs;Long Term: Be able to perform more ADLs without symptoms or delay the onset of symptoms             Core Components/Risk Factors/Patient Goals Review:   Goals and Risk Factor Review     Row Name 12/28/23 1546 01/24/24 1442           Core Components/Risk Factors/Patient Goals Review   Personal Goals Review Weight Management/Obesity;Improve shortness of breath with ADL's;Develop more efficient breathing techniques such as  purse lipped breathing and diaphragmatic breathing and practicing self-pacing with activity. Weight Management/Obesity;Improve shortness of breath with ADL's;Develop more efficient breathing techniques such as purse lipped breathing and diaphragmatic breathing and practicing self-pacing with activity.      Review [name] has only attended 3 sessions so far. Goal progressing for improving shortness of breath with ADL's. [name] is currently able to maintain sats >88% on RA while exercising. She is currently exercising on the elliptical and the treadmill. Goal progressing for developing more efficient breathing techniques such as purse lipped breathing and diaphragmatic breathing; and practicing self-pacing with activity. We will continue to monitor [name]'s progress throughout the program. Monthly review of patient's Core Components/Risk Factors/Patient Goals are as follows: Goal progressing for improving shortness of breath with ADL's. [name] is currently able to maintain sats >88% on RA while exercising. She is currently exercising on the elliptical for 30 minutes and her dyspnea score remains in parameters. Goal met for developing more efficient breathing techniques such as purse lipped breathing and diaphragmatic breathing; and practicing self-pacing with activity. [name] can initiate PLB independently, works on strengthening  her diaphragm at home practicing diaphragmatic breathing and can self-pace when she feels dyspneic. We will continue to monitor [name]'s progress throughout the program.      Expected Outcomes To improve shortness of breath with ADL's and develop more efficient breathing techniques such as purse lipped breathing and diaphragmatic breathing; and practicing self-pacing with activity. To lose weight, improve shortness of breath with ADL's and develop more efficient breathing techniques such as purse lipped breathing and diaphragmatic breathing; and practicing self-pacing with activity.               Core Components/Risk Factors/Patient Goals at Discharge (Final Review):   Goals and Risk Factor Review - 01/24/24 1442       Core Components/Risk Factors/Patient Goals Review   Personal Goals Review Weight Management/Obesity;Improve shortness of breath with ADL's;Develop more efficient breathing techniques such as purse lipped breathing and diaphragmatic breathing and practicing self-pacing with activity.    Review Monthly review of patient's Core Components/Risk Factors/Patient Goals are as follows: Goal progressing for improving shortness of breath with ADL's. [name] is currently able to maintain sats >88% on RA while exercising. She is currently exercising on the elliptical for 30 minutes and her dyspnea score remains in parameters. Goal met for developing more efficient breathing techniques such as purse lipped breathing and diaphragmatic breathing; and practicing self-pacing with activity. [name] can initiate PLB independently, works on strengthening her diaphragm at home practicing diaphragmatic breathing and can self-pace when she feels dyspneic. We will continue to monitor Jesalyn's progress throughout the program.    Expected Outcomes To lose weight, improve shortness of breath with ADL's and develop more efficient breathing techniques such as purse lipped breathing and diaphragmatic breathing; and  practicing self-pacing with activity.             ITP Comments:Pt is making expected progress toward Pulmonary Rehab goals after completing 12 session(s). Recommend continued exercise, life style modification, education, and utilization of breathing techniques to increase stamina and strength, while also decreasing shortness of breath with exertion.  Dr. Genetta Kenning is Medical Director for Pulmonary Rehab at [facility].

## 2024-02-01 ENCOUNTER — Encounter (HOSPITAL_COMMUNITY)
Admission: RE | Admit: 2024-02-01 | Discharge: 2024-02-01 | Disposition: A | Discharge status: Home / Self Care | Payer: Medicare Other | Source: Ambulatory Visit | Attending: Internal Medicine | Admitting: Internal Medicine

## 2024-02-01 DIAGNOSIS — R0609 Other forms of dyspnea: Secondary | ICD-10-CM | POA: Diagnosis not present

## 2024-02-01 NOTE — Progress Notes (Signed)
 Daily Session Note  Patient Details  Name: [name] MRN: [id number] Date of Birth: 22-Jun-1951 Referring Provider:   Gattis [name] Pulmonary Rehab Walk Test from 12/15/2023 in [facility] for Heart, Vascular, & Lung Health  Referring Provider [name]       Encounter Date: 02/01/2024  Check In:  Session Check In - 02/01/24 1327       Check-In   Supervising physician immediately available to respond to emergencies [facility] MD immediately available    Physician(s) [name] Connor, NP    Location [facility]    Staff Present [name] BS, ACSM-CEP, Exercise Physiologist;[name] Nolon Baxter, MS, ACSM-CEP, Exercise Physiologist;[name] Mizer Carmen Chol, RN, BSN;[name] Porter, MS, Exercise Physiologist    Virtual Visit No    Medication changes reported     No    Fall or balance concerns reported    No    Tobacco Cessation No Change    Warm-up and Cool-down Performed as group-led instruction    Resistance Training Performed Yes    VAD Patient? No    PAD/SET Patient? No      Pain Assessment   Currently in Pain? No/denies    Multiple Pain Sites No             Capillary Blood Glucose: No results found for this or any previous visit (from the past 24 hours).    Social History   Tobacco Use  Smoking Status Never  Smokeless Tobacco Never    Goals Met:  Proper associated with RPD/PD & O2 Sat Independence with exercise equipment Exercise tolerated well No report of concerns or symptoms today Strength training completed today  Goals Unmet:  Not Applicable  Comments: Service time is from 1314 to 1438.    Dr. Genetta [name] is Medical Director for Pulmonary Rehab at [name].

## 2024-02-06 ENCOUNTER — Encounter (HOSPITAL_COMMUNITY)
Admission: RE | Admit: 2024-02-06 | Discharge: 2024-02-06 | Disposition: A | Discharge status: Home / Self Care | Payer: Medicare Other | Source: Ambulatory Visit | Attending: Internal Medicine | Admitting: Internal Medicine

## 2024-02-06 DIAGNOSIS — R0609 Other forms of dyspnea: Secondary | ICD-10-CM

## 2024-02-06 NOTE — Progress Notes (Signed)
 Daily Session Note  Patient Details  Name: LOURA PITT MRN: [id number] Date of Birth: 02-11-1951 Referring Provider:   Gattis Kass Pulmonary Rehab Walk Test from 12/15/2023 in [facility] for Heart, Vascular, & Lung Health  Referring Provider [name]       Encounter Date: 02/06/2024  Check In:  Session Check In - 02/06/24 1436       Check-In   Supervising physician immediately available to respond to emergencies [facility] MD immediately available    Physician(s) Marlana Silvan, NP    [facility]    Staff Present Sueellen Emery BS, ACSM-CEP, Exercise Physiologist;Kaylee Nolon Baxter, MS, ACSM-CEP, Exercise Physiologist;Leverett Camplin Ena Harries, MS, Exercise Physiologist    Virtual Visit No    Medication changes reported     No    Fall or balance concerns reported    No    Tobacco Cessation No Change    Warm-up and Cool-down Performed as group-led instruction    Resistance Training Performed Yes    VAD Patient? No    PAD/SET Patient? No      Pain Assessment   Currently in Pain? No/denies    Multiple Pain Sites No             Capillary Blood Glucose: No results found for this or any previous visit (from the past 24 hours).    Social History   Tobacco Use  Smoking Status Never  Smokeless Tobacco Never    Goals Met:  Proper associated with RPD/PD & O2 Sat Independence with exercise equipment Exercise tolerated well No report of concerns or symptoms today Strength training completed today  Goals Unmet:  Not Applicable  Comments: Service time is from 1316 to 1450.     Dr. Genetta Kenning is Medical Director for Pulmonary Rehab at [facility].

## 2024-02-08 ENCOUNTER — Encounter (HOSPITAL_COMMUNITY)
Admission: RE | Admit: 2024-02-08 | Discharge: 2024-02-08 | Disposition: A | Discharge status: Home / Self Care | Payer: Medicare Other | Source: Ambulatory Visit | Attending: Internal Medicine

## 2024-02-08 DIAGNOSIS — R0609 Other forms of dyspnea: Secondary | ICD-10-CM

## 2024-02-08 DIAGNOSIS — E559 Vitamin D deficiency, unspecified: Secondary | ICD-10-CM | POA: Diagnosis not present

## 2024-02-08 DIAGNOSIS — E782 Mixed hyperlipidemia: Secondary | ICD-10-CM | POA: Diagnosis not present

## 2024-02-08 DIAGNOSIS — E039 Hypothyroidism, unspecified: Secondary | ICD-10-CM | POA: Diagnosis not present

## 2024-02-08 NOTE — Progress Notes (Signed)
 Daily Session Note  Patient Details  Name: [name] MRN: [id number] Date of Birth: 1951/05/18 Referring Provider:   Gattis Kass Pulmonary Rehab Walk Test from 12/15/2023 in [facility] for Heart, Vascular, & Lung Health  Referring Provider [name]       Encounter Date: 02/08/2024  Check In:  Session Check In - 02/08/24 1343       Check-In   Supervising physician immediately available to respond to emergencies [facility] MD immediately available    Physician(s) Lawana Pray, NP    [facility]    Staff Present Sueellen Emery BS, ACSM-CEP, Exercise Physiologist;Kaylee Nolon Baxter, MS, ACSM-CEP, Exercise Physiologist;Casey Carmen Chol, RN, BSN    Virtual Visit No    Medication changes reported     No    Fall or balance concerns reported    No    Tobacco Cessation No Change    Warm-up and Cool-down Performed as group-led instruction    Resistance Training Performed Yes    VAD Patient? No    PAD/SET Patient? No      Pain Assessment   Currently in Pain? No/denies    Multiple Pain Sites No             Capillary Blood Glucose: No results found for this or any previous visit (from the past 24 hours).    Social History   Tobacco Use  Smoking Status Never  Smokeless Tobacco Never    Goals Met:  Independence with exercise equipment Exercise tolerated well No report of concerns or symptoms today Strength training completed today  Goals Unmet:  Not Applicable  Comments: Service time is from 1311 to 1446    Dr. Genetta Kenning is Medical Director for Pulmonary Rehab at [facility].

## 2024-02-12 ENCOUNTER — Ambulatory Visit: Attending: Student | Admitting: Student

## 2024-02-12 VITALS — BP 122/82 | HR 79 | Ht 64.0 in | Wt 161.4 lb

## 2024-02-12 DIAGNOSIS — Z131 Encounter for screening for diabetes mellitus: Secondary | ICD-10-CM | POA: Insufficient documentation

## 2024-02-12 DIAGNOSIS — G4700 Insomnia, unspecified: Secondary | ICD-10-CM | POA: Insufficient documentation

## 2024-02-12 DIAGNOSIS — I428 Other cardiomyopathies: Secondary | ICD-10-CM | POA: Diagnosis not present

## 2024-02-12 DIAGNOSIS — R6889 Other general symptoms and signs: Secondary | ICD-10-CM | POA: Insufficient documentation

## 2024-02-12 DIAGNOSIS — I4719 Other supraventricular tachycardia: Secondary | ICD-10-CM | POA: Insufficient documentation

## 2024-02-12 DIAGNOSIS — I4729 Other ventricular tachycardia: Secondary | ICD-10-CM | POA: Insufficient documentation

## 2024-02-12 DIAGNOSIS — I5042 Chronic combined systolic (congestive) and diastolic (congestive) heart failure: Secondary | ICD-10-CM | POA: Diagnosis not present

## 2024-02-12 DIAGNOSIS — I340 Nonrheumatic mitral (valve) insufficiency: Secondary | ICD-10-CM | POA: Insufficient documentation

## 2024-02-12 DIAGNOSIS — I471 Supraventricular tachycardia, unspecified: Secondary | ICD-10-CM | POA: Insufficient documentation

## 2024-02-12 DIAGNOSIS — R5382 Chronic fatigue, unspecified: Secondary | ICD-10-CM | POA: Diagnosis not present

## 2024-02-12 DIAGNOSIS — E785 Hyperlipidemia, unspecified: Secondary | ICD-10-CM | POA: Diagnosis not present

## 2024-02-12 NOTE — Patient Instructions (Signed)
 Medication Instructions:  TAKE YOUR NEXLIZET  EVERY-OTHER-DAY TAKE YOUR METOPROLOL  DAILY *If you need a refill on your cardiac medications before your next appointment, please call your pharmacy*  Lab Work: FASTING LIPID, LFT AND A1C IN 3 MONTHS If you have labs (blood work) drawn today and your tests are completely normal, you will receive your results only by:  MyChart Message (if you have MyChart) OR A paper copy in the mail If you have any lab test that is abnormal or we need to change your treatment, we will call you to review the results.  Testing/Procedures: NONE  Follow-Up: At [facility], you and your health needs are our priority.  As part of our continuing mission to provide you with exceptional heart care, our providers are all part of one team.  This team includes your primary Cardiologist (physician) and Advanced Practice Providers or APPs (Physician Assistants and Nurse Practitioners) who all work together to provide you with the care you need, when you need it.  Your next appointment:   6 month(s)  Provider:   Arun K Thukkani, MD or Callie Goodrich, PA-C

## 2024-02-13 ENCOUNTER — Encounter (HOSPITAL_COMMUNITY)
Admission: RE | Admit: 2024-02-13 | Discharge: 2024-02-13 | Disposition: A | Discharge status: Home / Self Care | Payer: Medicare Other | Source: Ambulatory Visit | Attending: Internal Medicine

## 2024-02-13 VITALS — Wt 161.8 lb

## 2024-02-13 DIAGNOSIS — R0609 Other forms of dyspnea: Secondary | ICD-10-CM | POA: Diagnosis not present

## 2024-02-13 NOTE — Progress Notes (Signed)
 Daily Session Note  Patient Details  Name: [name] MRN: [id number] Date of Birth: 30-Aug-1951 Referring Provider:   Gattis Kass Pulmonary Rehab Walk Test from 12/15/2023 in [facility] for Heart, Vascular, & Lung Health  Referring Provider [name]       Encounter Date: 02/13/2024  Check In:  Session Check In - 02/13/24 1428       Check-In   Supervising physician immediately available to respond to emergencies [facility] MD immediately available    Physician(s) Lawana Pray, NP    [facility]    Staff Present Sueellen Emery BS, ACSM-CEP, Exercise Physiologist;Kaylee Nolon Baxter, MS, ACSM-CEP, Exercise Physiologist;Casey Felipe Horton, Graig Lawyer, RN, BSN;Johnny Alexia Angelucci, MS, Exercise Physiologist;Maria Whitaker, RN, BSN    Virtual Visit No    Medication changes reported     No    Fall or balance concerns reported    No    Tobacco Cessation No Change    Warm-up and Cool-down Performed as group-led instruction    Resistance Training Performed Yes    VAD Patient? No    PAD/SET Patient? No      Pain Assessment   Currently in Pain? No/denies    Multiple Pain Sites No             Capillary Blood Glucose: No results found for this or any previous visit (from the past 24 hours).   Exercise Prescription Changes - 02/13/24 1500       Response to Exercise   Blood Pressure (Admit) 104/66    Blood Pressure (Exercise) 130/70    Blood Pressure (Exit) 112/62    Heart Rate (Admit) 74 bpm    Heart Rate (Exercise) 105 bpm    Heart Rate (Exit) 85 bpm    Oxygen Saturation (Admit) 99 %    Oxygen Saturation (Exercise) 95 %    Oxygen Saturation (Exit) 96 %    Rating of Perceived Exertion (Exercise) 11    Perceived Dyspnea (Exercise) 1    Duration Continue with 30 min of aerobic exercise without signs/symptoms of physical distress.    Intensity THRR unchanged      Resistance Training   Training Prescription Yes    Weight black bands    Reps  10-15    Time 10 Minutes      Interval Training   Interval Training Yes    Equipment Elliptical    Comments --   speed 3, incline 1, METs 6.1, 4 min AND speed 8, incline 3, METs 7.5, 4 min     Elliptical   Level 8    Speed 3    Minutes 15    METs --   intervals 6.1-7.5            Social History   Tobacco Use  Smoking Status Never  Smokeless Tobacco Never    Goals Met:  Independence with exercise equipment Exercise tolerated well No report of concerns or symptoms today Strength training completed today  Goals Unmet:  Not Applicable  Comments: Service time is from 1312 to 1454    Dr. Genetta Kenning is Medical Director for Pulmonary Rehab at [facility]

## 2024-02-15 ENCOUNTER — Encounter (HOSPITAL_COMMUNITY)
Admission: RE | Admit: 2024-02-15 | Discharge: 2024-02-15 | Disposition: A | Discharge status: Home / Self Care | Payer: Medicare Other | Source: Ambulatory Visit | Attending: Internal Medicine | Admitting: Internal Medicine

## 2024-02-15 DIAGNOSIS — R0609 Other forms of dyspnea: Secondary | ICD-10-CM | POA: Diagnosis not present

## 2024-02-15 DIAGNOSIS — I10 Essential (primary) hypertension: Secondary | ICD-10-CM | POA: Diagnosis not present

## 2024-02-15 DIAGNOSIS — E039 Hypothyroidism, unspecified: Secondary | ICD-10-CM | POA: Diagnosis not present

## 2024-02-15 DIAGNOSIS — M810 Age-related osteoporosis without current pathological fracture: Secondary | ICD-10-CM | POA: Diagnosis not present

## 2024-02-15 NOTE — Progress Notes (Signed)
 Home Exercise Prescription I have reviewed a Home Exercise Prescription with Jenica J Cai. She is currently walking her dog and doing resistance training. Discussed joining [employer] to have access to elliptical and continue to challenge herself. Also discussed keeping HR up for walking her dog. Discussed 30 min, 5 days a week. Pt is motivated. The patient stated that their goals were to remain active and healthy as well as increase sociability. We reviewed exercise guidelines, target heart rate during exercise, RPE Scale, weather conditions, endpoints for exercise, warmup and cool down. The patient is encouraged to come to me with any questions. I will continue to follow up with the patient to assist them with progression and safety.  Spent 15 min discussing home exercise plan and goals.  Chelan Heringer [place], [place], ACSM-CEP 02/15/2024 3:39 PM

## 2024-02-20 ENCOUNTER — Encounter (HOSPITAL_COMMUNITY)
Admission: RE | Admit: 2024-02-20 | Discharge: 2024-02-20 | Disposition: A | Discharge status: Home / Self Care | Payer: Medicare Other | Source: Ambulatory Visit | Attending: Internal Medicine | Admitting: Internal Medicine

## 2024-02-20 DIAGNOSIS — R0609 Other forms of dyspnea: Secondary | ICD-10-CM | POA: Diagnosis not present

## 2024-02-20 NOTE — Progress Notes (Signed)
 Daily Session Note  Patient Details  Name: [name] MRN: [id number] Date of Birth: [birth year] Referring Provider:   Gattis Kass Pulmonary Rehab Walk Test from 12/15/2023 in [facility]  Referring Provider [name]       Encounter Date: 02/20/2024  Check In:  Session Check In - 02/20/24 1326       Check-In   Supervising physician immediately available to respond to emergencies [facility] MD immediately available    Physician(s) Theresia Flasher, NP    Location [facility]    Staff Present Sueellen Emery BS, ACSM-CEP, Exercise Physiologist;Chelcie Estorga Nolon Baxter, MS, ACSM-CEP, Exercise Physiologist;Casey Carmen Chol, RN, BSN;Samantha Belarus, RD, LDN;Johnny Alexia Angelucci, MS, Exercise Physiologist    Virtual Visit No    Medication changes reported     No    Fall or balance concerns reported    No    Tobacco Cessation No Change    Warm-up and Cool-down Performed as group-led instruction    Resistance Training Performed Yes    VAD Patient? No    PAD/SET Patient? No      Pain Assessment   Currently in Pain? No/denies    Multiple Pain Sites No             Capillary Blood Glucose: No results found for this or any previous visit (from the past 24 hours).    Social History   Tobacco Use  Smoking Status Never  Smokeless Tobacco Never    Goals Met:  Proper associated with RPD/PD & O2 Sat Exercise tolerated well No report of concerns or symptoms today Strength training completed today  Goals Unmet:  Not Applicable  Comments: Service time is from 1310 to 1445.    Dr. Genetta Kenning is Medical Director for Pulmonary Rehab at [facility].

## 2024-02-22 ENCOUNTER — Encounter (HOSPITAL_COMMUNITY)
Admission: RE | Admit: 2024-02-22 | Discharge: 2024-02-22 | Disposition: A | Discharge status: Home / Self Care | Payer: Medicare Other | Source: Ambulatory Visit | Attending: Internal Medicine | Admitting: Internal Medicine

## 2024-02-22 DIAGNOSIS — R0609 Other forms of dyspnea: Secondary | ICD-10-CM | POA: Diagnosis not present

## 2024-02-22 NOTE — Progress Notes (Signed)
 Daily Session Note  Patient Details  Name: VERJEAN NEUGEBAUER MRN: [id number] Date of Birth: 1951/02/16 Referring Provider:   Gattis Kass Pulmonary Rehab Walk Test from 12/15/2023 in [facility], V[facility], & [facility]  Referring Provider [name]       Encounter Date: 02/22/2024  Check In:  Session Check In - 02/22/24 1336       Check-In   Supervising physician immediately available to respond to emergencies [facility] MD immediately available    Physician(s) Levin Reamer, NP    [facility]    Staff Present Sueellen Emery BS, ACSM-CEP, Exercise Physiologist;Providence Stivers Nolon Baxter, MS, ACSM-CEP, Exercise Physiologist;Casey Carmen Chol, RN, BSN    Virtual Visit No    Medication changes reported     No    Fall or balance concerns reported    No    Tobacco Cessation No Change    Warm-up and Cool-down Performed as group-led instruction    Resistance Training Performed Yes    VAD Patient? No    PAD/SET Patient? No      Pain Assessment   Currently in Pain? No/denies    Multiple Pain Sites No             Capillary Blood Glucose: No results found for this or any previous visit (from the past 24 hours).    Social History   Tobacco Use  Smoking Status Never  Smokeless Tobacco Never    Goals Met:  Proper associated with RPD/PD & O2 Sat Independence with exercise equipment Exercise tolerated well No report of concerns or symptoms today Strength training completed today  Goals Unmet:  Not Applicable  Comments: Service time is from 1323 to 1448.    Dr. Genetta Kenning is Medical Director for Pulmonary Rehab at [facility].

## 2024-02-27 ENCOUNTER — Encounter (HOSPITAL_COMMUNITY)
Admission: RE | Admit: 2024-02-27 | Discharge: 2024-02-27 | Disposition: A | Discharge status: Home / Self Care | Payer: Medicare Other | Source: Ambulatory Visit | Attending: Internal Medicine

## 2024-02-27 DIAGNOSIS — R0609 Other forms of dyspnea: Secondary | ICD-10-CM

## 2024-02-27 NOTE — Progress Notes (Signed)
 Daily Session Note  Patient Details  Name: [name] MRN: [id number] Date of Birth: 26-May-1951 Referring Provider:   Gattis Kass Pulmonary Rehab Walk Test from 12/15/2023 in [facility] for [facility], Vascular, & Lung Health  Referring Provider [name]       Encounter Date: 02/27/2024  Check In:  Session Check In - 02/27/24 1406       Check-In   Supervising physician immediately available to respond to emergencies [facility] MD immediately available    Physician(s) Levin Reamer, NP    [facility]    Staff Present Sueellen Emery BS, ACSM-CEP, Exercise Physiologist;Christopher Hink Nolon Baxter, MS, ACSM-CEP, Exercise Physiologist;Casey Carmen Chol, RN, BSN    Virtual Visit No    Medication changes reported     No    Fall or balance concerns reported    No    Tobacco Cessation No Change    Warm-up and Cool-down Performed as group-led instruction    Resistance Training Performed Yes    VAD Patient? No    PAD/SET Patient? No      Pain Assessment   Currently in Pain? No/denies    Multiple Pain Sites No             Capillary Blood Glucose: No results found for this or any previous visit (from the past 24 hours).   Exercise Prescription Changes - 02/27/24 1400       Response to Exercise   Blood Pressure (Admit) 122/80    Blood Pressure (Exercise) 164/76    Blood Pressure (Exit) 116/72    Heart Rate (Admit) 70 bpm    Heart Rate (Exercise) 115 bpm    Heart Rate (Exit) 81 bpm    Oxygen Saturation (Admit) 97 %    Oxygen Saturation (Exercise) 95 %    Oxygen Saturation (Exit) 96 %    Rating of Perceived Exertion (Exercise) 10    Perceived Dyspnea (Exercise) 1    Duration Continue with 30 min of aerobic exercise without signs/symptoms of physical distress.    Intensity THRR unchanged      Progression   Progression Continue to progress workloads to maintain intensity without signs/symptoms of physical distress.       Resistance Training   Training Prescription Yes    Weight black bands    Reps 10-15    Time 10 Minutes      Interval Training   Interval Training Yes    Equipment Elliptical      Elliptical   Level 8    Speed 2    Minutes 15    METs 7             Social History   Tobacco Use  Smoking Status Never  Smokeless Tobacco Never    Goals Met:  Proper associated with RPD/PD & O2 Sat Exercise tolerated well No report of concerns or symptoms today Strength training completed today  Goals Unmet:  Not Applicable  Comments: Service time is from 1314 to 1437.    Dr. Genetta Kenning is Medical Director for Pulmonary Rehab at [facility].

## 2024-02-28 NOTE — Progress Notes (Signed)
 Pulmonary Individual Treatment Plan  Patient Details  Name: [name] MRN: [id number] Date of Birth: Apr 19, 1951 Referring Provider:   Gattis Kass Pulmonary Rehab Walk Test from 12/15/2023 in [facility]  Referring Provider Thukkani       Initial Encounter Date:  Flowsheet Row Pulmonary Rehab Walk Test from 12/15/2023 in [facility]  Date 12/15/23       Visit Diagnosis: DOE (dyspnea on exertion)  Patient's Home Medications on Admission:   Current Outpatient Medications:    Bempedoic Acid-Ezetimibe (NEXLIZET ) 180-10 MG TABS, Take 1 tablet by mouth at bedtime. (Patient taking differently: Take 1 tablet by mouth every other day.), Disp: 90 tablet, Rfl: 3   Cholecalciferol (VITAMIN D3) 50 MCG (2000 UT) capsule, Take 2,000 Units by mouth daily., Disp: , Rfl:    ciclopirox (LOPROX) 0.77 % cream, Apply 1 application. topically 2 (two) times daily as needed (foot fungus)., Disp: , Rfl:    clotrimazole (LOTRIMIN) 1 % cream, as needed., Disp: , Rfl:    metoprolol  succinate (TOPROL -XL) 25 MG 24 hr tablet, Take 1 tablet (25 mg total) by mouth daily. Pt takes 1/2 tablet once a day., Disp: 90 tablet, Rfl: 3   Multiple Vitamins-Minerals (MULTIVITAMINS THER. W/MINERALS) TABS tablet, Take 1 tablet by mouth daily., Disp: , Rfl:    TIROSINT  37.5 MCG CAPS, Take 1 capsule by mouth every morning., Disp: , Rfl:   Past Medical History: Past Medical History:  Diagnosis Date   Adenomatous polyp    Allergic rhinitis    Anxiety    AV Nodal Reentry Tachycardia    s/p RFCA GT 2/15   AVNRT (AV nodal re-entry tachycardia) (HCC) 11/06/2013   Calcified granuloma of lung    Cataract    CHF (congestive heart failure) (HCC)    Diverticulosis 01/2020   Gastritis    GERD (gastroesophageal reflux disease)    H/O radiofrequency ablation for complex left atrial arrhythmia 12/12/2013    Headache    Hematuria    Hemorrhoids    HTN (hypertension)    Hyperlipemia    Hypothyroidism    Insomnia    Iron deficiency    Irregular heart beat    Laryngopharyngeal reflux (LPR)    Migraine    Mitral valve prolapse    ?   Rectal leakage    1 time   Retinal tear of both eyes    Skin lesion of breast    Vitamin D deficiency    Vulvodynia     Tobacco Use: Social History   Tobacco Use  Smoking Status Never  Smokeless Tobacco Never    Labs: Review Flowsheet       Latest Ref Rng & Units 12/29/2010 01/18/2012 04/07/2022 01/19/2024  Labs for ITP Cardiac and Pulmonary Rehab  Cholestrol 100 - 199 mg/dL - 782  - 956   LDL (calc) 0 - 99 mg/dL - - - 71   Direct LDL mg/dL - 213.0  - -  HDL-C >86 mg/dL - 57.84  - 50   Trlycerides 0 - 149 mg/dL - 696.2  - 952   Bicarbonate 20.0 - 28.0 mmol/L - - 24.6  25.2  25.4  25.7  24.0  25.6  -  TCO2 22 - 32 mmol/L 24  - 26  26  26  27  25  27   -  O2 Saturation % - - 63  63  66  58  70  57  -    Details       Multiple values from one day are sorted in reverse-chronological order         Capillary Blood Glucose: No results found for: "GLUCAP"   Pulmonary Assessment Scores:  Pulmonary Assessment Scores     Row Name 12/15/23 1307         ADL UCSD   ADL Phase Entry     SOB Score total 42       CAT Score   CAT Score 20       mMRC Score   mMRC Score 3             UCSD: Self-administered rating of dyspnea associated with activities of daily living (ADLs) 6-point scale (0 = "not at all" to 5 = "maximal or unable to do because of breathlessness")  Scoring Scores range from 0 to 120.  Minimally important difference is 5 units  CAT: CAT can identify the health impairment of COPD patients and is better correlated with disease progression.  CAT has a scoring range of zero to 40. The CAT score is classified into four groups of low (less than 10), medium (10 - 20), high (21-30) and very high (31-40) based on the impact level  of disease on health status. A CAT score over 10 suggests significant symptoms.  A worsening CAT score could be explained by an exacerbation, poor medication adherence, poor inhaler technique, or progression of COPD or comorbid conditions.  CAT MCID is 2 points  mMRC: mMRC (Modified Medical Research Council) Dyspnea Scale is used to assess the degree of baseline functional disability in patients of respiratory disease due to dyspnea. No minimal important difference is established. A decrease in score of 1 point or greater is considered a positive change.   Pulmonary Function Assessment:  Pulmonary Function Assessment - 12/15/23 1431       Breath   Bilateral Breath Sounds Clear    Shortness of Breath Fear of Shortness of Breath             Exercise Target Goals: Exercise Program Goal: Individual exercise prescription set using results from initial 6 min walk test and THRR while considering  patient's activity barriers and safety.   Exercise Prescription Goal: Initial exercise prescription builds to 30-45 minutes a day of aerobic activity, 2-3 days per week.  Home exercise guidelines will be given to patient during program as part of exercise prescription that the participant will acknowledge.  Activity Barriers & Risk Stratification:  Activity Barriers & Cardiac Risk Stratification - 12/15/23 1322       Activity Barriers & Cardiac Risk Stratification   Activity Barriers Deconditioning;Muscular Weakness;Shortness of Breath    Cardiac Risk Stratification Moderate             6 Minute Walk:  6 Minute Walk     Row Name 12/15/23 1418         6 Minute Walk   Phase Initial     Distance 1680 feet     Walk Time 6 minutes     # of Rest Breaks 0     MPH 3.18     METS 3.46     RPE 13     Perceived Dyspnea  1     VO2 Peak 12.11     Symptoms No     Resting HR 79 bpm     Resting BP 122/80     Resting Oxygen  Saturation  98 %     Exercise Oxygen Saturation  during 6 min  walk 96 %     Max Ex. HR 122 bpm     Max Ex. BP 164/82     2 Minute Post BP 132/80       Interval HR   1 Minute HR 104     2 Minute HR 114     3 Minute HR 117     4 Minute HR 118     5 Minute HR 121     6 Minute HR 122     2 Minute Post HR 87     Interval Heart Rate? Yes       Interval Oxygen   Interval Oxygen? Yes     Baseline Oxygen Saturation % 98 %     1 Minute Oxygen Saturation % 97 %     1 Minute Liters of Oxygen 0 L     2 Minute Oxygen Saturation % 97 %     2 Minute Liters of Oxygen 0 L     3 Minute Oxygen Saturation % 97 %     3 Minute Liters of Oxygen 0 L     4 Minute Oxygen Saturation % 98 %     4 Minute Liters of Oxygen 0 L     5 Minute Oxygen Saturation % 96 %     5 Minute Liters of Oxygen 0 L     6 Minute Oxygen Saturation % 96 %     6 Minute Liters of Oxygen 0 L     2 Minute Post Oxygen Saturation % 99 %     2 Minute Post Liters of Oxygen 0 L              Oxygen Initial Assessment:  Oxygen Initial Assessment - 12/15/23 1321       Home Oxygen   Home Oxygen Device None    Sleep Oxygen Prescription None    Home Exercise Oxygen Prescription None    Home Resting Oxygen Prescription None      Initial 6 min Walk   Oxygen Used None      Program Oxygen Prescription   Program Oxygen Prescription None      Intervention   Short Term Goals To learn and understand importance of maintaining oxygen saturations>88%;To learn and understand importance of monitoring SPO2 with pulse oximeter and demonstrate accurate use of the pulse oximeter.;To learn and demonstrate proper pursed lip breathing techniques or other breathing techniques.     Long  Term Goals Verbalizes importance of monitoring SPO2 with pulse oximeter and return demonstration;Maintenance of O2 saturations>88%;Exhibits proper breathing techniques, such as pursed lip breathing or other method taught during program session             Oxygen Re-Evaluation:  Oxygen Re-Evaluation     Row Name  12/29/23 0837 01/26/24 0716 02/21/24 1251         Program Oxygen Prescription   Program Oxygen Prescription None None None       Home Oxygen   Home Oxygen Device None None None     Sleep Oxygen Prescription None None None     Home Exercise Oxygen Prescription None None None     Home Resting Oxygen Prescription None None None       Goals/Expected Outcomes   Short Term Goals To learn and understand importance of maintaining oxygen saturations>88%;To learn and understand importance of monitoring SPO2 with pulse oximeter and  demonstrate accurate use of the pulse oximeter.;To learn and demonstrate proper pursed lip breathing techniques or other breathing techniques.  To learn and understand importance of maintaining oxygen saturations>88%;To learn and understand importance of monitoring SPO2 with pulse oximeter and demonstrate accurate use of the pulse oximeter.;To learn and demonstrate proper pursed lip breathing techniques or other breathing techniques.  To learn and understand importance of maintaining oxygen saturations>88%;To learn and understand importance of monitoring SPO2 with pulse oximeter and demonstrate accurate use of the pulse oximeter.;To learn and demonstrate proper pursed lip breathing techniques or other breathing techniques.      Long  Term Goals Verbalizes importance of monitoring SPO2 with pulse oximeter and return demonstration;Maintenance of O2 saturations>88%;Exhibits proper breathing techniques, such as pursed lip breathing or other method taught during program session Verbalizes importance of monitoring SPO2 with pulse oximeter and return demonstration;Maintenance of O2 saturations>88%;Exhibits proper breathing techniques, such as pursed lip breathing or other method taught during program session Verbalizes importance of monitoring SPO2 with pulse oximeter and return demonstration;Maintenance of O2 saturations>88%;Exhibits proper breathing techniques, such as pursed lip breathing  or other method taught during program session     Goals/Expected Outcomes Compliance and understanding of oxygen saturation monitoring and breath techniques to decrease shortness of breath. Compliance and understanding of oxygen saturation monitoring and breath techniques to decrease shortness of breath. Compliance and understanding of oxygen saturation monitoring and breath techniques to decrease shortness of breath.              Oxygen Discharge (Final Oxygen Re-Evaluation):  Oxygen Re-Evaluation - 02/21/24 1251       Program Oxygen Prescription   Program Oxygen Prescription None      Home Oxygen   Home Oxygen Device None    Sleep Oxygen Prescription None    Home Exercise Oxygen Prescription None    Home Resting Oxygen Prescription None      Goals/Expected Outcomes   Short Term Goals To learn and understand importance of maintaining oxygen saturations>88%;To learn and understand importance of monitoring SPO2 with pulse oximeter and demonstrate accurate use of the pulse oximeter.;To learn and demonstrate proper pursed lip breathing techniques or other breathing techniques.     Long  Term Goals Verbalizes importance of monitoring SPO2 with pulse oximeter and return demonstration;Maintenance of O2 saturations>88%;Exhibits proper breathing techniques, such as pursed lip breathing or other method taught during program session    Goals/Expected Outcomes Compliance and understanding of oxygen saturation monitoring and breath techniques to decrease shortness of breath.             Initial Exercise Prescription:  Initial Exercise Prescription - 12/15/23 1400       Date of Initial Exercise RX and Referring Provider   Date 12/15/23    Referring Provider Thukkani    Expected Discharge Date 12/15/23      Treadmill   MPH 2.8    Grade 0    Minutes 15    METs 3.14      Elliptical   Level 1    Speed 1    Minutes 15    METs 3.14      Prescription Details   Frequency (times per  week) 2    Duration Progress to 30 minutes of continuous aerobic without signs/symptoms of physical distress      Intensity   THRR 40-80% of Max Heartrate 60-119    Ratings of Perceived Exertion 11-13    Perceived Dyspnea 0-4      Progression  Progression Continue to progress workloads to maintain intensity without signs/symptoms of physical distress.      Resistance Training   Training Prescription Yes    Weight blue bands    Reps 10-15             Perform Capillary Blood Glucose checks as needed.  Exercise Prescription Changes:   Exercise Prescription Changes     Row Name 01/02/24 1500 01/16/24 1500 01/30/24 1500 02/13/24 1500 02/15/24 1500     Response to Exercise   Blood Pressure (Admit) 128/80 108/64 118/62 104/66 --   Blood Pressure (Exercise) 140/78 130/60 134/74 130/70 --   Blood Pressure (Exit) 120/70 110/60 110/70 112/62 --   Heart Rate (Admit) 80 bpm 74 bpm 76 bpm 74 bpm --   Heart Rate (Exercise) 115 bpm 122 bpm 111 bpm 105 bpm --   Heart Rate (Exit) 96 bpm 87 bpm 86 bpm 85 bpm --   Oxygen Saturation (Admit) 99 % 98 % 98 % 99 % --   Oxygen Saturation (Exercise) 95 % 93 % 97 % 95 % --   Oxygen Saturation (Exit) 97 % 97 % 98 % 96 % --   Rating of Perceived Exertion (Exercise) 11 11 10 11  --   Perceived Dyspnea (Exercise) 1 1 1 1  --   Duration Continue with 30 min of aerobic exercise without signs/symptoms of physical distress. Continue with 30 min of aerobic exercise without signs/symptoms of physical distress. Continue with 30 min of aerobic exercise without signs/symptoms of physical distress. Continue with 30 min of aerobic exercise without signs/symptoms of physical distress. --   Intensity THRR unchanged THRR unchanged THRR unchanged THRR unchanged --     Resistance Training   Training Prescription Yes Yes Yes Yes --   Weight black bands black bands black bands black bands --   Reps 10-15 10-15 10-15 10-15 --   Time 10 Minutes 10 Minutes 10 Minutes 10  Minutes --     Interval Training   Interval Training -- -- -- Yes --   Equipment -- -- -- Elliptical --   Comments -- -- -- --  speed 3, incline 1, METs 6.1, 4 min AND speed 8, incline 3, METs 7.5, 4 min --     Treadmill   MPH -- 3 -- -- --   Grade -- 3.4 -- -- --   Minutes -- 15 -- -- --   METs -- 4.9 -- -- --     Elliptical   Level 3 3 5 8  --   Speed 2 2 2 3  --   Minutes 15 15 15 15  --   METs 5.2 6 7  --  intervals 6.1-7.5 --     Home Exercise Plan   Plans to continue exercise at -- -- -- -- [employer] (comment)   Frequency -- -- -- -- Add 1 additional day to program exercise sessions.   Initial Home Exercises Provided -- -- -- -- 02/15/24    Row Name 02/27/24 1400             Response to Exercise   Blood Pressure (Admit) 122/80       Blood Pressure (Exercise) 164/76       Blood Pressure (Exit) 116/72       Heart Rate (Admit) 70 bpm       Heart Rate (Exercise) 115 bpm       Heart Rate (Exit) 81 bpm       Oxygen Saturation (Admit) 97 %  Oxygen Saturation (Exercise) 95 %       Oxygen Saturation (Exit) 96 %       Rating of Perceived Exertion (Exercise) 10       Perceived Dyspnea (Exercise) 1       Duration Continue with 30 min of aerobic exercise without signs/symptoms of physical distress.       Intensity THRR unchanged         Progression   Progression Continue to progress workloads to maintain intensity without signs/symptoms of physical distress.         Resistance Training   Training Prescription Yes       Weight black bands       Reps 10-15       Time 10 Minutes         Interval Training   Interval Training Yes       Equipment Elliptical         Elliptical   Level 8       Speed 2       Minutes 15       METs 7                Exercise Comments:   Exercise Comments     Row Name 12/21/23 1521 02/15/24 1537         Exercise Comments Pt completed first day of exericse. Conny exercised for 15 min on the upright elliptical and  treadmill. Maitland averaged 4.6 METs at level and speed 1 on the upright elliptical and 4.3 METs at 3.5 mph and 3.2% incline on the treadmill. Valda performed the warmup and cooldown standing without limitations. Discussed METs. Pt voiced understanding. Discussed with pt home exercise plan. She is currently walking her dog and doing resistance training. Discussed joining [employer] to have access to elliptical and continue to challenge herself. Also discussed keeping HR up for walking her dog. Discussed 30 min, 5 days a week. Pt is motivated.               Exercise Goals and Review:   Exercise Goals     Row Name 12/15/23 1322             Exercise Goals   Increase Physical Activity Yes       Intervention Provide advice, education, support and counseling about physical activity/exercise needs.;Develop an individualized exercise prescription for aerobic and resistive training based on initial evaluation findings, risk stratification, comorbidities and participant's personal goals.       Expected Outcomes Short Term: Attend rehab on a regular basis to increase amount of physical activity.;Long Term: Add in home exercise to make exercise part of routine and to increase amount of physical activity.;Long Term: Exercising regularly at least 3-5 days a week.       Increase Strength and Stamina Yes       Intervention Provide advice, education, support and counseling about physical activity/exercise needs.;Develop an individualized exercise prescription for aerobic and resistive training based on initial evaluation findings, risk stratification, comorbidities and participant's personal goals.       Expected Outcomes Short Term: Increase workloads from initial exercise prescription for resistance, speed, and METs.;Short Term: Perform resistance training exercises routinely during rehab and add in resistance training at home;Long Term: Improve cardiorespiratory fitness, muscular endurance and strength as  measured by increased METs and functional capacity ( )       Able to understand and use rate of perceived exertion (RPE) scale Yes  Intervention Provide education and explanation on how to use RPE scale       Expected Outcomes Short Term: Able to use RPE daily in rehab to express subjective intensity level;Long Term:  Able to use RPE to guide intensity level when exercising independently       Able to understand and use Dyspnea scale Yes       Intervention Provide education and explanation on how to use Dyspnea scale       Expected Outcomes Short Term: Able to use Dyspnea scale daily in rehab to express subjective sense of shortness of breath during exertion;Long Term: Able to use Dyspnea scale to guide intensity level when exercising independently       Knowledge and understanding of Target Heart Rate Range (THRR) Yes       Intervention Provide education and explanation of THRR including how the numbers were predicted and where they are located for reference       Expected Outcomes Short Term: Able to state/look up THRR;Long Term: Able to use THRR to govern intensity when exercising independently;Short Term: Able to use daily as guideline for intensity in rehab       Understanding of Exercise Prescription Yes       Intervention Provide education, explanation, and written materials on patient's individual exercise prescription       Expected Outcomes Short Term: Able to explain program exercise prescription;Long Term: Able to explain home exercise prescription to exercise independently                Exercise Goals Re-Evaluation :  Exercise Goals Re-Evaluation     Row Name 12/29/23 0837 01/26/24 0714 02/21/24 1252         Exercise Goal Re-Evaluation   Exercise Goals Review Increase Physical Activity;Able to understand and use Dyspnea scale;Understanding of Exercise Prescription;Increase Strength and Stamina;Knowledge and understanding of Target Heart Rate Range (THRR);Able to  understand and use rate of perceived exertion (RPE) scale Increase Physical Activity;Able to understand and use Dyspnea scale;Understanding of Exercise Prescription;Increase Strength and Stamina;Knowledge and understanding of Target Heart Rate Range (THRR);Able to understand and use rate of perceived exertion (RPE) scale Increase Physical Activity;Able to understand and use Dyspnea scale;Understanding of Exercise Prescription;Increase Strength and Stamina;Knowledge and understanding of Target Heart Rate Range (THRR);Able to understand and use rate of perceived exertion (RPE) scale     Comments Pt has completed 3 exercise sessions. She is motivated and tolerating well. She is exercising on the upright elliptical for 15 min, level 2, incline 1, METs 5.6. She then is walking on the treadmill for 15 min, 3.4 mph, 3% incline, METs 4.6. She completes warm up and cool down without limitations, currently using blue bands (5.8 lbs) but doubles the band for some exercises for 11 lbs. Will progress as tolerated. Pt has completed 11 exercise sessions. She is motivated and tolerating well. She is exercising on the upright elliptical for 30 min, level 3, incline 1, METs 6.1.  She perfers the elliptical for longer instead of doing two 15 min stations as she feels it is more challenging.  She completes warm up and cool down without limitations, currently using black bands (7.3 lbs). Will progress as tolerated. Pt has completed 18 exercise sessions. She is motivated and tolerating well. She is exercising on the upright elliptical for 30 min, with intervals. Her max speed is 4 and incline is 8, max METs is 7.2. She decreases level when her HR increases to 125.   She completes  warm up and cool down without limitations, currently using black bands (7.3 lbs). Will progress as tolerated. She plans to continue exercise at the gym when she graduates.     Expected Outcomes Through exercise at rehab and home, the patient will decrease  shortness of breath with daily activities and feel confident in carrying out an exercise regimen at home. Through exercise at rehab and home, the patient will decrease shortness of breath with daily activities and feel confident in carrying out an exercise regimen at home. Through exercise at rehab and home, the patient will decrease shortness of breath with daily activities and feel confident in carrying out an exercise regimen at home.              Discharge Exercise Prescription (Final Exercise Prescription Changes):  Exercise Prescription Changes - 02/27/24 1400       Response to Exercise   Blood Pressure (Admit) 122/80    Blood Pressure (Exercise) 164/76    Blood Pressure (Exit) 116/72    Heart Rate (Admit) 70 bpm    Heart Rate (Exercise) 115 bpm    Heart Rate (Exit) 81 bpm    Oxygen Saturation (Admit) 97 %    Oxygen Saturation (Exercise) 95 %    Oxygen Saturation (Exit) 96 %    Rating of Perceived Exertion (Exercise) 10    Perceived Dyspnea (Exercise) 1    Duration Continue with 30 min of aerobic exercise without signs/symptoms of physical distress.    Intensity THRR unchanged      Progression   Progression Continue to progress workloads to maintain intensity without signs/symptoms of physical distress.      Resistance Training   Training Prescription Yes    Weight black bands    Reps 10-15    Time 10 Minutes      Interval Training   Interval Training Yes    Equipment Elliptical      Elliptical   Level 8    Speed 2    Minutes 15    METs 7             Nutrition:  Target Goals: Understanding of nutrition guidelines, daily intake of sodium 1500mg , cholesterol 200mg , calories 30% from fat and 7% or less from saturated fats, daily to have 5 or more servings of fruits and vegetables.  Biometrics:  Pre Biometrics - 12/15/23 1430       Pre Biometrics   Grip Strength 22 kg              Nutrition Therapy Plan and Nutrition Goals:  Nutrition Therapy  & Goals - 02/27/24 1123       Nutrition Therapy   Diet Heart Healthy Diet      Personal Nutrition Goals   Nutrition Goal Patient to improve diet quality by using the plate method as a guide for meal planning to include lean protein/plant protein, fruits, vegetables, whole grains, nonfat dairy as part of a well-balanced diet.   goal in progress   Personal Goal #2 Patient to identify strategies for weight loss of 0.5-2.0# per week.   goal in action.   Personal Goal #3 Patient to limit sodium intake to 2300mg  per day   goal in progress   Comments Goals in progress. Niyonna has medical history of hyperlipidemia, CHF, DOE, hypothyroidism. Kyelee reports concern of weight gain; we discussed weight monitoring and reading food label for sodium. We further discussed strategies for weight loss including calorie density, decreasing calorie dense foods, protein supplements,  etc.  She does report drinking>32oz of 2% milk/chocolate milk daily; she is pre-contemplative toward changing this aspect of her diet. She reports that she has decreased eating ice cream over the last three weeks. She is down 6.8# since starting with our program.  Lipids are at goal. Patient will benefit from participation in pulmonary rehab for nutrition, exercise, and lifestyle modification.      Intervention Plan   Intervention Prescribe, educate and counsel regarding individualized specific dietary modifications aiming towards targeted core components such as weight, hypertension, lipid management, diabetes, heart failure and other comorbidities.;Nutrition handout(s) given to patient.    Expected Outcomes Short Term Goal: Understand basic principles of dietary content, such as calories, fat, sodium, cholesterol and nutrients.;Long Term Goal: Adherence to prescribed nutrition plan.             Nutrition Assessments:  Nutrition Assessments - 12/21/23 1621       Rate Your Plate Scores   Pre Score 53            MEDIFICTS Score  Key: >=70 Need to make dietary changes  40-70 Heart Healthy Diet <= 40 Therapeutic Level Cholesterol Diet  Flowsheet Row PULMONARY REHAB OTHER RESPIRATORY from 12/21/2023 in [facility]  Picture Your Plate Total Score on Admission 53      Picture Your Plate Scores: <95 Unhealthy dietary pattern with much room for improvement. 41-50 Dietary pattern unlikely to meet recommendations for good health and room for improvement. 51-60 More healthful dietary pattern, with some room for improvement.  >60 Healthy dietary pattern, although there may be some specific behaviors that could be improved.    Nutrition Goals Re-Evaluation:  Nutrition Goals Re-Evaluation     Row Name 12/26/23 1512 01/29/24 0857 02/27/24 1123         Goals   Current Weight 165 lb 12.6 oz (75.2 kg) 161 lb 6 oz (73.2 kg) 160 lb 7.9 oz (72.8 kg)     Comment cholesterol 213, LDL 227 (allergy to pravastatin, rosuvastatin ) lipids WNL(nexlizet ), LDL 71 no new labs; most recent labs ipids WNL(nexlizet ), LDL 71     Expected Outcome Munni has medical history of hyperlipidemia, CHF, DOE, hypothyroidism. Ommie reports concern of weight gain; we discussed weight monitoring and reading food label for sodium. We further discussed strategies for weight loss including calorie density, decreasing calorie dense foods, protein supplements, etc. She is up ~12.5# over the last year (69.5kg at 02/06/23 visit). She does report drinking>32oz of 2% milk/chocolate milk daily. She reports that she has decreased eating ice cream over the last three weeks. Patient will benefit from participation in pulmonary rehab for nutrition, exercise, and lifestyle modification. Damari has medical history of hyperlipidemia, CHF, DOE, hypothyroidism. An reports concern of weight gain; we discussed weight monitoring and reading food label for sodium. We further discussed strategies for weight loss including  calorie density, decreasing calorie dense foods, protein supplements, etc. She is up ~8# over the last year (69.5kg at 02/06/23 visit). She does report drinking>32oz of 2% milk/chocolate milk daily. She reports that she has decreased eating ice cream over the last three weeks. She is down 4.2# since starting with our program. Lipids are at goal. Patient will benefit from participation in pulmonary rehab for nutrition, exercise, and lifestyle modification. Goals in progress. Dalaina has medical history of hyperlipidemia, CHF, DOE, hypothyroidism. Souad reports concern of weight gain; we discussed weight monitoring and reading food label for sodium. We further discussed  strategies for weight loss including calorie density, decreasing calorie dense foods, protein supplements, etc. She does report drinking>32oz of 2% milk/chocolate milk daily; she is pre-contemplative toward changing this aspect of her diet. She reports that she has decreased eating ice cream over the last three weeks. She is down 6.8# since starting with our program. Lipids are at goal. Patient will benefit from participation in pulmonary rehab for nutrition, exercise, and lifestyle modification.              Nutrition Goals Discharge (Final Nutrition Goals Re-Evaluation):  Nutrition Goals Re-Evaluation - 02/27/24 1123       Goals   Current Weight 160 lb 7.9 oz (72.8 kg)    Comment no new labs; most recent labs ipids WNL(nexlizet ), LDL 71    Expected Outcome Goals in progress. Yvonnia has medical history of hyperlipidemia, CHF, DOE, hypothyroidism. Shruti reports concern of weight gain; we discussed weight monitoring and reading food label for sodium. We further discussed strategies for weight loss including calorie density, decreasing calorie dense foods, protein supplements, etc. She does report drinking>32oz of 2% milk/chocolate milk daily; she is pre-contemplative toward changing this aspect of her diet. She reports that she has decreased  eating ice cream over the last three weeks. She is down 6.8# since starting with our program. Lipids are at goal. Patient will benefit from participation in pulmonary rehab for nutrition, exercise, and lifestyle modification.             Psychosocial: Target Goals: Acknowledge presence or absence of significant depression and/or stress, maximize coping skills, provide positive support system. Participant is able to verbalize types and ability to use techniques and skills needed for reducing stress and depression.  Initial Review & Psychosocial Screening:  Initial Psych Review & Screening - 12/15/23 1317       Initial Review   Current issues with Current Stress Concerns    Source of Stress Concerns Chronic Illness    Comments fear of cardiac event with exercise      Family Dynamics   Good Support System? Yes    Comments friends and son      Barriers   Psychosocial barriers to participate in program The patient should benefit from training in stress management and relaxation.      Screening Interventions   Interventions Encouraged to exercise    Expected Outcomes Short Term goal: Utilizing psychosocial counselor, staff and physician to assist with identification of specific Stressors or current issues interfering with healing process. Setting desired goal for each stressor or current issue identified.;Long Term Goal: Stressors or current issues are controlled or eliminated.;Short Term goal: Identification and review with participant of any Quality of Life or Depression concerns found by scoring the questionnaire.;Long Term goal: The participant improves quality of Life and PHQ9 Scores as seen by post scores and/or verbalization of changes             Quality of Life Scores:  Scores of 19 and below usually indicate a poorer quality of life in these areas.  A difference of  2-3 points is a clinically meaningful difference.  A difference of 2-3 points in the total score of the  Quality of Life Index has been associated with significant improvement in overall quality of life, self-image, physical symptoms, and general health in studies assessing change in quality of life.  PHQ-9: Review Flowsheet       12/15/2023 12/08/2016  Depression screen PHQ 2/9  Decreased Interest 0 0  Down, Depressed,  Hopeless 1 0  PHQ - 2 Score 1 0  Altered sleeping 3 -  Tired, decreased energy 3 -  Change in appetite 0 -  Feeling bad or failure about yourself  0 -  Trouble concentrating 0 -  Moving slowly or fidgety/restless 0 -  Suicidal thoughts 0 -  PHQ-9 Score 7 -  Difficult doing work/chores Not difficult at all -   Interpretation of Total Score  Total Score Depression Severity:  1-4 = Minimal depression, 5-9 = Mild depression, 10-14 = Moderate depression, 15-19 = Moderately severe depression, 20-27 = Severe depression   Psychosocial Evaluation and Intervention:  Psychosocial Evaluation - 12/15/23 1319       Psychosocial Evaluation & Interventions   Interventions Stress management education;Relaxation education;Encouraged to exercise with the program and follow exercise prescription    Expected Outcomes Pt to exercise to help relieve fear    Continue Psychosocial Services  Follow up required by staff             Psychosocial Re-Evaluation:  Psychosocial Re-Evaluation     Row Name 12/28/23 1544 01/24/24 1441 02/22/24 0938         Psychosocial Re-Evaluation   Current issues with Current Stress Concerns Current Stress Concerns None Identified     Comments Iyla feels less stressed about exercising since starting PR class. She denies any new psychosocial barriers or concerns at this time. Psychosocial monthly re-evaluation is as follows: Jenie enjoys coming to class and exercising with supervision. She denies any new psychosocial barriers or concerns at this time. Psychosocial monthly re-evaluation is as follows: Marsia enjoys coming to class and exercising with  supervision. She denies any new psychosocial barriers or concerns at this time.     Expected Outcomes For Chakira to partcipate in PR with less stress For Ninna to partcipate in PR with less stress For Algie to continue to partcipate in PR free of psy/soc barriers or concerns     Interventions Encouraged to attend Pulmonary Rehabilitation for the exercise Encouraged to attend Pulmonary Rehabilitation for the exercise Encouraged to attend Pulmonary Rehabilitation for the exercise     Continue Psychosocial Services  Follow up required by staff No Follow up required No Follow up required              Psychosocial Discharge (Final Psychosocial Re-Evaluation):  Psychosocial Re-Evaluation - 02/22/24 1610       Psychosocial Re-Evaluation   Current issues with None Identified    Comments Psychosocial monthly re-evaluation is as follows: Terree enjoys coming to class and exercising with supervision. She denies any new psychosocial barriers or concerns at this time.    Expected Outcomes For Lavoris to continue to partcipate in PR free of psy/soc barriers or concerns    Interventions Encouraged to attend Pulmonary Rehabilitation for the exercise    Continue Psychosocial Services  No Follow up required             Education: Education Goals: Education classes will be provided on a weekly basis, covering required topics. Participant will state understanding/return demonstration of topics presented.  Learning Barriers/Preferences:  Learning Barriers/Preferences - 12/15/23 1319       Learning Barriers/Preferences   Learning Barriers None    Learning Preferences Written Material;Skilled Demonstration             Education Topics: Know Your Numbers Group instruction that is supported by a PowerPoint presentation. Instructor discusses importance of knowing and understanding resting, exercise, and post-exercise oxygen saturation, heart rate, and  blood pressure. Oxygen saturation, heart rate,  blood pressure, rating of perceived exertion, and dyspnea are reviewed along with a normal range for these values.  Flowsheet Row PULMONARY REHAB OTHER RESPIRATORY from 01/18/2024 in [facility] for [facility], Vascular, & Lung Health  Date 01/18/24  Educator EP  Instruction Review Code 1- Verbalizes Understanding       Exercise for the Pulmonary Patient Group instruction that is supported by a PowerPoint presentation. Instructor discusses benefits of exercise, core components of exercise, frequency, duration, and intensity of an exercise routine, importance of utilizing pulse oximetry during exercise, safety while exercising, and options of places to exercise outside of rehab.    MET Level  Group instruction provided by PowerPoint, verbal discussion, and written material to support subject matter. Instructor reviews what METs are and how to increase METs.    Pulmonary Medications Verbally interactive group education provided by instructor with focus on inhaled medications and proper administration. Flowsheet Row PULMONARY REHAB OTHER RESPIRATORY from 01/04/2024 in [facility], Vascular, & Lung [facility]  Date 01/04/24  Educator RT  Instruction Review Code 1- Verbalizes Understanding       Anatomy and Physiology of the Respiratory System Group instruction provided by PowerPoint, verbal discussion, and written material to support subject matter. Instructor reviews respiratory cycle and anatomical components of the respiratory system and their functions. Instructor also reviews differences in obstructive and restrictive respiratory diseases with examples of each.  Flowsheet Row PULMONARY REHAB OTHER RESPIRATORY from 12/21/2023 in [facility] for Heart, Vascular, & Lung Health  Date 12/21/23  Educator RT  Instruction Review Code 1- Verbalizes Understanding       Oxygen Safety Group instruction provided by  PowerPoint, verbal discussion, and written material to support subject matter. There is an overview of "What is Oxygen" and "Why do we need it".  Instructor also reviews how to create a safe environment for oxygen use, the importance of using oxygen as prescribed, and the risks of noncompliance. There is a brief discussion on traveling with oxygen and resources the patient may utilize. Flowsheet Row PULMONARY REHAB OTHER RESPIRATORY from 01/25/2024 in [facility] for Heart, Vascular, & Lung Health  Date 01/25/24  Educator EP  Instruction Review Code 1- Verbalizes Understanding       Oxygen Use Group instruction provided by PowerPoint, verbal discussion, and written material to discuss how supplemental oxygen is prescribed and different types of oxygen supply systems. Resources for more information are provided.  Flowsheet Row PULMONARY REHAB OTHER RESPIRATORY from 02/01/2024 in [facility] for Heart, Vascular, & Lung Health  Date 02/01/24  Educator RT  Instruction Review Code 1- Verbalizes Understanding       Breathing Techniques Group instruction that is supported by demonstration and informational handouts. Instructor discusses the benefits of pursed lip and diaphragmatic breathing and detailed demonstration on how to perform both.  Flowsheet Row PULMONARY REHAB OTHER RESPIRATORY from 02/08/2024 in [facility] for Heart, Vascular, & Lung Health  Date 02/08/24  Educator RN  Instruction Review Code 1- Verbalizes Understanding        Risk Factor Reduction Group instruction that is supported by a PowerPoint presentation. Instructor discusses the definition of a risk factor, different risk factors for pulmonary disease, and how the heart and lungs work together.   Pulmonary Diseases Group instruction provided by PowerPoint, verbal discussion, and written material to support subject matter. Instructor gives  an overview  of the different type of pulmonary diseases. There is also a discussion on risk factors and symptoms as well as ways to manage the diseases.   Stress and Energy Conservation Group instruction provided by PowerPoint, verbal discussion, and written material to support subject matter. Instructor gives an overview of stress and the impact it can have on the body. Instructor also reviews ways to reduce stress. There is also a discussion on energy conservation and ways to conserve energy throughout the day. Flowsheet Row PULMONARY REHAB OTHER RESPIRATORY from 02/15/2024 in [facility] for Heart, Vascular, & Lung Health  Date 02/15/24  Educator RN  Instruction Review Code 1- Verbalizes Understanding       Warning Signs and Symptoms Group instruction provided by PowerPoint, verbal discussion, and written material to support subject matter. Instructor reviews warning signs and symptoms of stroke, heart attack, cold and flu. Instructor also reviews ways to prevent the spread of infection. Flowsheet Row PULMONARY REHAB OTHER RESPIRATORY from 02/22/2024 in [facility] for Heart, Vascular, & Lung Health  Date 02/22/24  Educator RN  Instruction Review Code 1- Verbalizes Understanding       Other Education Group or individual verbal, written, or video instructions that support the educational goals of the pulmonary rehab program. Flowsheet Row PULMONARY REHAB OTHER RESPIRATORY from 12/28/2023 in [facility] for Heart, Vascular, & Lung Health  Date 12/28/23  Educator EP  Instruction Review Code 1- Verbalizes Understanding        Knowledge Questionnaire Score:  Knowledge Questionnaire Score - 12/15/23 1317       Knowledge Questionnaire Score   Pre Score 3/18             Core Components/Risk Factors/Patient Goals at Admission:  Personal Goals and Risk Factors at Admission - 12/15/23 1320       Core Components/Risk  Factors/Patient Goals on Admission    Weight Management Weight Loss    Improve shortness of breath with ADL's Yes    Intervention Provide education, individualized exercise plan and daily activity instruction to help decrease symptoms of SOB with activities of daily living.    Expected Outcomes Short Term: Improve cardiorespiratory fitness to achieve a reduction of symptoms when performing ADLs;Long Term: Be able to perform more ADLs without symptoms or delay the onset of symptoms             Core Components/Risk Factors/Patient Goals Review:   Goals and Risk Factor Review     Row Name 12/28/23 1546 01/24/24 1442 02/22/24 0940         Core Components/Risk Factors/Patient Goals Review   Personal Goals Review Weight Management/Obesity;Improve shortness of breath with ADL's;Develop more efficient breathing techniques such as purse lipped breathing and diaphragmatic breathing and practicing self-pacing with activity. Weight Management/Obesity;Improve shortness of breath with ADL's;Develop more efficient breathing techniques such as purse lipped breathing and diaphragmatic breathing and practicing self-pacing with activity. Weight Management/Obesity;Improve shortness of breath with ADL's     Review Shaunte has only attended 3 sessions so far. Goal progressing for improving shortness of breath with ADL's. Deserea is currently able to maintain sats >88% on RA while exercising. She is currently exercising on the elliptical and the treadmill. Goal progressing for developing more efficient breathing techniques such as purse lipped breathing and diaphragmatic breathing; and practicing self-pacing with activity. We will continue to monitor Xavier's progress throughout the program. Monthly review of patient's Core Components/Risk Factors/Patient Goals are as  follows: Goal progressing for improving shortness of breath with ADL's. Haileyann is currently able to maintain sats >88% on RA while exercising. She is  currently exercising on the elliptical for 30 minutes and her dyspnea score remains in parameters. Goal met for developing more efficient breathing techniques such as purse lipped breathing and diaphragmatic breathing; and practicing self-pacing with activity. Camdyn can initiate PLB independently, works on strengthening her diaphragm at home practicing diaphragmatic breathing and can self-pace when she feels dyspneic. We will continue to monitor Aily's progress throughout the program. Monthly review of patient's Core Components/Risk Factors/Patient Goals are as follows: Goal progressing for improving shortness of breath with ADL's. Dorethia is currently able to maintain sats >88% on RA while exercising. She is currently exercising on the elliptical for 30 minutes and her dyspnea score remains in parameters. Goal progressing for weight loss. Jaydence is down 4.4# since starting the program. We will continue to monitor Kasandra's progress throughout the program.     Expected Outcomes To improve shortness of breath with ADL's and develop more efficient breathing techniques such as purse lipped breathing and diaphragmatic breathing; and practicing self-pacing with activity. To lose weight, improve shortness of breath with ADL's and develop more efficient breathing techniques such as purse lipped breathing and diaphragmatic breathing; and practicing self-pacing with activity. To lose weight and improve shortness of breath with ADL's              Core Components/Risk Factors/Patient Goals at Discharge (Final Review):   Goals and Risk Factor Review - 02/22/24 0940       Core Components/Risk Factors/Patient Goals Review   Personal Goals Review Weight Management/Obesity;Improve shortness of breath with ADL's    Review Monthly review of patient's Core Components/Risk Factors/Patient Goals are as follows: Goal progressing for improving shortness of breath with ADL's. Ivona is currently able to maintain sats >88% on RA  while exercising. She is currently exercising on the elliptical for 30 minutes and her dyspnea score remains in parameters. Goal progressing for weight loss. Dearia is down 4.4# since starting the program. We will continue to monitor Lovetta's progress throughout the program.    Expected Outcomes To lose weight and improve shortness of breath with ADL's             ITP Comments: Pt is making expected progress toward Pulmonary Rehab goals after completing 20 session(s). Recommend continued exercise, life style modification, education, and utilization of breathing techniques to increase stamina and strength, while also decreasing shortness of breath with exertion.     Comments: Dr. Genetta Kenning is Medical Director for Pulmonary Rehab at [facility].

## 2024-02-29 ENCOUNTER — Encounter (HOSPITAL_COMMUNITY): Admission: RE | Admit: 2024-02-29 | Payer: Medicare Other | Source: Ambulatory Visit

## 2024-03-05 ENCOUNTER — Encounter (HOSPITAL_COMMUNITY)
Admission: RE | Admit: 2024-03-05 | Discharge: 2024-03-05 | Disposition: A | Discharge status: Home / Self Care | Payer: Medicare Other | Source: Ambulatory Visit | Attending: Internal Medicine | Admitting: Internal Medicine

## 2024-03-05 DIAGNOSIS — R0609 Other forms of dyspnea: Secondary | ICD-10-CM | POA: Diagnosis not present

## 2024-03-05 NOTE — Progress Notes (Signed)
 Daily Session Note  Patient Details  Name: [name] MRN: [id number] Date of Birth: 1951-04-18 Referring Provider:   Gattis Kass Pulmonary Rehab Walk Test from 12/15/2023 in [facility] for [facility], Vascular, & Lung [facility]  Referring Provider [name]       Encounter Date: 03/05/2024  Check In:   Capillary Blood Glucose: No results found for this or any previous visit (from the past 24 hours).    Social History   Tobacco Use  Smoking Status Never  Smokeless Tobacco Never    Goals Met:  Proper associated with RPD/PD & O2 Sat Independence with exercise equipment Exercise tolerated well No report of concerns or symptoms today Strength training completed today  Goals Unmet:  Not Applicable  Comments: Service time is from 1321 to 1440.    Dr. Genetta Kenning is Medical Director for Pulmonary Rehab at [facility].

## 2024-03-07 ENCOUNTER — Encounter (HOSPITAL_COMMUNITY)
Admission: RE | Admit: 2024-03-07 | Discharge: 2024-03-07 | Disposition: A | Discharge status: Home / Self Care | Payer: Medicare Other | Source: Ambulatory Visit | Attending: Internal Medicine | Admitting: Internal Medicine

## 2024-03-07 DIAGNOSIS — R0609 Other forms of dyspnea: Secondary | ICD-10-CM

## 2024-03-07 NOTE — Progress Notes (Signed)
 Daily Session Note  Patient Details  Name: [name] MRN: [id number] Date of Birth: 11-10-1950 Referring Provider:   Gattis Kass Pulmonary Rehab Walk Test from 12/15/2023 in [facility] for Heart, Vascular, & Lung Health  Referring Provider [name]       Encounter Date: 03/07/2024  Check In:  Session Check In - 03/07/24 1325       Check-In   Supervising physician immediately available to respond to emergencies [facility] MD immediately available    Physician(s) Charles Connor, NP    Location [facility]    Staff Present Sueellen Emery BS, ACSM-CEP, Exercise Physiologist;Kaylee Nolon Baxter, MS, ACSM-CEP, Exercise Physiologist;Tierre Netto Carmen Chol, RN, BSN    Virtual Visit No    Medication changes reported     No    Fall or balance concerns reported    No    Tobacco Cessation No Change    Warm-up and Cool-down Performed as group-led instruction    Resistance Training Performed Yes    VAD Patient? No    PAD/SET Patient? No      Pain Assessment   Currently in Pain? No/denies    Multiple Pain Sites No             Capillary Blood Glucose: No results found for this or any previous visit (from the past 24 hours).    Social History   Tobacco Use  Smoking Status Never  Smokeless Tobacco Never    Goals Met:  Proper associated with RPD/PD & O2 Sat Independence with exercise equipment Exercise tolerated well No report of concerns or symptoms today Strength training completed today  Goals Unmet:  Not Applicable  Comments: Service time is from 1316 to 1440.    Dr. Genetta Kenning is Medical Director for Pulmonary Rehab at [facility].

## 2024-03-12 ENCOUNTER — Encounter (HOSPITAL_COMMUNITY)
Admission: RE | Admit: 2024-03-12 | Discharge: 2024-03-12 | Disposition: A | Discharge status: Home / Self Care | Payer: Medicare Other | Source: Ambulatory Visit | Attending: Internal Medicine

## 2024-03-12 VITALS — Wt 157.4 lb

## 2024-03-12 DIAGNOSIS — R0609 Other forms of dyspnea: Secondary | ICD-10-CM

## 2024-03-12 NOTE — Progress Notes (Signed)
 Daily Session Note  Patient Details  Name: [name] MRN: [id number] Date of Birth: 04/04/1951 Referring Provider:   Gattis Kass Pulmonary Rehab Walk Test from 12/15/2023 in [facility]  Referring Provider [name]       Encounter Date: 03/12/2024  Check In:  Session Check In - 03/12/24 1334       Check-In   Supervising physician immediately available to respond to emergencies [facility] MD immediately available    Physician(s) Charles Connor, NP    Location [facility]    Staff Present Sueellen Emery BS, ACSM-CEP, Exercise Physiologist;Kaylee Nolon Baxter, MS, ACSM-CEP, Exercise Physiologist;Mary Arlester Ladd, RN, BSN;Johnny Porter, MS, Exercise Physiologist    Virtual Visit No    Medication changes reported     No    Fall or balance concerns reported    No    Tobacco Cessation No Change    Warm-up and Cool-down Performed as group-led instruction    Resistance Training Performed Yes    VAD Patient? No    PAD/SET Patient? No      Pain Assessment   Currently in Pain? No/denies    Multiple Pain Sites No             Capillary Blood Glucose: No results found for this or any previous visit (from the past 24 hours).   Exercise Prescription Changes - 03/12/24 1500       Response to Exercise   Blood Pressure (Admit) 128/62    Blood Pressure (Exercise) 146/80    Blood Pressure (Exit) 102/64    Heart Rate (Admit) 68 bpm    Heart Rate (Exercise) 112 bpm    Heart Rate (Exit) 76 bpm    Oxygen Saturation (Admit) 98 %    Oxygen Saturation (Exercise) 96 %    Oxygen Saturation (Exit) 96 %    Rating of Perceived Exertion (Exercise) 11    Perceived Dyspnea (Exercise) 1    Duration Continue with 30 min of aerobic exercise without signs/symptoms of physical distress.    Intensity THRR unchanged      Progression   Progression Continue to progress workloads to maintain intensity without signs/symptoms of physical  distress.    Average METs 7      Resistance Training   Training Prescription Yes    Weight black bands    Reps 10-15    Time 10 Minutes      Elliptical   Level 4    Speed 1    Minutes 15    METs 6.2             Social History   Tobacco Use  Smoking Status Never  Smokeless Tobacco Never    Goals Met:  Independence with exercise equipment Exercise tolerated well No report of concerns or symptoms today Strength training completed today  Goals Unmet:  Not Applicable  Comments: Pt graduated today. Service time is from 1302 to 1439.    Dr. Genetta Kenning is Medical Director for Pulmonary Rehab at [facility].

## 2024-03-13 NOTE — Progress Notes (Signed)
 Discharge Progress Report  Patient Details  Name: [name] MRN: [id number] Date of Birth: 28-May-1951 Referring Provider:   Gattis Kass Pulmonary Rehab Walk Test from 12/15/2023 in [facility]  Referring Provider Thukkani        Number of Visits: 8  Reason for Discharge:  Patient has met program and personal goals.  Smoking History:  Social History   Tobacco Use  Smoking Status Never  Smokeless Tobacco Never    Diagnosis:  DOE (dyspnea on exertion)  ADL UCSD:  Pulmonary Assessment Scores     Row Name 12/15/23 1307 03/12/24 1544       ADL UCSD   ADL Phase Entry Exit    SOB Score total 42 8      CAT Score   CAT Score 20 16      mMRC Score   mMRC Score 3 1             Initial Exercise Prescription:  Initial Exercise Prescription - 12/15/23 1400       Date of Initial Exercise RX and Referring Provider   Date 12/15/23    Referring Provider Thukkani    Expected Discharge Date 12/15/23      Treadmill   MPH 2.8    Grade 0    Minutes 15    METs 3.14      Elliptical   Level 1    Speed 1    Minutes 15    METs 3.14      Prescription Details   Frequency (times per week) 2    Duration Progress to 30 minutes of continuous aerobic without signs/symptoms of physical distress      Intensity   THRR 40-80% of Max Heartrate 60-119    Ratings of Perceived Exertion 11-13    Perceived Dyspnea 0-4      Progression   Progression Continue to progress workloads to maintain intensity without signs/symptoms of physical distress.      Resistance Training   Training Prescription Yes    Weight blue bands    Reps 10-15             Discharge Exercise Prescription (Final Exercise Prescription Changes):  Exercise Prescription Changes - 03/12/24 1500       Response to Exercise   Blood Pressure (Admit) 128/62    Blood Pressure (Exercise) 146/80    Blood Pressure (Exit) 102/64    Heart  Rate (Admit) 68 bpm    Heart Rate (Exercise) 112 bpm    Heart Rate (Exit) 76 bpm    Oxygen Saturation (Admit) 98 %    Oxygen Saturation (Exercise) 96 %    Oxygen Saturation (Exit) 96 %    Rating of Perceived Exertion (Exercise) 11    Perceived Dyspnea (Exercise) 1    Duration Continue with 30 min of aerobic exercise without signs/symptoms of physical distress.    Intensity THRR unchanged      Progression   Progression Continue to progress workloads to maintain intensity without signs/symptoms of physical distress.    Average METs 7      Resistance Training   Training Prescription Yes    Weight black bands    Reps 10-15    Time 10 Minutes      Elliptical   Level 4    Speed 1    Minutes 15    METs 6.2  Functional Capacity:  6 Minute Walk     Row Name 12/15/23 1418 03/12/24 1528       6 Minute Walk   Phase Initial Discharge    Distance 1680 feet 1740 feet    Distance % Change -- 3.57 %    Distance Feet Change -- 60 ft    Walk Time 6 minutes 6 minutes    # of Rest Breaks 0 0    MPH 3.18 3.3    METS 3.46 3.86    RPE 13 11    Perceived Dyspnea  1 1    VO2 Peak 12.11 13.49    Symptoms No No    Resting HR 79 bpm 68 bpm    Resting BP 122/80 128/62    Resting Oxygen Saturation  98 % 98 %    Exercise Oxygen Saturation  during 6 min walk 96 % 96 %    Max Ex. HR 122 bpm 112 bpm    Max Ex. BP 164/82 146/80    2 Minute Post BP 132/80 110/72      Interval HR   1 Minute HR 104 90    2 Minute HR 114 103    3 Minute HR 117 108    4 Minute HR 118 108    5 Minute HR 121 111    6 Minute HR 122 112    2 Minute Post HR 87 80    Interval Heart Rate? Yes --      Interval Oxygen   Interval Oxygen? Yes --    Baseline Oxygen Saturation % 98 % 98 %    1 Minute Oxygen Saturation % 97 % 98 %    1 Minute Liters of Oxygen 0 L 0 L    2 Minute Oxygen Saturation % 97 % 97 %    2 Minute Liters of Oxygen 0 L 0 L    3 Minute Oxygen Saturation % 97 % 98 %    3 Minute  Liters of Oxygen 0 L 0 L    4 Minute Oxygen Saturation % 98 % 97 %    4 Minute Liters of Oxygen 0 L 0 L    5 Minute Oxygen Saturation % 96 % 97 %    5 Minute Liters of Oxygen 0 L 0 L    6 Minute Oxygen Saturation % 96 % 96 %    6 Minute Liters of Oxygen 0 L 0 L    2 Minute Post Oxygen Saturation % 99 % 99 %    2 Minute Post Liters of Oxygen 0 L 0 L             Psychological, QOL, Others - Outcomes: PHQ 2/9:    03/12/2024    3:43 PM 12/15/2023    1:09 PM 12/08/2016    2:28 PM  Depression screen PHQ 2/9  Decreased Interest 1 0 0  Down, Depressed, Hopeless 1 1 0  PHQ - 2 Score 2 1 0  Altered sleeping 3 3   Tired, decreased energy 2 3   Change in appetite 0 0   Feeling bad or failure about yourself  0 0   Trouble concentrating 0 0   Moving slowly or fidgety/restless 0 0   Suicidal thoughts 0 0   PHQ-9 Score 7 7   Difficult doing work/chores Not difficult at all Not difficult at all     Quality of Life:   Personal Goals: Goals established at orientation with interventions  provided to work toward goal.  Personal Goals and Risk Factors at Admission - 12/15/23 1320       Core Components/Risk Factors/Patient Goals on Admission    Weight Management Weight Loss    Improve shortness of breath with ADL's Yes    Intervention Provide education, individualized exercise plan and daily activity instruction to help decrease symptoms of SOB with activities of daily living.    Expected Outcomes Short Term: Improve cardiorespiratory fitness to achieve a reduction of symptoms when performing ADLs;Long Term: Be able to perform more ADLs without symptoms or delay the onset of symptoms              Personal Goals Discharge:  Goals and Risk Factor Review     Row Name 12/28/23 1546 01/24/24 1442 02/22/24 0940 03/13/24 0936       Core Components/Risk Factors/Patient Goals Review   Personal Goals Review Weight Management/Obesity;Improve shortness of breath with ADL's;Develop more  efficient breathing techniques such as purse lipped breathing and diaphragmatic breathing and practicing self-pacing with activity. Weight Management/Obesity;Improve shortness of breath with ADL's;Develop more efficient breathing techniques such as purse lipped breathing and diaphragmatic breathing and practicing self-pacing with activity. Weight Management/Obesity;Improve shortness of breath with ADL's --    Review [name] has only attended 3 sessions so far. Goal progressing for improving shortness of breath with ADL's. [name] is currently able to maintain sats >88% on RA while exercising. She is currently exercising on the elliptical and the treadmill. Goal progressing for developing more efficient breathing techniques such as purse lipped breathing and diaphragmatic breathing; and practicing self-pacing with activity. We will continue to monitor [name]'s progress throughout the program. Monthly review of patient's Core Components/Risk Factors/Patient Goals are as follows: Goal progressing for improving shortness of breath with ADL's. [name] is currently able to maintain sats >88% on RA while exercising. She is currently exercising on the elliptical for 30 minutes and her dyspnea score remains in parameters. Goal met for developing more efficient breathing techniques such as purse lipped breathing and diaphragmatic breathing; and practicing self-pacing with activity. [name] can initiate PLB independently, works on strengthening her diaphragm at home practicing diaphragmatic breathing and can self-pace when she feels dyspneic. We will continue to monitor [name]'s progress throughout the program. Monthly review of patient's Core Components/Risk Factors/Patient Goals are as follows: Goal progressing for improving shortness of breath with ADL's. [name] is currently able to maintain sats >88% on RA while exercising. She is currently exercising on the elliptical for 30 minutes and her dyspnea score remains in parameters. Goal  progressing for weight loss. [name] is down 4.4# since starting the program. We will continue to monitor [name]'s progress throughout the program. [name] graduated from the PR program on 03/12/24. She met her goal for improving SOB with ADL's. Her initial SOB score 42/post 8. She met her goal for weight loss. She is down 6.8#'s at the time of discharge. [name] did great during the program and we wish her the best.    Expected Outcomes To improve shortness of breath with ADL's and develop more efficient breathing techniques such as purse lipped breathing and diaphragmatic breathing; and practicing self-pacing with activity. To lose weight, improve shortness of breath with ADL's and develop more efficient breathing techniques such as purse lipped breathing and diaphragmatic breathing; and practicing self-pacing with activity. To lose weight and improve shortness of breath with ADL's To continue to exercise and modify her nutrition and lifestyle post graduation  Exercise Goals and Review:  Exercise Goals     Row Name 12/15/23 1322             Exercise Goals   Increase Physical Activity Yes       Intervention Provide advice, education, support and counseling about physical activity/exercise needs.;Develop an individualized exercise prescription for aerobic and resistive training based on initial evaluation findings, risk stratification, comorbidities and participant's personal goals.       Expected Outcomes Short Term: Attend rehab on a regular basis to increase amount of physical activity.;Long Term: Add in home exercise to make exercise part of routine and to increase amount of physical activity.;Long Term: Exercising regularly at least 3-5 days a week.       Increase Strength and Stamina Yes       Intervention Provide advice, education, support and counseling about physical activity/exercise needs.;Develop an individualized exercise prescription for aerobic and resistive training based on  initial evaluation findings, risk stratification, comorbidities and participant's personal goals.       Expected Outcomes Short Term: Increase workloads from initial exercise prescription for resistance, speed, and METs.;Short Term: Perform resistance training exercises routinely during rehab and add in resistance training at home;Long Term: Improve cardiorespiratory fitness, muscular endurance and strength as measured by increased METs and functional capacity ( )       Able to understand and use rate of perceived exertion (RPE) scale Yes       Intervention Provide education and explanation on how to use RPE scale       Expected Outcomes Short Term: Able to use RPE daily in rehab to express subjective intensity level;Long Term:  Able to use RPE to guide intensity level when exercising independently       Able to understand and use Dyspnea scale Yes       Intervention Provide education and explanation on how to use Dyspnea scale       Expected Outcomes Short Term: Able to use Dyspnea scale daily in rehab to express subjective sense of shortness of breath during exertion;Long Term: Able to use Dyspnea scale to guide intensity level when exercising independently       Knowledge and understanding of Target Heart Rate Range (THRR) Yes       Intervention Provide education and explanation of THRR including how the numbers were predicted and where they are located for reference       Expected Outcomes Short Term: Able to state/look up THRR;Long Term: Able to use THRR to govern intensity when exercising independently;Short Term: Able to use daily as guideline for intensity in rehab       Understanding of Exercise Prescription Yes       Intervention Provide education, explanation, and written materials on patient's individual exercise prescription       Expected Outcomes Short Term: Able to explain program exercise prescription;Long Term: Able to explain home exercise prescription to exercise independently                 Exercise Goals Re-Evaluation:  Exercise Goals Re-Evaluation     Row Name 12/29/23 0837 01/26/24 0714 02/21/24 1252 03/12/24 1531       Exercise Goal Re-Evaluation   Exercise Goals Review Increase Physical Activity;Able to understand and use Dyspnea scale;Understanding of Exercise Prescription;Increase Strength and Stamina;Knowledge and understanding of Target Heart Rate Range (THRR);Able to understand and use rate of perceived exertion (RPE) scale Increase Physical Activity;Able to understand and use Dyspnea scale;Understanding of Exercise Prescription;Increase Strength and Stamina;Knowledge  and understanding of Target Heart Rate Range (THRR);Able to understand and use rate of perceived exertion (RPE) scale Increase Physical Activity;Able to understand and use Dyspnea scale;Understanding of Exercise Prescription;Increase Strength and Stamina;Knowledge and understanding of Target Heart Rate Range (THRR);Able to understand and use rate of perceived exertion (RPE) scale Increase Physical Activity;Able to understand and use Dyspnea scale;Understanding of Exercise Prescription;Increase Strength and Stamina;Knowledge and understanding of Target Heart Rate Range (THRR);Able to understand and use rate of perceived exertion (RPE) scale    Comments Pt has completed 3 exercise sessions. She is motivated and tolerating well. She is exercising on the upright elliptical for 15 min, level 2, incline 1, METs 5.6. She then is walking on the treadmill for 15 min, 3.4 mph, 3% incline, METs 4.6. She completes warm up and cool down without limitations, currently using blue bands (5.8 lbs) but doubles the band for some exercises for 11 lbs. Will progress as tolerated. Pt has completed 11 exercise sessions. She is motivated and tolerating well. She is exercising on the upright elliptical for 30 min, level 3, incline 1, METs 6.1.  She perfers the elliptical for longer instead of doing two 15 min stations as she  feels it is more challenging.  She completes warm up and cool down without limitations, currently using black bands (7.3 lbs). Will progress as tolerated. Pt has completed 18 exercise sessions. She is motivated and tolerating well. She is exercising on the upright elliptical for 30 min, with intervals. Her max speed is 4 and incline is 8, max METs is 7.2. She decreases level when her HR increases to 125.   She completes warm up and cool down without limitations, currently using black bands (7.3 lbs). Will progress as tolerated. She plans to continue exercise at the gym when she graduates. Pt completed 23 exercise sessions.  She exercised on the upright elliptical for 30 min, with intervals. Her max speed is 4 and incline is 8, max METs is 7.2. She increased her 6 min walk test by 3.47%, her VO2 peak increasing from 12.1 to 13.5. She has significantly increased in exercise confidence. Her SOB score went from 42 to 8. Her CAT went from 20 to 16. She is consistently using black bands and plans to go to [facility] for exercise. She has been very successful in the program.    Expected Outcomes Through exercise at rehab and home, the patient will decrease shortness of breath with daily activities and feel confident in carrying out an exercise regimen at home. Through exercise at rehab and home, the patient will decrease shortness of breath with daily activities and feel confident in carrying out an exercise regimen at home. Through exercise at rehab and home, the patient will decrease shortness of breath with daily activities and feel confident in carrying out an exercise regimen at home. Through exercise at rehab and home, the patient will decrease shortness of breath with daily activities and feel confident in carrying out an exercise regimen at home.             Nutrition & Weight - Outcomes:  Pre Biometrics - 12/15/23 1430       Pre Biometrics   Grip Strength 22 kg              Nutrition:   Nutrition Therapy & Goals - 02/27/24 1123       Nutrition Therapy   Diet Heart Healthy Diet      Personal Nutrition Goals   Nutrition Goal Patient  to improve diet quality by using the plate method as a guide for meal planning to include lean protein/plant protein, fruits, vegetables, whole grains, nonfat dairy as part of a well-balanced diet.   goal in progress   Personal Goal #2 Patient to identify strategies for weight loss of 0.5-2.0# per week.   goal in action.   Personal Goal #3 Patient to limit sodium intake to 2300mg  per day   goal in progress   Comments Goals in progress. [name] has medical history of hyperlipidemia, CHF, DOE, hypothyroidism. [name] reports concern of weight gain; we discussed weight monitoring and reading food label for sodium. We further discussed strategies for weight loss including calorie density, decreasing calorie dense foods, protein supplements, etc.  She does report drinking>32oz of 2% milk/chocolate milk daily; she is pre-contemplative toward changing this aspect of her diet. She reports that she has decreased eating ice cream over the last three weeks. She is down 6.8# since starting with our program.  Lipids are at goal. Patient will benefit from participation in pulmonary rehab for nutrition, exercise, and lifestyle modification.      Intervention Plan   Intervention Prescribe, educate and counsel regarding individualized specific dietary modifications aiming towards targeted core components such as weight, hypertension, lipid management, diabetes, heart failure and other comorbidities.;Nutrition handout(s) given to patient.    Expected Outcomes Short Term Goal: Understand basic principles of dietary content, such as calories, fat, sodium, cholesterol and nutrients.;Long Term Goal: Adherence to prescribed nutrition plan.             Nutrition Discharge:  Nutrition Assessments - 12/21/23 1621       Rate Your Plate Scores   Pre Score 53              Education Questionnaire Score:  Knowledge Questionnaire Score - 03/12/24 1538       Knowledge Questionnaire Score   Post Score 14/18             Goals reviewed with patient; copy given to patient.

## 2024-03-25 DIAGNOSIS — Z1231 Encounter for screening mammogram for malignant neoplasm of breast: Secondary | ICD-10-CM | POA: Diagnosis not present

## 2024-03-26 DIAGNOSIS — Z23 Encounter for immunization: Secondary | ICD-10-CM | POA: Diagnosis not present

## 2024-04-01 ENCOUNTER — Ambulatory Visit (INDEPENDENT_AMBULATORY_CARE_PROVIDER_SITE_OTHER): Admitting: Gastroenterology

## 2024-04-01 ENCOUNTER — Encounter: Payer: Self-pay | Admitting: Gastroenterology

## 2024-04-01 VITALS — BP 122/72 | HR 77 | Ht 64.0 in | Wt 158.0 lb

## 2024-04-01 DIAGNOSIS — R159 Full incontinence of feces: Secondary | ICD-10-CM

## 2024-04-01 DIAGNOSIS — I428 Other cardiomyopathies: Secondary | ICD-10-CM | POA: Diagnosis not present

## 2024-04-01 DIAGNOSIS — I5042 Chronic combined systolic (congestive) and diastolic (congestive) heart failure: Secondary | ICD-10-CM

## 2024-04-01 DIAGNOSIS — Z860101 Personal history of adenomatous and serrated colon polyps: Secondary | ICD-10-CM | POA: Diagnosis not present

## 2024-04-01 DIAGNOSIS — R194 Change in bowel habit: Secondary | ICD-10-CM

## 2024-04-01 DIAGNOSIS — I479 Paroxysmal tachycardia, unspecified: Secondary | ICD-10-CM | POA: Diagnosis not present

## 2024-04-01 DIAGNOSIS — Z860100 Personal history of colon polyps, unspecified: Secondary | ICD-10-CM

## 2024-04-01 DIAGNOSIS — R1013 Epigastric pain: Secondary | ICD-10-CM

## 2024-04-01 DIAGNOSIS — I5022 Chronic systolic (congestive) heart failure: Secondary | ICD-10-CM

## 2024-04-01 NOTE — Progress Notes (Signed)
 Chief Complaint: change in bowel habits, fecal leakage Primary GI MD: Dr. Howard Macho  HPI: Discussed the use of AI scribe software for clinical note transcription with the patient, who gave verbal consent to proceed.  History of Present Illness [name] is a 73 year old female who presents with changes in bowel habits and fecal leakage. She was referred by her endocrinologist for evaluation of bowel movement frequency and consistency.  She experiences bowel movements three to four times a day, with 'mud pie like' stools occurring about twice daily. There is a noticeable change in stool color to a 'blonder color'. She also reports fecal leakage, necessitating frequent changes of underwear. She has a history of constipation and is concerned about the impact of her new cholesterol medication, a statin, which she has been taking every other day for about four weeks.  She describes two episodes of severe pain in the past four weeks, occurring in the evening around 6 PM, lasting a couple of hours, and accompanied by a faint feeling. The pain was not typical of GERD as she did not experience any GERD symptoms during these episodes. She considered whether it could be related to dietary intake but was unsure.  Her past medical history includes hypothyroidism, for which her medication was recently increased from 37 mg to 50 mg due to some anomalies in her thyroid  levels. She is scheduled to follow up with her endocrinologist in early July to assess her thyroid  levels. She also completed pulmonary rehabilitation recently due to breathing difficulties while walking her dog, although she did not find it remarkably beneficial.  She has a family history of colon cancer, with an uncle who had the condition. She is up to date with her colonoscopies, with her last colonoscopy performed in 2023, which revealed a small precancerous polyp. She is not due for another colonoscopy until 2028.    PREVIOUS GI WORKUP    EGD 11/2021 for Dysphagia - Mild, non- specific distal gastritis. Biopsied to check for H. pylori.  - The examination was otherwise normal. - negative h pylori  Colonoscopy 11/2021 history of colon polyps (1.8cm TVA 2015) - One 3 mm polyp (TA) in the descending colon, removed with a cold snare. Resected and retrieved.  - The site of the 2015 ascending colon polypectomy was easily located and there was no residual polypoid mucosa at the site  - Diverticulosis in the entire examined colon.  - The examination was otherwise normal on direct and retroflexion views. - repeat 5 years  Past Medical History:  Diagnosis Date   Adenomatous polyp    Allergic rhinitis    Anxiety    AV Nodal Reentry Tachycardia    s/p RFCA GT 2/15   AVNRT (AV nodal re-entry tachycardia) (HCC) 11/06/2013   Calcified granuloma of lung    Cataract    CHF (congestive heart failure) (HCC)    Diverticulosis 01/2020   Gastritis    GERD (gastroesophageal reflux disease)    H/O radiofrequency ablation for complex left atrial arrhythmia 12/12/2013   Headache    Hematuria    Hemorrhoids    HTN (hypertension)    Hyperlipemia    Hypothyroidism    Insomnia    Iron deficiency    Irregular heart beat    Laryngopharyngeal reflux (LPR)    Migraine    Mitral valve prolapse    ?   Rectal leakage    1 time   Retinal tear of both eyes    Skin  lesion of breast    Vitamin D deficiency    Vulvodynia     Past Surgical History:  Procedure Laterality Date   ABLATION  11/21/2013   RFCA of AVNRT by Dr Carolynne Citron   BREAST LUMPECTOMY Left 2000   left breast-   CATARCT     RIGHT EYE IN 09/2015   COLONOSCOPY  2007, 2008   RETINAL DETACHMENT SURGERY     RIGHT EYE/SILICONE BUCKLE   RHINOPLASTY  1980   RIGHT HEART CATH N/A 04/07/2022   Procedure: RIGHT HEART CATH;  Surgeon: Kyra Phy, MD;  Location: [facility];  Service: Cardiovascular;  Laterality: N/A;   SEPTOPLASTY     1980   skin lesions     left  leg,right leg   SUPRAVENTRICULAR TACHYCARDIA ABLATION N/A 11/21/2013   Procedure: SUPRAVENTRICULAR TACHYCARDIA ABLATION;  Surgeon: Tammie Fall, MD;  Location: [facility] CATH LAB;  Service: Cardiovascular;  Laterality: N/A;    Current Outpatient Medications  Medication Sig Dispense Refill   Bempedoic Acid-Ezetimibe (NEXLIZET ) 180-10 MG TABS Take 1 tablet by mouth at bedtime. (Patient taking differently: Take 1 tablet by mouth every other day.) 90 tablet 3   Cholecalciferol (VITAMIN D3) 50 MCG (2000 UT) capsule Take 2,000 Units by mouth daily.     ciclopirox (LOPROX) 0.77 % cream Apply 1 application. topically 2 (two) times daily as needed (foot fungus).     clotrimazole (LOTRIMIN) 1 % cream as needed.     metoprolol  succinate (TOPROL -XL) 25 MG 24 hr tablet Take 1 tablet (25 mg total) by mouth daily. Pt takes 1/2 tablet once a day. 90 tablet 3   Multiple Vitamins-Minerals (MULTIVITAMINS THER. W/MINERALS) TABS tablet Take 1 tablet by mouth daily.     TIROSINT  37.5 MCG CAPS Take 1 capsule by mouth every morning.     No current facility-administered medications for this visit.    Allergies as of 04/01/2024 - Review Complete 04/01/2024  Allergen Reaction Noted   Crestor  [rosuvastatin  calcium ] Other (See Comments) 05/28/2018   Pravastatin Other (See Comments) 05/28/2018   Propranolol  Other (See Comments) 01/28/2022    Family History  Problem Relation Age of Onset   Heart attack Mother 11   Pancreatic cancer Mother    Heart attack Father 25   Heart attack Sister 58   Heart disease Sister    Hernia Paternal Aunt    Colon cancer Paternal Uncle 69   Heart attack Maternal Grandmother    Heart attack Paternal Grandmother    Diabetes Nephew    Other Son        Avascular acrosis   Clotting disorder Son    Rectal cancer Neg Hx    Stomach cancer Neg Hx    Esophageal cancer Neg Hx     Social History   Socioeconomic History   Marital status: Widowed    Spouse name: Not on file   Number of  children: 1   Years of education: Not on file   Highest education level: Not on file  Occupational History   Occupation: Retired  Tobacco Use   Smoking status: Never   Smokeless tobacco: Never  Vaping Use   Vaping status: Never Used  Substance and Sexual Activity   Alcohol use: Yes    Alcohol/week: 1.0 standard drink of alcohol    Types: 1 Glasses of wine per week    Comment: socially   Drug use: No   Sexual activity: Not on file  Other Topics Concern   Not  on file  Social History Narrative   Not on file   Social Drivers of Health   Financial Resource Strain: Not on file  Food Insecurity: Not on file  Transportation Needs: Not on file  Physical Activity: Not on file  Stress: Not on file  Social Connections: Not on file  Intimate Partner Violence: Not on file    Review of Systems:    Constitutional: No weight loss, fever, chills, weakness or fatigue HEENT: Eyes: No change in vision               Ears, Nose, Throat:  No change in hearing or congestion Skin: No rash or itching Cardiovascular: No chest pain, chest pressure or palpitations   Respiratory: No SOB or cough Gastrointestinal: See HPI and otherwise negative Genitourinary: No dysuria or change in urinary frequency Neurological: No headache, dizziness or syncope Musculoskeletal: No new muscle or joint pain Hematologic: No bleeding or bruising Psychiatric: No history of depression or anxiety    Physical Exam:  Vital signs: BP 122/72   Pulse 77   Ht 5' 4 (1.626 m)   Wt 158 lb (71.7 kg)   BMI 27.12 kg/m   Constitutional: NAD, alert and cooperative Head:  Normocephalic and atraumatic. Eyes:   PEERL, EOMI. No icterus. Conjunctiva pink. Respiratory: Respirations even and unlabored. Lungs clear to auscultation bilaterally.   No wheezes, crackles, or rhonchi.  Cardiovascular:  Regular rate and rhythm. No peripheral edema, cyanosis or pallor.  Gastrointestinal:  Soft, nondistended, nontender. No rebound or  guarding. Normal bowel sounds. No appreciable masses or hepatomegaly. Rectal:  Declines Msk:  Symmetrical without gross deformities. Without edema, no deformity or joint abnormality.  Neurologic:  Alert and  oriented x4;  grossly normal neurologically.  Skin:   Dry and intact without significant lesions or rashes. Psychiatric: Oriented to person, place and time. Demonstrates good judgement and reason without abnormal affect or behaviors.   RELEVANT LABS AND IMAGING: CBC    Component Value Date/Time   WBC 5.5 04/05/2022 0813   WBC 5.5 01/29/2022 0434   RBC 4.22 04/05/2022 0813   RBC 3.68 (L) 01/29/2022 0434   HGB 11.5 04/22/2022 1015   HCT 35.9 04/22/2022 1015   PLT 275 04/05/2022 0813   MCV 84 04/05/2022 0813   MCH 26.3 (L) 04/05/2022 0813   MCH 27.7 01/29/2022 0434   MCHC 31.4 (L) 04/05/2022 0813   MCHC 34.0 01/29/2022 0434   RDW 15.7 (H) 04/05/2022 0813   LYMPHSABS 1.5 01/25/2022 0907   MONOABS 0.4 01/25/2022 0907   EOSABS 0.0 01/25/2022 0907   BASOSABS 0.0 01/25/2022 0907    CMP     Component Value Date/Time   NA 141 01/19/2024 1532   K 4.4 01/19/2024 1532   CL 105 01/19/2024 1532   CO2 20 01/19/2024 1532   GLUCOSE 104 (H) 01/19/2024 1532   GLUCOSE 88 01/29/2022 1257   BUN 27 01/19/2024 1532   CREATININE 0.94 01/19/2024 1532   CALCIUM  9.5 01/19/2024 1532   PROT 7.3 01/25/2022 0907   PROT 6.1 01/21/2022 0807   ALBUMIN 4.1 01/25/2022 0907   ALBUMIN 3.7 (L) 01/21/2022 0807   AST 26 01/25/2022 0907   ALT 34 01/25/2022 0907   ALKPHOS 87 01/25/2022 0907   BILITOT 0.7 01/25/2022 0907   BILITOT 0.3 01/21/2022 0807   GFRNONAA >60 01/29/2022 1257   GFRAA >90 11/21/2013 0900     Assessment/Plan:   73 year old female history of chronic HFrEF, AVNRT s/p abalation 2015, MVP,  hyperlipidemia, hypothyroidism, presents for change in bowel habits and fecal leakage  Change in bowel habits Fecal leakage Currently struggling with adjusting thyroid  medications and having  mudpie like stools with occasional fecal leakage as well as diet low in fiber. Up to date on colonoscopy. No bleeding/weight loss. Suspect multifactorial change in bowel habits as a result of low fiber, thyroid  function (being managed by endocrinology) and pelvic floor dysfunction. -- fiber supplement 1-2 times daily -- increase water, increase fiber, increase exercise -- pelvic floor physical therapy -- continue to adjust thyroid  medication through endocrinology -- follow up 12 weeks  Epigastric pain Two episodes of epigastric pain occurring around 6pm. Previous history of GERD well controlled through diet. No further episodes since -- use pepcid  as needed -- 3 hours after eating and lying flat  History of colon polyps Colonoscopy 11/2021 with one TA and recall of 5 years. Previous 1.8cm TVA in 2015. - repeat 11/2026  Non-Ischemic Cardiomyopathy Chronic Combined CHF ECHO 11/2023 with LVEF 60-65% with normal wall motion and grade 1 diastolic dysfunction, normal RV function, and only trivial MR.   Paroxysmal SVT  history of AVNRT s/p ablation in 2015. Repeat monitor in 02/2022 and 07/2022 showed short runs of paroxysmal SVT and non-sustained VT but no significant arrhythmias/ sustained arrhythmias.   Assigned to Dr. Cherryl Corona today (Monday)  [facility] 04/01/2024, 3:52 PM  Cc: Prevost, Mary Ellen, FNP

## 2024-04-01 NOTE — Patient Instructions (Addendum)
 A high fiber diet with plenty of fluids (up to 8 glasses of water daily) is suggested to relieve these symptoms.  Benefiber,  1 tablespoon once or twice daily can be used to keep bowels regular if needed.   Referral placed for pelvic flooor PT.   _______________________________________________________  If your blood pressure at your visit was 140/90 or greater, please contact your primary care physician to follow up on this.  _______________________________________________________  If you are age 72 or older, your body mass index should be between 23-30. Your Body mass index is 27.12 kg/m. If this is out of the aforementioned range listed, please consider follow up with your Primary Care Provider.  If you are age 66 or younger, your body mass index should be between 19-25. Your Body mass index is 27.12 kg/m. If this is out of the aformentioned range listed, please consider follow up with your Primary Care Provider.   ________________________________________________________  The [facility] providers would like to encourage you to use MYCHART to communicate with providers for non-urgent requests or questions.  Due to long hold times on the telephone, sending your provider a message by [facility] may be a faster and more efficient way to get a response.  Please allow 48 business hours for a response.  Please remember that this is for non-urgent requests.  _______________________________________________________

## 2024-04-08 NOTE — Progress Notes (Signed)
 Agree with the assessment and plan as outlined by Nestor Blower, PA-C.  With severe, episodic upper abdominal pain, gallstones should be considered.  Would recommend RUQUS to exclude cholelithiasis.

## 2024-04-17 DIAGNOSIS — E039 Hypothyroidism, unspecified: Secondary | ICD-10-CM | POA: Diagnosis not present

## 2024-04-22 DIAGNOSIS — E559 Vitamin D deficiency, unspecified: Secondary | ICD-10-CM | POA: Diagnosis not present

## 2024-04-22 DIAGNOSIS — E039 Hypothyroidism, unspecified: Secondary | ICD-10-CM | POA: Diagnosis not present

## 2024-04-22 DIAGNOSIS — M810 Age-related osteoporosis without current pathological fracture: Secondary | ICD-10-CM | POA: Diagnosis not present

## 2024-06-06 ENCOUNTER — Ambulatory Visit: Attending: Gastroenterology | Admitting: Physical Therapy

## 2024-06-06 DIAGNOSIS — M6281 Muscle weakness (generalized): Secondary | ICD-10-CM | POA: Diagnosis not present

## 2024-06-06 DIAGNOSIS — R279 Unspecified lack of coordination: Secondary | ICD-10-CM | POA: Diagnosis not present

## 2024-06-06 DIAGNOSIS — R293 Abnormal posture: Secondary | ICD-10-CM | POA: Insufficient documentation

## 2024-06-06 DIAGNOSIS — M62838 Other muscle spasm: Secondary | ICD-10-CM | POA: Diagnosis not present

## 2024-06-06 NOTE — Therapy (Signed)
 OUTPATIENT PHYSICAL THERAPY FEMALE PELVIC EVALUATION   Patient Name: [name] MRN: [id number] DOB:01-25-1951, 73 y.o., female Today's Date: 06/06/2024  END OF SESSION:  PT End of Session - 06/06/24 0931     Visit Number 1    Date for PT Re-Evaluation 09/06/24    Authorization Type MEDICARE    PT Start Time 0930    PT Stop Time 1010    PT Time Calculation (min) 40 min    Activity Tolerance Patient tolerated treatment well    Behavior During Therapy [facility] for tasks assessed/performed          Past Medical History:  Diagnosis Date   Adenomatous polyp    Allergic rhinitis    Anxiety    AV Nodal Reentry Tachycardia    s/p RFCA GT 2/15   AVNRT (AV nodal re-entry tachycardia) (HCC) 11/06/2013   Calcified granuloma of lung    Cataract    CHF (congestive heart failure) (HCC)    Diverticulosis 01/2020   Gastritis    GERD (gastroesophageal reflux disease)    H/O radiofrequency ablation for complex left atrial arrhythmia 12/12/2013   Headache    Hematuria    Hemorrhoids    HTN (hypertension)    Hyperlipemia    Hypothyroidism    Insomnia    Iron deficiency    Irregular heart beat    Laryngopharyngeal reflux (LPR)    Migraine    Mitral valve prolapse    ?   Rectal leakage    1 time   Retinal tear of both eyes    Skin lesion of breast    Vitamin D deficiency    Vulvodynia    Past Surgical History:  Procedure Laterality Date   ABLATION  11/21/2013   RFCA of AVNRT by Dr Waddell   BREAST LUMPECTOMY Left 2000   left breast-   CATARCT     RIGHT EYE IN 09/2015   COLONOSCOPY  2007, 2008   RETINAL DETACHMENT SURGERY     RIGHT EYE/SILICONE BUCKLE   RHINOPLASTY  1980   RIGHT HEART CATH N/A 04/07/2022   Procedure: RIGHT HEART CATH;  Surgeon: Wendel Lurena POUR, MD;  Location: [facility] INVASIVE CV LAB;  Service: Cardiovascular;  Laterality: N/A;   SEPTOPLASTY     1980   skin lesions     left leg,right leg   SUPRAVENTRICULAR TACHYCARDIA ABLATION N/A 11/21/2013   Procedure:  SUPRAVENTRICULAR TACHYCARDIA ABLATION;  Surgeon: Danelle LELON Waddell, MD;  Location: [facility] CATH LAB;  Service: Cardiovascular;  Laterality: N/A;   Patient Active Problem List   Diagnosis Date Noted   Iron deficiency anemia secondary to inadequate dietary iron intake 05/05/2022   Chronic systolic heart failure (HCC) 05/05/2022   Hyponatremia 01/28/2022   Atypical chest pain 12/12/2018   Laboratory examination 12/12/2018   Hypothyroidism 05/29/2018   Paradoxical insomnia 05/29/2018   Non-restorative sleep 05/29/2018   Degenerative tear of medial meniscus, right 02/02/2017   Abnormality of gait 12/08/2016   Tendinopathy of left gluteus medius 12/08/2016   Leg length discrepancy 10/27/2016   Hallux limitus of left foot 10/27/2016   H/O radiofrequency ablation for complex left atrial arrhythmia 12/12/2013   HYPERLIPIDEMIA TYPE I / IV 12/30/2010   HYPERLIPIDEMIA-MIXED 12/30/2010    PCP: Royden Ronal Czar, FNP   REFERRING PROVIDER: Mollie Nestor HERO, PA-C   REFERRING DIAG: R15.9 (ICD-10-CM) - Incontinence of feces, unspecified fecal incontinence type  THERAPY DIAG:  Muscle weakness (generalized)  Unspecified lack of coordination  Other muscle spasm  Abnormal posture  Rationale for Evaluation and Treatment: Rehabilitation  ONSET DATE: 15 years ago  SUBJECTIVE:                                                                                                                                                                                           SUBJECTIVE STATEMENT: Did have issues with urinary incontinence about 15 years ago, then returned during COVID with more in depth testing to see why leakage was occurring. Now has worsened to fecal leakage as well. Was told to attempt metamucil and has helped slightly but still there and now urinary incontinence at night sometimes, sneezing as well.   Fluid intake: water - limited to no more than 64 oz due to heart restrictions; 2x cups decaf  hot tea; 2-3 moca capachinos daily   PAIN:  Are you having pain? No   PRECAUTIONS: None  RED FLAGS: None   WEIGHT BEARING RESTRICTIONS: No  FALLS:  Has patient fallen in last 6 months? No  OCCUPATION: retired  ACTIVITY LEVEL : moderate 2x weekly trainer workouts, walks dog daily   PLOF: Independent  PATIENT GOALS: to have no leakage  PERTINENT HISTORY:  Vulvodynia,Hemorrhoids,Diverticulosis,Anxiety Sexual abuse: No  BOWEL MOVEMENT: Pain with bowel movement: No Type of bowel movement:Type (Bristol Stool Scale) 5 (but very small soft pieces), Frequency was 3-4x daily eariler in the year but now ~2-3x, and Strain not now but historically yes Fully empty rectum: Yes:  needs several wipes to get clean Leakage: Yes: a very small pea size amount  Pads: No Fiber supplement/laxative Yes   URINATION: Pain with urination: No Fully empty bladder: Yes:   Stream: Strong Urgency: No Frequency: 2 hours but does just go as needed; 1x nightly Leakage: Sneezing and night time Pads: No  INTERCOURSE:  Ability to have vaginal penetration Yes  Pain with intercourse: Initial Penetration, During Penetration, and Deep Penetration DrynessYes  Climax: not active Marinoff Scale: 2/3  PREGNANCY: Vaginal deliveries 1 C-section deliveries 0 Currently pregnant No  PROLAPSE: None   OBJECTIVE:  Note: Objective measures were completed at Evaluation unless otherwise noted.  DIAGNOSTIC FINDINGS:    COGNITION: Overall cognitive status: Within functional limits for tasks assessed     SENSATION: Light touch: Appears intact  LUMBAR SPECIAL TESTS:  Single leg stance test: 4s on Lt 6s on Rt mild hip drop  FUNCTIONAL TESTS:  Functional squat - decreased decent by 50% and mild bil knee valgus  GAIT: WFL  POSTURE: rounded shoulders   LUMBARAROM/PROM:  A/PROM A/PROM  eval  Flexion WFL  Extension WFL  Right lateral flexion Limited by 25%  Left lateral  flexion Limited by 25%   Right rotation Limited by 25%  Left rotation Limited by 25%   (Blank rows = not tested)  LOWER EXTREMITY ROM:  Bil hamstrings and adductors limited by 25%  LOWER EXTREMITY MMT:  Bil hips grossly 4/5 PALPATION:   General: no TTP  Pelvic Alignment: WFl  Abdominal: tension throughout and mild TTP LLQ                External Perineal Exam: skin dryness and mild redness around rectum                             Internal Pelvic Floor: TTP toward coccyx   Patient confirms identification and approves PT to assess internal pelvic floor and treatment Yes No emotional/communication barriers or cognitive limitation. Patient is motivated to learn. Patient understands and agrees with treatment goals and plan. PT explains patient will be examined in standing, sitting, and lying down to see how their muscles and joints work. When they are ready, they will be asked to remove their underwear so PT can examine their perineum. The patient is also given the option of providing their own chaperone as one is not provided in our facility. The patient also has the right and is explained the right to defer or refuse any part of the evaluation or treatment including the internal exam. With the patient's consent, PT will use one gloved finger to gently assess the muscles of the pelvic floor, seeing how well it contracts and relaxes and if there is muscle symmetry. After, the patient will get dressed and PT and patient will discuss exam findings and plan of care. PT and patient discuss plan of care, schedule, attendance policy and HEP activities.  PELVIC MMT:   MMT eval  Vaginal   Internal Anal Sphincter 2/5, 8s, 4 reps  External Anal Sphincter 2/5, 8s, 4 reps  Puborectalis 1/5  Diastasis Recti   (Blank rows = not tested)        TONE: Decreased   PROLAPSE: Not seen rectally   TODAY'S TREATMENT:                                                                                                                               DATE:   06/06/24 EVAL Examination completed, findings reviewed, pt educated on POC, HEP, and pelvic floor anatomy pelvic floor PT  role in care, voiding mechanics. Pt had several questions about above education, all answered but did require extra time of session and pt denied any additional questions at end of session, verbalized understanding. Pt motivated to participate in PT and agreeable to attempt recommendations.     PATIENT EDUCATION:  Education details: [phone] Person educated: Patient Education method: Explanation, Demonstration, Tactile cues, Verbal cues, and Handouts Education comprehension: verbalized understanding, returned demonstration, verbal cues required, tactile cues required, and needs further education  HOME EXERCISE PROGRAM: [id number]  ASSESSMENT:  CLINICAL IMPRESSION: Patient is a 73 y.o. female  who was seen today for physical therapy evaluation and treatment for fecal and urinary incontinence. Pt reports small amounts of fecal incontinence post bowel movements (does have several daily) and very bothered by this as it makes her fearful for social activities with friends. Pt found to have decreased core and hip strength, decreased hip and spinal flexibility, and slightly decreased pelvic stability. Patient consented to internal pelvic floor assessment rectally this date and found to have decreased strength, endurance, and coordination and TTP. Patient benefited from verbal cues for improved technique with pelvic floor contractions and lengthening. Pt would benefit from additional PT to further address deficits.    OBJECTIVE IMPAIRMENTS: decreased activity tolerance, decreased coordination, decreased endurance, decreased mobility, decreased strength, impaired flexibility, improper body mechanics, postural dysfunction, and pain.   ACTIVITY LIMITATIONS: continence  PARTICIPATION LIMITATIONS: community activity  PERSONAL FACTORS: Time since onset of  injury/illness/exacerbation are also affecting patient's functional outcome.   REHAB POTENTIAL: Good  CLINICAL DECISION MAKING: Stable/uncomplicated  EVALUATION COMPLEXITY: Low   GOALS: Goals reviewed with patient? Yes  SHORT TERM GOALS: Target date: 07/04/24  Pt to be I with HEP for carry over and continuing recommendations for improved outcomes.   Baseline: Goal status: INITIAL  2.  Pt will be independent with use of squatty potty, relaxed toileting mechanics, and improved bowel movement techniques in order to increase ease of bowel movements and complete evacuation.   Baseline:  Goal status: INITIAL  3.  Pt will be independent with the knack, urge suppression technique, and double voiding in order to improve bladder habits and decrease urinary incontinence.   Baseline:  Goal status: INITIAL  LONG TERM GOALS: Target date: 09/06/24  Pt to be I with  advanced HEP for carry over and continuing recommendations for improved outcomes.   Baseline:  Goal status: INITIAL  2.  Pt will report her BMs are complete due to improved bowel habits and evacuation techniques at least 75% of the time for improved bowel health and more confidence with community outings.  Baseline:  Goal status: INITIAL  3.  Pt to demonstrate improved coordination of pelvic floor and breathing mechanics with 10# squat with appropriate synergistic patterns to decrease pain and leakage at least 75% of the time for improved ability to complete a 30 minute walk without strain at pelvic floor and symptoms.    Baseline:  Goal status: INITIAL  4.  Pt to report no more than 1 instance of fecal leakage in a month for improved confidence with community outings.  Baseline:  Goal status: INITIAL  5.  Pt to be I with pressure management techniques to decreased strain at pelvic floor with lifting at least 15# for lifting her dog without pain or leakage. Baseline:  Goal status: INITIAL    PLAN:  PT FREQUENCY:  1x/week  PT DURATION: 8 sessions  PLANNED INTERVENTIONS: 97110-Therapeutic exercises, 97530- Therapeutic activity, 97112- Neuromuscular re-education, 97535- Self Care, [id number]- Manual therapy, [phone]- Canalith repositioning, [id number]- Aquatic Therapy, [phone]- Electrical stimulation (manual), [phone] (1-2 muscles), [phone] (3+ muscles)- Dry Needling, Patient/Family education, Taping, Joint mobilization, Spinal mobilization, Scar mobilization, DME instructions, Cryotherapy, Moist heat, and Biofeedback  PLAN FOR NEXT SESSION: coordination of pelvic floor and breathing, hip and core strengthening, lifting and voiding mechanics   Darryle Navy, PT, DPT 06/07/2511:14 PM  [facility] [address] [place], [place] [address] Phone # [phone] Fax [phone]

## 2024-07-04 ENCOUNTER — Ambulatory Visit: Attending: Gastroenterology | Admitting: Physical Therapy

## 2024-07-04 DIAGNOSIS — R279 Unspecified lack of coordination: Secondary | ICD-10-CM | POA: Diagnosis present

## 2024-07-04 DIAGNOSIS — M6281 Muscle weakness (generalized): Secondary | ICD-10-CM | POA: Diagnosis present

## 2024-07-04 DIAGNOSIS — R293 Abnormal posture: Secondary | ICD-10-CM | POA: Diagnosis present

## 2024-07-04 NOTE — Therapy (Signed)
 OUTPATIENT PHYSICAL THERAPY FEMALE PELVIC TREATMENT   Patient Name: [name] MRN: [id number] DOB:28-Nov-1950, 73 y.o., female Today's Date: 07/04/2024  END OF SESSION:  PT End of Session - 07/04/24 1051     Visit Number 2    Date for Recertification  09/06/24    Authorization Type MEDICARE    Progress Note Due on Visit 10    PT Start Time 1100    PT Stop Time 1142    PT Time Calculation (min) 42 min    Activity Tolerance Patient tolerated treatment well    Behavior During Therapy [facility] for tasks assessed/performed          Past Medical History:  Diagnosis Date   Adenomatous polyp    Allergic rhinitis    Anxiety    AV Nodal Reentry Tachycardia    s/p RFCA GT 2/15   AVNRT (AV nodal re-entry tachycardia) (HCC) 11/06/2013   Calcified granuloma of lung    Cataract    CHF (congestive heart failure) (HCC)    Diverticulosis 01/2020   Gastritis    GERD (gastroesophageal reflux disease)    H/O radiofrequency ablation for complex left atrial arrhythmia 12/12/2013   Headache    Hematuria    Hemorrhoids    HTN (hypertension)    Hyperlipemia    Hypothyroidism    Insomnia    Iron deficiency    Irregular heart beat    Laryngopharyngeal reflux (LPR)    Migraine    Mitral valve prolapse    ?   Rectal leakage    1 time   Retinal tear of both eyes    Skin lesion of breast    Vitamin D deficiency    Vulvodynia    Past Surgical History:  Procedure Laterality Date   ABLATION  11/21/2013   RFCA of AVNRT by Dr Waddell   BREAST LUMPECTOMY Left 2000   left breast-   CATARCT     RIGHT EYE IN 09/2015   COLONOSCOPY  2007, 2008   RETINAL DETACHMENT SURGERY     RIGHT EYE/SILICONE BUCKLE   RHINOPLASTY  1980   RIGHT HEART CATH N/A 04/07/2022   Procedure: RIGHT HEART CATH;  Surgeon: Wendel Lurena POUR, MD;  Location: [facility] INVASIVE CV LAB;  Service: Cardiovascular;  Laterality: N/A;   SEPTOPLASTY     1980   skin lesions     left leg,right leg   SUPRAVENTRICULAR TACHYCARDIA  ABLATION N/A 11/21/2013   Procedure: SUPRAVENTRICULAR TACHYCARDIA ABLATION;  Surgeon: Danelle LELON Waddell, MD;  Location: [facility] CATH LAB;  Service: Cardiovascular;  Laterality: N/A;   Patient Active Problem List   Diagnosis Date Noted   Iron deficiency anemia secondary to inadequate dietary iron intake 05/05/2022   Chronic systolic heart failure (HCC) 05/05/2022   Hyponatremia 01/28/2022   Atypical chest pain 12/12/2018   Laboratory examination 12/12/2018   Hypothyroidism 05/29/2018   Paradoxical insomnia 05/29/2018   Non-restorative sleep 05/29/2018   Degenerative tear of medial meniscus, right 02/02/2017   Abnormality of gait 12/08/2016   Tendinopathy of left gluteus medius 12/08/2016   Leg length discrepancy 10/27/2016   Hallux limitus of left foot 10/27/2016   H/O radiofrequency ablation for complex left atrial arrhythmia 12/12/2013   HYPERLIPIDEMIA TYPE I / IV 12/30/2010   HYPERLIPIDEMIA-MIXED 12/30/2010    PCP: Royden Ronal Czar, FNP   REFERRING PROVIDER: Mollie Nestor HERO, PA-C   REFERRING DIAG: R15.9 (ICD-10-CM) - Incontinence of feces, unspecified fecal incontinence type  THERAPY DIAG:  Muscle weakness (generalized)  Unspecified lack of coordination  Abnormal posture  Rationale for Evaluation and Treatment: Rehabilitation  ONSET DATE: 15 years ago  SUBJECTIVE:                                                                                                                                                                                           SUBJECTIVE STATEMENT: Still using benefiber and still type 5, and now 1-2x daily which is better. Fecal leakage is still happening but about 50% less often.  Urinary incontinence about the same but not her biggest concern.   Fluid intake: water - limited to no more than 64 oz due to heart restrictions; 2x cups decaf hot tea; 2-3 moca capachinos daily   PAIN:  Are you having pain? No   PRECAUTIONS: None  RED  FLAGS: None   WEIGHT BEARING RESTRICTIONS: No  FALLS:  Has patient fallen in last 6 months? No  OCCUPATION: retired  ACTIVITY LEVEL : moderate 2x weekly trainer workouts, walks dog daily   PLOF: Independent  PATIENT GOALS: to have no leakage  PERTINENT HISTORY:  Vulvodynia,Hemorrhoids,Diverticulosis,Anxiety Sexual abuse: No  BOWEL MOVEMENT: Pain with bowel movement: No Type of bowel movement:Type (Bristol Stool Scale) 5 (but very small soft pieces), Frequency was 3-4x daily eariler in the year but now ~2-3x, and Strain not now but historically yes Fully empty rectum: Yes:  needs several wipes to get clean Leakage: Yes: a very small pea size amount  Pads: No Fiber supplement/laxative Yes   URINATION: Pain with urination: No Fully empty bladder: Yes:   Stream: Strong Urgency: No Frequency: 2 hours but does just go as needed; 1x nightly Leakage: Sneezing and night time Pads: No  INTERCOURSE:  Ability to have vaginal penetration Yes  Pain with intercourse: Initial Penetration, During Penetration, and Deep Penetration DrynessYes  Climax: not active Marinoff Scale: 2/3  PREGNANCY: Vaginal deliveries 1 C-section deliveries 0 Currently pregnant No  PROLAPSE: None   OBJECTIVE:  Note: Objective measures were completed at Evaluation unless otherwise noted.  DIAGNOSTIC FINDINGS:    COGNITION: Overall cognitive status: Within functional limits for tasks assessed     SENSATION: Light touch: Appears intact  LUMBAR SPECIAL TESTS:  Single leg stance test: 4s on Lt 6s on Rt mild hip drop  FUNCTIONAL TESTS:  Functional squat - decreased decent by 50% and mild bil knee valgus  GAIT: WFL  POSTURE: rounded shoulders   LUMBARAROM/PROM:  A/PROM A/PROM  eval  Flexion WFL  Extension WFL  Right lateral flexion Limited by 25%  Left lateral flexion Limited by 25%  Right rotation Limited by 25%  Left rotation Limited by  25%   (Blank rows = not tested)  LOWER  EXTREMITY ROM:  Bil hamstrings and adductors limited by 25%  LOWER EXTREMITY MMT:  Bil hips grossly 4/5 PALPATION:   General: no TTP  Pelvic Alignment: WFl  Abdominal: tension throughout and mild TTP LLQ                External Perineal Exam: skin dryness and mild redness around rectum                             Internal Pelvic Floor: TTP toward coccyx   Patient confirms identification and approves PT to assess internal pelvic floor and treatment Yes No emotional/communication barriers or cognitive limitation. Patient is motivated to learn. Patient understands and agrees with treatment goals and plan. PT explains patient will be examined in standing, sitting, and lying down to see how their muscles and joints work. When they are ready, they will be asked to remove their underwear so PT can examine their perineum. The patient is also given the option of providing their own chaperone as one is not provided in our facility. The patient also has the right and is explained the right to defer or refuse any part of the evaluation or treatment including the internal exam. With the patient's consent, PT will use one gloved finger to gently assess the muscles of the pelvic floor, seeing how well it contracts and relaxes and if there is muscle symmetry. After, the patient will get dressed and PT and patient will discuss exam findings and plan of care. PT and patient discuss plan of care, schedule, attendance policy and HEP activities.  PELVIC MMT:   MMT eval  Vaginal   Internal Anal Sphincter 2/5, 8s, 4 reps  External Anal Sphincter 2/5, 8s, 4 reps  Puborectalis 1/5  Diastasis Recti   (Blank rows = not tested)        TONE: Decreased   PROLAPSE: Not seen rectally   TODAY'S TREATMENT:                                                                                                                              DATE:   06/06/24 EVAL Examination completed, findings reviewed, pt educated on POC,  HEP, and pelvic floor anatomy pelvic floor PT  role in care, voiding mechanics. Pt had several questions about above education, all answered but did require extra time of session and pt denied any additional questions at end of session, verbalized understanding. Pt motivated to participate in PT and agreeable to attempt recommendations.    07/04/24: Reviewed HEP - pt completed x10 pelvic floor quick flicks, x10 10s holds, and x10 diaphragmatic breathing all in sitting Then had several questions about pelvic floor dynamics, coordination, and how to implement with personal trainer. All answered. And updated HEP for lunges, squats, and bridges to do with trainer as well.  Pt also educated  on balloon breathing and abdominal massage, PT completed in session and pt demonstrated good carry over.   PATIENT EDUCATION:  Education details: [id number] Person educated: Patient Education method: Explanation, Demonstration, Tactile cues, Verbal cues, and Handouts Education comprehension: verbalized understanding, returned demonstration, verbal cues required, tactile cues required, and needs further education  HOME EXERCISE PROGRAM: [id number]  ASSESSMENT:  CLINICAL IMPRESSION: Patient is a 73 y.o. female  who was seen today for physical therapy treatment for fecal and urinary incontinence. Pt has seen improvement so far but still present. Pt tolerated session well and had all questions answered. Extra time for education and review of HEP. Demonstrated good techniques with exercises in clinic but did need cues for coordination of pelvic floor with exercises. Pt would benefit from additional PT to further address deficits.    OBJECTIVE IMPAIRMENTS: decreased activity tolerance, decreased coordination, decreased endurance, decreased mobility, decreased strength, impaired flexibility, improper body mechanics, postural dysfunction, and pain.   ACTIVITY LIMITATIONS: continence  PARTICIPATION LIMITATIONS: community  activity  PERSONAL FACTORS: Time since onset of injury/illness/exacerbation are also affecting patient's functional outcome.   REHAB POTENTIAL: Good  CLINICAL DECISION MAKING: Stable/uncomplicated  EVALUATION COMPLEXITY: Low   GOALS: Goals reviewed with patient? Yes  SHORT TERM GOALS: Target date: 07/04/24  Pt to be I with HEP for carry over and continuing recommendations for improved outcomes.   Baseline: Goal status: INITIAL  2.  Pt will be independent with use of squatty potty, relaxed toileting mechanics, and improved bowel movement techniques in order to increase ease of bowel movements and complete evacuation.   Baseline:  Goal status: INITIAL  3.  Pt will be independent with the knack, urge suppression technique, and double voiding in order to improve bladder habits and decrease urinary incontinence.   Baseline:  Goal status: INITIAL  LONG TERM GOALS: Target date: 09/06/24  Pt to be I with  advanced HEP for carry over and continuing recommendations for improved outcomes.   Baseline:  Goal status: INITIAL  2.  Pt will report her BMs are complete due to improved bowel habits and evacuation techniques at least 75% of the time for improved bowel health and more confidence with community outings.  Baseline:  Goal status: INITIAL  3.  Pt to demonstrate improved coordination of pelvic floor and breathing mechanics with 10# squat with appropriate synergistic patterns to decrease pain and leakage at least 75% of the time for improved ability to complete a 30 minute walk without strain at pelvic floor and symptoms.    Baseline:  Goal status: INITIAL  4.  Pt to report no more than 1 instance of fecal leakage in a month for improved confidence with community outings.  Baseline:  Goal status: INITIAL  5.  Pt to be I with pressure management techniques to decreased strain at pelvic floor with lifting at least 15# for lifting her dog without pain or leakage. Baseline:  Goal  status: INITIAL    PLAN:  PT FREQUENCY: 1x/week  PT DURATION: 8 sessions  PLANNED INTERVENTIONS: 97110-Therapeutic exercises, 97530- Therapeutic activity, 97112- Neuromuscular re-education, 97535- Self Care, [id number]- Manual therapy, [phone]- Canalith repositioning, [id number]- Aquatic Therapy, [phone]- Electrical stimulation (manual), [phone] (1-2 muscles), [id number] (3+ muscles)- Dry Needling, Patient/Family education, Taping, Joint mobilization, Spinal mobilization, Scar mobilization, DME instructions, Cryotherapy, Moist heat, and Biofeedback  PLAN FOR NEXT SESSION: coordination of pelvic floor and breathing, hip and core strengthening, lifting and voiding mechanics   Darryle Navy, PT, DPT 07/05/2511:42 PM  [facility] [address]  [address] [place], [place] [address] Phone # [phone] Fax [phone]

## 2024-07-11 ENCOUNTER — Encounter: Admitting: Physical Therapy

## 2024-07-18 DIAGNOSIS — Z23 Encounter for immunization: Secondary | ICD-10-CM | POA: Diagnosis not present

## 2024-07-22 ENCOUNTER — Ambulatory Visit: Attending: Gastroenterology | Admitting: Physical Therapy

## 2024-07-22 DIAGNOSIS — R279 Unspecified lack of coordination: Secondary | ICD-10-CM | POA: Insufficient documentation

## 2024-07-22 DIAGNOSIS — R293 Abnormal posture: Secondary | ICD-10-CM | POA: Diagnosis not present

## 2024-07-22 DIAGNOSIS — M6281 Muscle weakness (generalized): Secondary | ICD-10-CM | POA: Diagnosis not present

## 2024-07-22 NOTE — Therapy (Signed)
 OUTPATIENT PHYSICAL THERAPY FEMALE PELVIC TREATMENT   Patient Name: [name] MRN: [id number] DOB:Feb 25, 1951, 73 y.o., female Today's Date: 07/22/2024  END OF SESSION:  PT End of Session - 07/22/24 1234     Visit Number 3    Date for Recertification  09/06/24    Authorization Type MEDICARE    Progress Note Due on Visit 10    PT Start Time 1232    PT Stop Time 1312    PT Time Calculation (min) 40 min    Activity Tolerance Patient tolerated treatment well    Behavior During Therapy [facility] for tasks assessed/performed           Past Medical History:  Diagnosis Date   Adenomatous polyp    Allergic rhinitis    Anxiety    AV Nodal Reentry Tachycardia    s/p RFCA GT 2/15   AVNRT (AV nodal re-entry tachycardia) 11/06/2013   Calcified granuloma of lung    Cataract    CHF (congestive heart failure) (HCC)    Diverticulosis 01/2020   Gastritis    GERD (gastroesophageal reflux disease)    H/O radiofrequency ablation for complex left atrial arrhythmia 12/12/2013   Headache    Hematuria    Hemorrhoids    HTN (hypertension)    Hyperlipemia    Hypothyroidism    Insomnia    Iron deficiency    Irregular heart beat    Laryngopharyngeal reflux (LPR)    Migraine    Mitral valve prolapse    ?   Rectal leakage    1 time   Retinal tear of both eyes    Skin lesion of breast    Vitamin D deficiency    Vulvodynia    Past Surgical History:  Procedure Laterality Date   ABLATION  11/21/2013   RFCA of AVNRT by Dr Waddell   BREAST LUMPECTOMY Left 2000   left breast-   CATARCT     RIGHT EYE IN 09/2015   COLONOSCOPY  2007, 2008   RETINAL DETACHMENT SURGERY     RIGHT EYE/SILICONE BUCKLE   RHINOPLASTY  1980   RIGHT HEART CATH N/A 04/07/2022   Procedure: RIGHT HEART CATH;  Surgeon: Wendel Lurena POUR, MD;  Location: [facility];  Service: Cardiovascular;  Laterality: N/A;   SEPTOPLASTY     1980   skin lesions     left leg,right leg   SUPRAVENTRICULAR TACHYCARDIA ABLATION  N/A 11/21/2013   Procedure: SUPRAVENTRICULAR TACHYCARDIA ABLATION;  Surgeon: Danelle LELON Waddell, MD;  Location: [facility] CATH LAB;  Service: Cardiovascular;  Laterality: N/A;   Patient Active Problem List   Diagnosis Date Noted   Iron deficiency anemia secondary to inadequate dietary iron intake 05/05/2022   Chronic systolic heart failure (HCC) 05/05/2022   Hyponatremia 01/28/2022   Atypical chest pain 12/12/2018   Laboratory examination 12/12/2018   Hypothyroidism 05/29/2018   Paradoxical insomnia 05/29/2018   Non-restorative sleep 05/29/2018   Degenerative tear of medial meniscus, right 02/02/2017   Abnormality of gait 12/08/2016   Tendinopathy of left gluteus medius 12/08/2016   Leg length discrepancy 10/27/2016   Hallux limitus of left foot 10/27/2016   H/O radiofrequency ablation for complex left atrial arrhythmia 12/12/2013   HYPERLIPIDEMIA TYPE I / IV 12/30/2010   HYPERLIPIDEMIA-MIXED 12/30/2010    PCP: Royden Ronal Czar, FNP   REFERRING PROVIDER: Mollie Nestor HERO, PA-C   REFERRING DIAG: R15.9 (ICD-10-CM) - Incontinence of feces, unspecified fecal incontinence type  THERAPY DIAG:  Muscle weakness (generalized)  Unspecified lack of coordination  Abnormal posture  Rationale for Evaluation and Treatment: Rehabilitation  ONSET DATE: 15 years ago  SUBJECTIVE:                                                                                                                                                                                           SUBJECTIVE STATEMENT: Still using benefiber and type 4, and now 1x daily which is better. Fecal leakage is still happening around 1 every 2-3 days.  Urinary incontinence about the same but not her biggest concern.   Fluid intake: water - limited to no more than 64 oz due to heart restrictions; 2x cups decaf hot tea; 2-3 moca capachinos daily   PAIN:  Are you having pain? No   PRECAUTIONS: None  RED FLAGS: None   WEIGHT BEARING  RESTRICTIONS: No  FALLS:  Has patient fallen in last 6 months? No  OCCUPATION: retired  ACTIVITY LEVEL : moderate 2x weekly trainer workouts, walks dog daily   PLOF: Independent  PATIENT GOALS: to have no leakage  PERTINENT HISTORY:  Vulvodynia,Hemorrhoids,Diverticulosis,Anxiety Sexual abuse: No  BOWEL MOVEMENT: Pain with bowel movement: No Type of bowel movement:Type (Bristol Stool Scale) 5 (but very small soft pieces), Frequency was 3-4x daily eariler in the year but now ~2-3x, and Strain not now but historically yes Fully empty rectum: Yes:  needs several wipes to get clean Leakage: Yes: a very small pea size amount  Pads: No Fiber supplement/laxative Yes   URINATION: Pain with urination: No Fully empty bladder: Yes:   Stream: Strong Urgency: No Frequency: 2 hours but does just go as needed; 1x nightly Leakage: Sneezing and night time Pads: No  INTERCOURSE:  Ability to have vaginal penetration Yes  Pain with intercourse: Initial Penetration, During Penetration, and Deep Penetration DrynessYes  Climax: not active Marinoff Scale: 2/3  PREGNANCY: Vaginal deliveries 1 C-section deliveries 0 Currently pregnant No  PROLAPSE: None   OBJECTIVE:  Note: Objective measures were completed at Evaluation unless otherwise noted.  DIAGNOSTIC FINDINGS:    COGNITION: Overall cognitive status: Within functional limits for tasks assessed     SENSATION: Light touch: Appears intact  LUMBAR SPECIAL TESTS:  Single leg stance test: 4s on Lt 6s on Rt mild hip drop  FUNCTIONAL TESTS:  Functional squat - decreased decent by 50% and mild bil knee valgus  GAIT: WFL  POSTURE: rounded shoulders   LUMBARAROM/PROM:  A/PROM A/PROM  eval  Flexion WFL  Extension WFL  Right lateral flexion Limited by 25%  Left lateral flexion Limited by 25%  Right rotation Limited by 25%  Left rotation Limited by 25%   (  Blank rows = not tested)  LOWER EXTREMITY ROM:  Bil  hamstrings and adductors limited by 25%  LOWER EXTREMITY MMT:  Bil hips grossly 4/5 PALPATION:   General: no TTP  Pelvic Alignment: WFl  Abdominal: tension throughout and mild TTP LLQ                External Perineal Exam: skin dryness and mild redness around rectum                             Internal Pelvic Floor: TTP toward coccyx   Patient confirms identification and approves PT to assess internal pelvic floor and treatment Yes No emotional/communication barriers or cognitive limitation. Patient is motivated to learn. Patient understands and agrees with treatment goals and plan. PT explains patient will be examined in standing, sitting, and lying down to see how their muscles and joints work. When they are ready, they will be asked to remove their underwear so PT can examine their perineum. The patient is also given the option of providing their own chaperone as one is not provided in our facility. The patient also has the right and is explained the right to defer or refuse any part of the evaluation or treatment including the internal exam. With the patient's consent, PT will use one gloved finger to gently assess the muscles of the pelvic floor, seeing how well it contracts and relaxes and if there is muscle symmetry. After, the patient will get dressed and PT and patient will discuss exam findings and plan of care. PT and patient discuss plan of care, schedule, attendance policy and HEP activities.  PELVIC MMT:   MMT eval  Vaginal   Internal Anal Sphincter 2/5, 8s, 4 reps  External Anal Sphincter 2/5, 8s, 4 reps  Puborectalis 1/5  Diastasis Recti   (Blank rows = not tested)        TONE: Decreased   PROLAPSE: Not seen rectally   TODAY'S TREATMENT:                                                                                                                              DATE:   06/06/24 EVAL Examination completed, findings reviewed, pt educated on POC, HEP, and pelvic floor  anatomy pelvic floor PT  role in care, voiding mechanics. Pt had several questions about above education, all answered but did require extra time of session and pt denied any additional questions at end of session, verbalized understanding. Pt motivated to participate in PT and agreeable to attempt recommendations.    07/04/24: Reviewed HEP - pt completed x10 pelvic floor quick flicks, x10 10s holds, and x10 diaphragmatic breathing all in sitting Then had several questions about pelvic floor dynamics, coordination, and how to implement with personal trainer. All answered. And updated HEP for lunges, squats, and bridges to do with trainer as well.  Pt also educated on balloon breathing  and abdominal massage, PT completed in session and pt demonstrated good carry over.   07/22/24: Educated pt on importance of daily activity with walking program every other day or 2x weekly as she does see a trainer 2x weekly for strengthening. Pt reports she has been doing kegels but not strengthening exercises in HEP reports she will do these.  Also educated to reach out to referring provider if needed for recommendations on further medical interventions  Also educated pt she can reach out to gyn about possible options with medications/medical interventions with Vulvodynia and or tissue health at pelvic floor  Pt deferred internal reassessment today but extra time spent answering pt questions about patterns with fecal leakage, possible benefits of diet changes if needed. Pt agreed she can ask referring provider questions as needed about medications. Did complete several reps of squats body weight, for carry of coordination with pelvic floor and breathing with strengthening pt reports she does want to implement this with trainer.    PATIENT EDUCATION:  Education details: [id number] Person educated: Patient Education method: Explanation, Demonstration, Tactile cues, Verbal cues, and Handouts Education comprehension:  verbalized understanding, returned demonstration, verbal cues required, tactile cues required, and needs further education  HOME EXERCISE PROGRAM: [id number]  ASSESSMENT:  CLINICAL IMPRESSION: Patient is a 73 y.o. female  who was seen today for physical therapy treatment for fecal and urinary incontinence. Pt does report 50% improvement since starting PT but not resolved. Session focused on education with pt having several questions on HEP, patterns with fecal incontinence remaining. All answered and pt denied additional questions.  Pt would benefit from additional PT to further address deficits.    OBJECTIVE IMPAIRMENTS: decreased activity tolerance, decreased coordination, decreased endurance, decreased mobility, decreased strength, impaired flexibility, improper body mechanics, postural dysfunction, and pain.   ACTIVITY LIMITATIONS: continence  PARTICIPATION LIMITATIONS: community activity  PERSONAL FACTORS: Time since onset of injury/illness/exacerbation are also affecting patient's functional outcome.   REHAB POTENTIAL: Good  CLINICAL DECISION MAKING: Stable/uncomplicated  EVALUATION COMPLEXITY: Low   GOALS: Goals reviewed with patient? Yes  SHORT TERM GOALS: Target date: 07/04/24  Pt to be I with HEP for carry over and continuing recommendations for improved outcomes.   Baseline: Goal status: INITIAL  2.  Pt will be independent with use of squatty potty, relaxed toileting mechanics, and improved bowel movement techniques in order to increase ease of bowel movements and complete evacuation.   Baseline:  Goal status: INITIAL  3.  Pt will be independent with the knack, urge suppression technique, and double voiding in order to improve bladder habits and decrease urinary incontinence.   Baseline:  Goal status: INITIAL  LONG TERM GOALS: Target date: 09/06/24  Pt to be I with  advanced HEP for carry over and continuing recommendations for improved outcomes.   Baseline:   Goal status: INITIAL  2.  Pt will report her BMs are complete due to improved bowel habits and evacuation techniques at least 75% of the time for improved bowel health and more confidence with community outings.  Baseline:  Goal status: INITIAL  3.  Pt to demonstrate improved coordination of pelvic floor and breathing mechanics with 10# squat with appropriate synergistic patterns to decrease pain and leakage at least 75% of the time for improved ability to complete a 30 minute walk without strain at pelvic floor and symptoms.    Baseline:  Goal status: INITIAL  4.  Pt to report no more than 1 instance of fecal leakage in a month for  improved confidence with community outings.  Baseline:  Goal status: INITIAL  5.  Pt to be I with pressure management techniques to decreased strain at pelvic floor with lifting at least 15# for lifting her dog without pain or leakage. Baseline:  Goal status: INITIAL    PLAN:  PT FREQUENCY: 1x/week  PT DURATION: 8 sessions  PLANNED INTERVENTIONS: 97110-Therapeutic exercises, 97530- Therapeutic activity, 97112- Neuromuscular re-education, 97535- Self Care, [id number]- Manual therapy, [phone]- Canalith repositioning, [id number]- Aquatic Therapy, [phone]- Electrical stimulation (manual), [phone] (1-2 muscles), [phone] (3+ muscles)- Dry Needling, Patient/Family education, Taping, Joint mobilization, Spinal mobilization, Scar mobilization, DME instructions, Cryotherapy, Moist heat, and Biofeedback  PLAN FOR NEXT SESSION: coordination of pelvic floor and breathing, hip and core strengthening, lifting and voiding mechanics   Darryle Navy, PT, DPT 10/06/252:00 PM  [facility] [address] [place], [place] [address] Phone # [phone] Fax [phone]

## 2024-07-23 DIAGNOSIS — E039 Hypothyroidism, unspecified: Secondary | ICD-10-CM | POA: Diagnosis not present

## 2024-07-29 ENCOUNTER — Ambulatory Visit: Admitting: Physical Therapy

## 2024-07-29 DIAGNOSIS — M6281 Muscle weakness (generalized): Secondary | ICD-10-CM

## 2024-07-29 DIAGNOSIS — R293 Abnormal posture: Secondary | ICD-10-CM | POA: Diagnosis not present

## 2024-07-29 DIAGNOSIS — R279 Unspecified lack of coordination: Secondary | ICD-10-CM

## 2024-07-29 NOTE — Therapy (Signed)
 OUTPATIENT PHYSICAL THERAPY FEMALE PELVIC TREATMENT   Patient Name: [name] MRN: [id number] DOB:Nov 20, 1950, 73 y.o., female Today's Date: 07/29/2024  END OF SESSION:  PT End of Session - 07/29/24 1249     Visit Number 4    Date for Recertification  09/06/24    Authorization Type MEDICARE    Progress Note Due on Visit 10    PT Start Time 1107    PT Stop Time 1145    PT Time Calculation (min) 38 min    Activity Tolerance Patient tolerated treatment well    Behavior During Therapy [facility] for tasks assessed/performed            Past Medical History:  Diagnosis Date   Adenomatous polyp    Allergic rhinitis    Anxiety    AV Nodal Reentry Tachycardia    s/p RFCA GT 2/15   AVNRT (AV nodal re-entry tachycardia) 11/06/2013   Calcified granuloma of lung    Cataract    CHF (congestive heart failure) (HCC)    Diverticulosis 01/2020   Gastritis    GERD (gastroesophageal reflux disease)    H/O radiofrequency ablation for complex left atrial arrhythmia 12/12/2013   Headache    Hematuria    Hemorrhoids    HTN (hypertension)    Hyperlipemia    Hypothyroidism    Insomnia    Iron deficiency    Irregular heart beat    Laryngopharyngeal reflux (LPR)    Migraine    Mitral valve prolapse    ?   Rectal leakage    1 time   Retinal tear of both eyes    Skin lesion of breast    Vitamin D deficiency    Vulvodynia    Past Surgical History:  Procedure Laterality Date   ABLATION  11/21/2013   RFCA of AVNRT by Dr Waddell   BREAST LUMPECTOMY Left 2000   left breast-   CATARCT     RIGHT EYE IN 09/2015   COLONOSCOPY  2007, 2008   RETINAL DETACHMENT SURGERY     RIGHT EYE/SILICONE BUCKLE   RHINOPLASTY  1980   RIGHT HEART CATH N/A 04/07/2022   Procedure: RIGHT HEART CATH;  Surgeon: Wendel Lurena POUR, MD;  Location: [facility] INVASIVE [facility];  Service: Cardiovascular;  Laterality: N/A;   SEPTOPLASTY     1980   skin lesions     left leg,right leg   SUPRAVENTRICULAR TACHYCARDIA  ABLATION N/A 11/21/2013   Procedure: SUPRAVENTRICULAR TACHYCARDIA ABLATION;  Surgeon: Danelle LELON Waddell, MD;  Location: [facility] CATH LAB;  Service: Cardiovascular;  Laterality: N/A;   Patient Active Problem List   Diagnosis Date Noted   Iron deficiency anemia secondary to inadequate dietary iron intake 05/05/2022   Chronic systolic heart failure (HCC) 05/05/2022   Hyponatremia 01/28/2022   Atypical chest pain 12/12/2018   Laboratory examination 12/12/2018   Hypothyroidism 05/29/2018   Paradoxical insomnia 05/29/2018   Non-restorative sleep 05/29/2018   Degenerative tear of medial meniscus, right 02/02/2017   Abnormality of gait 12/08/2016   Tendinopathy of left gluteus medius 12/08/2016   Leg length discrepancy 10/27/2016   Hallux limitus of left foot 10/27/2016   H/O radiofrequency ablation for complex left atrial arrhythmia 12/12/2013   HYPERLIPIDEMIA TYPE I / IV 12/30/2010   HYPERLIPIDEMIA-MIXED 12/30/2010    PCP: Royden Ronal Czar, FNP   REFERRING PROVIDER: Mollie Nestor HERO, PA-C   REFERRING DIAG: R15.9 (ICD-10-CM) - Incontinence of feces, unspecified fecal incontinence type  THERAPY DIAG:  Muscle weakness (generalized)  Unspecified lack of coordination  Abnormal posture  Rationale for Evaluation and Treatment: Rehabilitation  ONSET DATE: 15 years ago  SUBJECTIVE:                                                                                                                                                                                           SUBJECTIVE STATEMENT: Had a large fecal incontinence episode after eating ice cream (usually does lactose free milk)  Fluid intake: water - limited to no more than 64 oz due to heart restrictions; 2x cups decaf hot tea; 2-3 moca capachinos daily   PAIN:  Are you having pain? No   PRECAUTIONS: None  RED FLAGS: None   WEIGHT BEARING RESTRICTIONS: No  FALLS:  Has patient fallen in last 6 months? No  OCCUPATION:  retired  ACTIVITY LEVEL : moderate 2x weekly trainer workouts, walks dog daily   PLOF: Independent  PATIENT GOALS: to have no leakage  PERTINENT HISTORY:  Vulvodynia,Hemorrhoids,Diverticulosis,Anxiety Sexual abuse: No  BOWEL MOVEMENT: Pain with bowel movement: No Type of bowel movement:Type (Bristol Stool Scale) 5 (but very small soft pieces), Frequency was 3-4x daily eariler in the year but now ~2-3x, and Strain not now but historically yes Fully empty rectum: Yes:  needs several wipes to get clean Leakage: Yes: a very small pea size amount  Pads: No Fiber supplement/laxative Yes   URINATION: Pain with urination: No Fully empty bladder: Yes:   Stream: Strong Urgency: No Frequency: 2 hours but does just go as needed; 1x nightly Leakage: Sneezing and night time Pads: No  INTERCOURSE:  Ability to have vaginal penetration Yes  Pain with intercourse: Initial Penetration, During Penetration, and Deep Penetration DrynessYes  Climax: not active Marinoff Scale: 2/3  PREGNANCY: Vaginal deliveries 1 C-section deliveries 0 Currently pregnant No  PROLAPSE: None   OBJECTIVE:  Note: Objective measures were completed at Evaluation unless otherwise noted.  DIAGNOSTIC FINDINGS:    COGNITION: Overall cognitive status: Within functional limits for tasks assessed     SENSATION: Light touch: Appears intact  LUMBAR SPECIAL TESTS:  Single leg stance test: 4s on Lt 6s on Rt mild hip drop  FUNCTIONAL TESTS:  Functional squat - decreased decent by 50% and mild bil knee valgus  GAIT: WFL  POSTURE: rounded shoulders   LUMBARAROM/PROM:  A/PROM A/PROM  eval  Flexion WFL  Extension WFL  Right lateral flexion Limited by 25%  Left lateral flexion Limited by 25%  Right rotation Limited by 25%  Left rotation Limited by 25%   (Blank rows = not tested)  LOWER EXTREMITY ROM:  Bil hamstrings and adductors limited by 25%  LOWER EXTREMITY MMT:  Bil hips grossly  4/5 PALPATION:   General: no TTP  Pelvic Alignment: WFl  Abdominal: tension throughout and mild TTP LLQ                External Perineal Exam: skin dryness and mild redness around rectum                             Internal Pelvic Floor: TTP toward coccyx   Patient confirms identification and approves PT to assess internal pelvic floor and treatment Yes No emotional/communication barriers or cognitive limitation. Patient is motivated to learn. Patient understands and agrees with treatment goals and plan. PT explains patient will be examined in standing, sitting, and lying down to see how their muscles and joints work. When they are ready, they will be asked to remove their underwear so PT can examine their perineum. The patient is also given the option of providing their own chaperone as one is not provided in our facility. The patient also has the right and is explained the right to defer or refuse any part of the evaluation or treatment including the internal exam. With the patient's consent, PT will use one gloved finger to gently assess the muscles of the pelvic floor, seeing how well it contracts and relaxes and if there is muscle symmetry. After, the patient will get dressed and PT and patient will discuss exam findings and plan of care. PT and patient discuss plan of care, schedule, attendance policy and HEP activities.  PELVIC MMT:   MMT eval  Vaginal   Internal Anal Sphincter 2/5, 8s, 4 reps  External Anal Sphincter 2/5, 8s, 4 reps  Puborectalis 1/5  Diastasis Recti   (Blank rows = not tested)        TONE: Decreased   PROLAPSE: Not seen rectally   TODAY'S TREATMENT:                                                                                                                              DATE:   06/06/24 EVAL Examination completed, findings reviewed, pt educated on POC, HEP, and pelvic floor anatomy pelvic floor PT  role in care, voiding mechanics. Pt had several questions  about above education, all answered but did require extra time of session and pt denied any additional questions at end of session, verbalized understanding. Pt motivated to participate in PT and agreeable to attempt recommendations.    07/04/24: Reviewed HEP - pt completed x10 pelvic floor quick flicks, x10 10s holds, and x10 diaphragmatic breathing all in sitting Then had several questions about pelvic floor dynamics, coordination, and how to implement with personal trainer. All answered. And updated HEP for lunges, squats, and bridges to do with trainer as well.  Pt also educated on balloon breathing and abdominal massage, PT completed in session and pt demonstrated good carry over.   07/22/24: Educated pt  on importance of daily activity with walking program every other day or 2x weekly as she does see a trainer 2x weekly for strengthening. Pt reports she has been doing kegels but not strengthening exercises in HEP reports she will do these.  Also educated to reach out to referring provider if needed for recommendations on further medical interventions  Also educated pt she can reach out to gyn about possible options with medications/medical interventions with Vulvodynia and or tissue health at pelvic floor  Pt deferred internal reassessment today but extra time spent answering pt questions about patterns with fecal leakage, possible benefits of diet changes if needed. Pt agreed she can ask referring provider questions as needed about medications. Did complete several reps of squats body weight, for carry of coordination with pelvic floor and breathing with strengthening pt reports she does want to implement this with trainer.    07/29/24 Hooklying bridge + clam black loop 2x10 with pelvic floor and exhale Double clams black loop x10 each side Standing mario punches x5 each body weight Pt reminded of pelvic floor coordination with breathing with exercises for her HEP and with trainer as able.   Also discussed self monitoring diet triggers for bowel symptoms and discussing with doctor or nutritionist.   PATIENT EDUCATION:  Education details: [id number] Person educated: Patient Education method: Explanation, Demonstration, Tactile cues, Verbal cues, and Handouts Education comprehension: verbalized understanding, returned demonstration, verbal cues required, tactile cues required, and needs further education  HOME EXERCISE PROGRAM: [id number]  ASSESSMENT:  CLINICAL IMPRESSION: Patient is a 73 y.o. female  who was seen today for physical therapy treatment for fecal and urinary incontinence. Session focused on further strengthening hips/core with pelvic floor and coordinating pelvic floor with breathing for improved pelvic floor strength. Pt also has several medication related questions to see if these remaining symptoms could be diet or medication related. PT educated pt to ask medical provider for this and pt agreed. Pt does continue to have fecal incontinence though less frequently. Did have one instance of large loose bowel movement after ice cream this weekend. Pt would benefit from additional PT to further address deficits.    OBJECTIVE IMPAIRMENTS: decreased activity tolerance, decreased coordination, decreased endurance, decreased mobility, decreased strength, impaired flexibility, improper body mechanics, postural dysfunction, and pain.   ACTIVITY LIMITATIONS: continence  PARTICIPATION LIMITATIONS: community activity  PERSONAL FACTORS: Time since onset of injury/illness/exacerbation are also affecting patient's functional outcome.   REHAB POTENTIAL: Good  CLINICAL DECISION MAKING: Stable/uncomplicated  EVALUATION COMPLEXITY: Low   GOALS: Goals reviewed with patient? Yes  SHORT TERM GOALS: Target date: 07/04/24  Pt to be I with HEP for carry over and continuing recommendations for improved outcomes.   Baseline: Goal status: MET  2.  Pt will be independent with use of  squatty potty, relaxed toileting mechanics, and improved bowel movement techniques in order to increase ease of bowel movements and complete evacuation.   Baseline:  Goal status: MET  3.  Pt will be independent with the knack, urge suppression technique, and double voiding in order to improve bladder habits and decrease urinary incontinence.   Baseline:  Goal status: on going - pt requests to focus on fecal leakage   LONG TERM GOALS: Target date: 09/06/24  Pt to be I with  advanced HEP for carry over and continuing recommendations for improved outcomes.   Baseline:  Goal status: INITIAL  2.  Pt will report her BMs are complete due to improved bowel habits and evacuation techniques at  least 75% of the time for improved bowel health and more confidence with community outings.  Baseline:  Goal status: INITIAL  3.  Pt to demonstrate improved coordination of pelvic floor and breathing mechanics with 10# squat with appropriate synergistic patterns to decrease pain and leakage at least 75% of the time for improved ability to complete a 30 minute walk without strain at pelvic floor and symptoms.    Baseline:  Goal status: INITIAL  4.  Pt to report no more than 1 instance of fecal leakage in a month for improved confidence with community outings.  Baseline:  Goal status: INITIAL  5.  Pt to be I with pressure management techniques to decreased strain at pelvic floor with lifting at least 15# for lifting her dog without pain or leakage. Baseline:  Goal status: INITIAL    PLAN:  PT FREQUENCY: 1x/week  PT DURATION: 8 sessions  PLANNED INTERVENTIONS: 97110-Therapeutic exercises, 97530- Therapeutic activity, 97112- Neuromuscular re-education, 97535- Self Care, [id number]- Manual therapy, [phone]- Canalith repositioning, [id number]- Aquatic Therapy, [phone]- Electrical stimulation (manual), [phone] (1-2 muscles), [id number] (3+ muscles)- Dry Needling, Patient/Family education, Taping, Joint mobilization, Spinal  mobilization, Scar mobilization, DME instructions, Cryotherapy, Moist heat, and Biofeedback  PLAN FOR NEXT SESSION: coordination of pelvic floor and breathing, hip and core strengthening, lifting and voiding mechanics   Darryle Navy, PT, DPT 07/30/2511:55 PM  [facility] [address], [address] [place], [place] [address] Phone # [phone] Fax [phone]

## 2024-08-08 ENCOUNTER — Ambulatory Visit: Admitting: Physical Therapy

## 2024-08-08 DIAGNOSIS — R279 Unspecified lack of coordination: Secondary | ICD-10-CM

## 2024-08-08 DIAGNOSIS — R293 Abnormal posture: Secondary | ICD-10-CM

## 2024-08-08 DIAGNOSIS — M6281 Muscle weakness (generalized): Secondary | ICD-10-CM | POA: Diagnosis not present

## 2024-08-08 NOTE — Therapy (Signed)
 OUTPATIENT PHYSICAL THERAPY FEMALE PELVIC TREATMENT   Patient Name: [name] MRN: [id number] DOB:[birth year], 73 y.o., female Today's Date: 08/08/2024  END OF SESSION:  PT End of Session - 08/08/24 1104     Visit Number 5    Date for Recertification  09/06/24    Authorization Type MEDICARE    Progress Note Due on Visit 10    PT Start Time 1101    PT Stop Time 1142    PT Time Calculation (min) 41 min    Activity Tolerance Patient tolerated treatment well    Behavior During Therapy [facility] for tasks assessed/performed            Past Medical History:  Diagnosis Date   Adenomatous polyp    Allergic rhinitis    Anxiety    AV Nodal Reentry Tachycardia    s/p RFCA GT 2/15   AVNRT (AV nodal re-entry tachycardia) 11/06/2013   Calcified granuloma of lung    Cataract    CHF (congestive heart failure) (HCC)    Diverticulosis 01/2020   Gastritis    GERD (gastroesophageal reflux disease)    H/O radiofrequency ablation for complex left atrial arrhythmia 12/12/2013   Headache    Hematuria    Hemorrhoids    HTN (hypertension)    Hyperlipemia    Hypothyroidism    Insomnia    Iron deficiency    Irregular heart beat    Laryngopharyngeal reflux (LPR)    Migraine    Mitral valve prolapse    ?   Rectal leakage    1 time   Retinal tear of both eyes    Skin lesion of breast    Vitamin D deficiency    Vulvodynia    Past Surgical History:  Procedure Laterality Date   ABLATION  11/21/2013   RFCA of AVNRT by Dr Waddell   BREAST LUMPECTOMY Left 2000   left breast-   CATARCT     RIGHT EYE IN 09/2015   COLONOSCOPY  2007, 2008   RETINAL DETACHMENT SURGERY     RIGHT EYE/SILICONE BUCKLE   RHINOPLASTY  1980   RIGHT HEART CATH N/A 04/07/2022   Procedure: RIGHT HEART CATH;  Surgeon: Wendel Lurena POUR, MD;  Location: [facility] INVASIVE CV LAB;  Service: Cardiovascular;  Laterality: N/A;   SEPTOPLASTY     1980   skin lesions     left leg,right leg   SUPRAVENTRICULAR TACHYCARDIA  ABLATION N/A 11/21/2013   Procedure: SUPRAVENTRICULAR TACHYCARDIA ABLATION;  Surgeon: Danelle LELON Waddell, MD;  Location: [facility] CATH LAB;  Service: Cardiovascular;  Laterality: N/A;   Patient Active Problem List   Diagnosis Date Noted   Iron deficiency anemia secondary to inadequate dietary iron intake 05/05/2022   Chronic systolic heart failure (HCC) 05/05/2022   Hyponatremia 01/28/2022   Atypical chest pain 12/12/2018   Laboratory examination 12/12/2018   Hypothyroidism 05/29/2018   Paradoxical insomnia 05/29/2018   Non-restorative sleep 05/29/2018   Degenerative tear of medial meniscus, right 02/02/2017   Abnormality of gait 12/08/2016   Tendinopathy of left gluteus medius 12/08/2016   Leg length discrepancy 10/27/2016   Hallux limitus of left foot 10/27/2016   H/O radiofrequency ablation for complex left atrial arrhythmia 12/12/2013   HYPERLIPIDEMIA TYPE I / IV 12/30/2010   HYPERLIPIDEMIA-MIXED 12/30/2010    PCP: Royden Ronal Czar, FNP   REFERRING PROVIDER: Mollie Nestor HERO, PA-C   REFERRING DIAG: R15.9 (ICD-10-CM) - Incontinence of feces, unspecified fecal incontinence type  THERAPY DIAG:  Muscle weakness (generalized)  Unspecified lack of coordination  Abnormal posture  Rationale for Evaluation and Treatment: Rehabilitation  ONSET DATE: 15 years ago  SUBJECTIVE:                                                                                                                                                                                           SUBJECTIVE STATEMENT: Had a much better week with fecal leakage - has had less diary (was drinking 8oz milk and cup of coffee cut this out) and doing HEP more regularly. No fecal leakage this week.   Fluid intake: water - limited to no more than 64 oz due to heart restrictions; 2x cups decaf hot tea; 2-3 moca capachinos daily   PAIN:  Are you having pain? No   PRECAUTIONS: None  RED FLAGS: None   WEIGHT BEARING  RESTRICTIONS: No  FALLS:  Has patient fallen in last 6 months? No  OCCUPATION: retired  ACTIVITY LEVEL : moderate 2x weekly trainer workouts, walks dog daily   PLOF: Independent  PATIENT GOALS: to have no leakage  PERTINENT HISTORY:  Vulvodynia,Hemorrhoids,Diverticulosis,Anxiety Sexual abuse: No  BOWEL MOVEMENT: Pain with bowel movement: No Type of bowel movement:Type (Bristol Stool Scale) 5 (but very small soft pieces), Frequency was 3-4x daily eariler in the year but now ~2-3x, and Strain not now but historically yes Fully empty rectum: Yes:  needs several wipes to get clean Leakage: Yes: a very small pea size amount  Pads: No Fiber supplement/laxative Yes   URINATION: Pain with urination: No Fully empty bladder: Yes:   Stream: Strong Urgency: No Frequency: 2 hours but does just go as needed; 1x nightly Leakage: Sneezing and night time Pads: No  INTERCOURSE:  Ability to have vaginal penetration Yes  Pain with intercourse: Initial Penetration, During Penetration, and Deep Penetration DrynessYes  Climax: not active Marinoff Scale: 2/3  PREGNANCY: Vaginal deliveries 1 C-section deliveries 0 Currently pregnant No  PROLAPSE: None   OBJECTIVE:  Note: Objective measures were completed at Evaluation unless otherwise noted.  DIAGNOSTIC FINDINGS:    COGNITION: Overall cognitive status: Within functional limits for tasks assessed     SENSATION: Light touch: Appears intact  LUMBAR SPECIAL TESTS:  Single leg stance test: 4s on Lt 6s on Rt mild hip drop  FUNCTIONAL TESTS:  Functional squat - decreased decent by 50% and mild bil knee valgus  GAIT: WFL  POSTURE: rounded shoulders   LUMBARAROM/PROM:  A/PROM A/PROM  eval  Flexion WFL  Extension WFL  Right lateral flexion Limited by 25%  Left lateral flexion Limited by 25%  Right rotation Limited by 25%  Left rotation Limited by 25%   (  Blank rows = not tested)  LOWER EXTREMITY ROM:  Bil  hamstrings and adductors limited by 25%  LOWER EXTREMITY MMT:  Bil hips grossly 4/5 PALPATION:   General: no TTP  Pelvic Alignment: WFl  Abdominal: tension throughout and mild TTP LLQ                External Perineal Exam: skin dryness and mild redness around rectum                             Internal Pelvic Floor: TTP toward coccyx   Patient confirms identification and approves PT to assess internal pelvic floor and treatment Yes No emotional/communication barriers or cognitive limitation. Patient is motivated to learn. Patient understands and agrees with treatment goals and plan. PT explains patient will be examined in standing, sitting, and lying down to see how their muscles and joints work. When they are ready, they will be asked to remove their underwear so PT can examine their perineum. The patient is also given the option of providing their own chaperone as one is not provided in our facility. The patient also has the right and is explained the right to defer or refuse any part of the evaluation or treatment including the internal exam. With the patient's consent, PT will use one gloved finger to gently assess the muscles of the pelvic floor, seeing how well it contracts and relaxes and if there is muscle symmetry. After, the patient will get dressed and PT and patient will discuss exam findings and plan of care. PT and patient discuss plan of care, schedule, attendance policy and HEP activities.  PELVIC MMT:   MMT eval  Vaginal   Internal Anal Sphincter 2/5, 8s, 4 reps  External Anal Sphincter 2/5, 8s, 4 reps  Puborectalis 1/5  Diastasis Recti   (Blank rows = not tested)        TONE: Decreased   PROLAPSE: Not seen rectally   TODAY'S TREATMENT:                                                                                                                              DATE:    07/22/24: Educated pt on importance of daily activity with walking program every other day or  2x weekly as she does see a trainer 2x weekly for strengthening. Pt reports she has been doing kegels but not strengthening exercises in HEP reports she will do these.  Also educated to reach out to referring provider if needed for recommendations on further medical interventions  Also educated pt she can reach out to gyn about possible options with medications/medical interventions with Vulvodynia and or tissue health at pelvic floor  Pt deferred internal reassessment today but extra time spent answering pt questions about patterns with fecal leakage, possible benefits of diet changes if needed. Pt agreed she can ask referring provider questions as needed about medications. Did  complete several reps of squats body weight, for carry of coordination with pelvic floor and breathing with strengthening pt reports she does want to implement this with trainer.    07/29/24 Hooklying bridge + clam black loop 2x10 with pelvic floor and exhale Double clams black loop x10 each side Standing mario punches x5 each body weight Pt reminded of pelvic floor coordination with breathing with exercises for her HEP and with trainer as able.  Also discussed self monitoring diet triggers for bowel symptoms and discussing with doctor or nutritionist.   08/08/24: All exercises cued for exhale and pelvic floor activations with activity  Sit to stands 10# total x10 with pelvic floor contractions and exhale Standing alt marching with OHP 5# 2x10 Standing lateral step out 5# front press 2x10 each Standing palloff blue band 2x10 each Standing rotational palloffs blue band 2x10 each Pt educated on pelvic mobility and balloon breathing as needed while sitting on toilet to assist in fully evacuating bowels   PATIENT EDUCATION:  Education details: [id number] Person educated: Patient Education method: Programmer, multimedia, Demonstration, Tactile cues, Verbal cues, and Handouts Education comprehension: verbalized understanding, returned  demonstration, verbal cues required, tactile cues required, and needs further education  HOME EXERCISE PROGRAM: [id number]  ASSESSMENT:  CLINICAL IMPRESSION: Patient is a 73 y.o. female  who was seen today for physical therapy treatment for fecal and urinary incontinence. Session focused on further strengthening hips/core with pelvic floor and coordinating pelvic floor with breathing for improved pelvic floor strength. Progressed with weighted exercises and more dynamic standing exercises. Pt has had no bowel leakage this week and has implemented recommendations which helped. Pt would benefit from additional PT to further address deficits.    OBJECTIVE IMPAIRMENTS: decreased activity tolerance, decreased coordination, decreased endurance, decreased mobility, decreased strength, impaired flexibility, improper body mechanics, postural dysfunction, and pain.   ACTIVITY LIMITATIONS: continence  PARTICIPATION LIMITATIONS: community activity  PERSONAL FACTORS: Time since onset of injury/illness/exacerbation are also affecting patient's functional outcome.   REHAB POTENTIAL: Good  CLINICAL DECISION MAKING: Stable/uncomplicated  EVALUATION COMPLEXITY: Low   GOALS: Goals reviewed with patient? Yes  SHORT TERM GOALS: Target date: 07/04/24  Pt to be I with HEP for carry over and continuing recommendations for improved outcomes.   Baseline: Goal status: MET  2.  Pt will be independent with use of squatty potty, relaxed toileting mechanics, and improved bowel movement techniques in order to increase ease of bowel movements and complete evacuation.   Baseline:  Goal status: MET  3.  Pt will be independent with the knack, urge suppression technique, and double voiding in order to improve bladder habits and decrease urinary incontinence.   Baseline:  Goal status: on going - pt requests to focus on fecal leakage   LONG TERM GOALS: Target date: 09/06/24  Pt to be I with  advanced HEP for carry  over and continuing recommendations for improved outcomes.   Baseline:  Goal status: INITIAL  2.  Pt will report her BMs are complete due to improved bowel habits and evacuation techniques at least 75% of the time for improved bowel health and more confidence with community outings.  Baseline:  Goal status: INITIAL  3.  Pt to demonstrate improved coordination of pelvic floor and breathing mechanics with 10# squat with appropriate synergistic patterns to decrease pain and leakage at least 75% of the time for improved ability to complete a 30 minute walk without strain at pelvic floor and symptoms.    Baseline:  Goal status: INITIAL  4.  Pt to report no more than 1 instance of fecal leakage in a month for improved confidence with community outings.  Baseline:  Goal status: INITIAL  5.  Pt to be I with pressure management techniques to decreased strain at pelvic floor with lifting at least 15# for lifting her dog without pain or leakage. Baseline:  Goal status: INITIAL    PLAN:  PT FREQUENCY: 1x/week  PT DURATION: 8 sessions  PLANNED INTERVENTIONS: 97110-Therapeutic exercises, 97530- Therapeutic activity, 97112- Neuromuscular re-education, 97535- Self Care, [id number]- Manual therapy, [phone]- Canalith repositioning, [id number]- Aquatic Therapy, [phone]- Electrical stimulation (manual), [phone] (1-2 muscles), [id number] (3+ muscles)- Dry Needling, Patient/Family education, Taping, Joint mobilization, Spinal mobilization, Scar mobilization, DME instructions, Cryotherapy, Moist heat, and Biofeedback  PLAN FOR NEXT SESSION: coordination of pelvic floor and breathing, hip and core strengthening, lifting and voiding mechanics   Darryle Navy, PT, DPT 08/08/2510:42 AM  [facility] [address], [address] [place], [place] [address] Phone # [phone] Fax [phone]

## 2024-08-15 ENCOUNTER — Encounter: Admitting: Physical Therapy

## 2024-08-21 ENCOUNTER — Encounter: Admitting: Physical Therapy

## 2024-08-22 DIAGNOSIS — H18452 Nodular corneal degeneration, left eye: Secondary | ICD-10-CM | POA: Diagnosis not present

## 2024-08-29 ENCOUNTER — Encounter: Admitting: Physical Therapy

## 2024-09-02 DIAGNOSIS — D2239 Melanocytic nevi of other parts of face: Secondary | ICD-10-CM | POA: Diagnosis not present

## 2024-09-02 DIAGNOSIS — D2262 Melanocytic nevi of left upper limb, including shoulder: Secondary | ICD-10-CM | POA: Diagnosis not present

## 2024-09-02 DIAGNOSIS — L821 Other seborrheic keratosis: Secondary | ICD-10-CM | POA: Diagnosis not present

## 2024-09-02 DIAGNOSIS — B353 Tinea pedis: Secondary | ICD-10-CM | POA: Diagnosis not present

## 2024-09-02 DIAGNOSIS — B351 Tinea unguium: Secondary | ICD-10-CM | POA: Diagnosis not present

## 2024-09-02 DIAGNOSIS — D2261 Melanocytic nevi of right upper limb, including shoulder: Secondary | ICD-10-CM | POA: Diagnosis not present

## 2024-09-02 DIAGNOSIS — L304 Erythema intertrigo: Secondary | ICD-10-CM | POA: Diagnosis not present

## 2024-09-02 DIAGNOSIS — D225 Melanocytic nevi of trunk: Secondary | ICD-10-CM | POA: Diagnosis not present

## 2024-09-02 DIAGNOSIS — D2271 Melanocytic nevi of right lower limb, including hip: Secondary | ICD-10-CM | POA: Diagnosis not present

## 2024-09-02 DIAGNOSIS — D1801 Hemangioma of skin and subcutaneous tissue: Secondary | ICD-10-CM | POA: Diagnosis not present

## 2024-09-02 DIAGNOSIS — D2272 Melanocytic nevi of left lower limb, including hip: Secondary | ICD-10-CM | POA: Diagnosis not present

## 2024-09-05 ENCOUNTER — Ambulatory Visit: Attending: Gastroenterology | Admitting: Physical Therapy

## 2024-09-05 DIAGNOSIS — M6281 Muscle weakness (generalized): Secondary | ICD-10-CM | POA: Insufficient documentation

## 2024-09-05 DIAGNOSIS — R279 Unspecified lack of coordination: Secondary | ICD-10-CM | POA: Insufficient documentation

## 2024-09-05 NOTE — Therapy (Signed)
 OUTPATIENT PHYSICAL THERAPY FEMALE PELVIC TREATMENT   Patient Name: [name] MRN: [id number] DOB:1951-07-04, 73 y.o., female Today's Date: 09/05/2024  END OF SESSION:  PT End of Session - 09/05/24 1109     Visit Number 6    Date for Recertification  09/06/24    Authorization Type MEDICARE    Progress Note Due on Visit 10    PT Start Time 1105    PT Stop Time 1145    PT Time Calculation (min) 40 min    Activity Tolerance Patient tolerated treatment well    Behavior During Therapy [facility] for tasks assessed/performed             Past Medical History:  Diagnosis Date   Adenomatous polyp    Allergic rhinitis    Anxiety    AV Nodal Reentry Tachycardia    s/p RFCA GT 2/15   AVNRT (AV nodal re-entry tachycardia) 11/06/2013   Calcified granuloma of lung    Cataract    CHF (congestive heart failure) (HCC)    Diverticulosis 01/2020   Gastritis    GERD (gastroesophageal reflux disease)    H/O radiofrequency ablation for complex left atrial arrhythmia 12/12/2013   Headache    Hematuria    Hemorrhoids    HTN (hypertension)    Hyperlipemia    Hypothyroidism    Insomnia    Iron deficiency    Irregular heart beat    Laryngopharyngeal reflux (LPR)    Migraine    Mitral valve prolapse    ?   Rectal leakage    1 time   Retinal tear of both eyes    Skin lesion of breast    Vitamin D deficiency    Vulvodynia    Past Surgical History:  Procedure Laterality Date   ABLATION  11/21/2013   RFCA of AVNRT by Dr Waddell   BREAST LUMPECTOMY Left 2000   left breast-   CATARCT     RIGHT EYE IN 09/2015   COLONOSCOPY  2007, 2008   RETINAL DETACHMENT SURGERY     RIGHT EYE/SILICONE BUCKLE   RHINOPLASTY  1980   RIGHT HEART CATH N/A 04/07/2022   Procedure: RIGHT HEART CATH;  Surgeon: Wendel Lurena POUR, MD;  Location: [facility] INVASIVE CV LAB;  Service: Cardiovascular;  Laterality: N/A;   SEPTOPLASTY     1980   skin lesions     left leg,right leg   SUPRAVENTRICULAR TACHYCARDIA  ABLATION N/A 11/21/2013   Procedure: SUPRAVENTRICULAR TACHYCARDIA ABLATION;  Surgeon: Danelle LELON Waddell, MD;  Location: [facility] CATH LAB;  Service: Cardiovascular;  Laterality: N/A;   Patient Active Problem List   Diagnosis Date Noted   Iron deficiency anemia secondary to inadequate dietary iron intake 05/05/2022   Chronic systolic heart failure (HCC) 05/05/2022   Hyponatremia 01/28/2022   Atypical chest pain 12/12/2018   Laboratory examination 12/12/2018   Hypothyroidism 05/29/2018   Paradoxical insomnia 05/29/2018   Non-restorative sleep 05/29/2018   Degenerative tear of medial meniscus, right 02/02/2017   Abnormality of gait 12/08/2016   Tendinopathy of left gluteus medius 12/08/2016   Leg length discrepancy 10/27/2016   Hallux limitus of left foot 10/27/2016   H/O radiofrequency ablation for complex left atrial arrhythmia 12/12/2013   HYPERLIPIDEMIA TYPE I / IV 12/30/2010   HYPERLIPIDEMIA-MIXED 12/30/2010    PCP: Royden Ronal Czar, FNP   REFERRING PROVIDER: Mollie Nestor HERO, PA-C   REFERRING DIAG: R15.9 (ICD-10-CM) - Incontinence of feces, unspecified fecal incontinence type  THERAPY DIAG:  Muscle weakness (  generalized)  Unspecified lack of coordination  Rationale for Evaluation and Treatment: Rehabilitation  ONSET DATE: 15 years ago  SUBJECTIVE:                                                                                                                                                                                           SUBJECTIVE STATEMENT: Lowering fattening foods and ice cream and has seen improvement with this. With this and squatty potty and awareness when a small piece of stool present then can go and empty this and has no leakage. Before pt had no sensation and then had accident.   Fluid intake: water - limited to no more than 64 oz due to heart restrictions; 2x cups decaf hot tea; 2-3 moca capachinos daily   PAIN:  Are you having pain?  No   PRECAUTIONS: None  RED FLAGS: None   WEIGHT BEARING RESTRICTIONS: No  FALLS:  Has patient fallen in last 6 months? No  OCCUPATION: retired  ACTIVITY LEVEL : moderate 2x weekly trainer workouts, walks dog daily   PLOF: Independent  PATIENT GOALS: to have no leakage  PERTINENT HISTORY:  Vulvodynia,Hemorrhoids,Diverticulosis,Anxiety Sexual abuse: No  BOWEL MOVEMENT: Pain with bowel movement: No Type of bowel movement:Type (Bristol Stool Scale) 5 (but very small soft pieces), Frequency was 3-4x daily eariler in the year but now ~2-3x, and Strain not now but historically yes Fully empty rectum: Yes:  needs several wipes to get clean Leakage: Yes: a very small pea size amount  Pads: No Fiber supplement/laxative Yes   URINATION: Pain with urination: No Fully empty bladder: Yes:   Stream: Strong Urgency: No Frequency: 2 hours but does just go as needed; 1x nightly Leakage: Sneezing and night time Pads: No  INTERCOURSE:  Ability to have vaginal penetration Yes  Pain with intercourse: Initial Penetration, During Penetration, and Deep Penetration DrynessYes  Climax: not active Marinoff Scale: 2/3  PREGNANCY: Vaginal deliveries 1 C-section deliveries 0 Currently pregnant No  PROLAPSE: None   OBJECTIVE:  Note: Objective measures were completed at Evaluation unless otherwise noted.  DIAGNOSTIC FINDINGS:    COGNITION: Overall cognitive status: Within functional limits for tasks assessed     SENSATION: Light touch: Appears intact  LUMBAR SPECIAL TESTS:  Single leg stance test: 4s on Lt 6s on Rt mild hip drop  FUNCTIONAL TESTS:  Functional squat - decreased decent by 50% and mild bil knee valgus  GAIT: WFL  POSTURE: rounded shoulders   LUMBARAROM/PROM:  A/PROM A/PROM  eval  Flexion WFL  Extension WFL  Right lateral flexion Limited by 25%  Left lateral flexion Limited by 25%  Right  rotation Limited by 25%  Left rotation Limited by 25%    (Blank rows = not tested)  LOWER EXTREMITY ROM:  Bil hamstrings and adductors limited by 25%  LOWER EXTREMITY MMT:  Bil hips grossly 4/5 PALPATION:   General: no TTP  Pelvic Alignment: WFl  Abdominal: tension throughout and mild TTP LLQ                External Perineal Exam: skin dryness and mild redness around rectum                             Internal Pelvic Floor: TTP toward coccyx   Patient confirms identification and approves PT to assess internal pelvic floor and treatment Yes No emotional/communication barriers or cognitive limitation. Patient is motivated to learn. Patient understands and agrees with treatment goals and plan. PT explains patient will be examined in standing, sitting, and lying down to see how their muscles and joints work. When they are ready, they will be asked to remove their underwear so PT can examine their perineum. The patient is also given the option of providing their own chaperone as one is not provided in our facility. The patient also has the right and is explained the right to defer or refuse any part of the evaluation or treatment including the internal exam. With the patient's consent, PT will use one gloved finger to gently assess the muscles of the pelvic floor, seeing how well it contracts and relaxes and if there is muscle symmetry. After, the patient will get dressed and PT and patient will discuss exam findings and plan of care. PT and patient discuss plan of care, schedule, attendance policy and HEP activities.  PELVIC MMT:   MMT eval  Vaginal   Internal Anal Sphincter 2/5, 8s, 4 reps  External Anal Sphincter 2/5, 8s, 4 reps  Puborectalis 1/5  Diastasis Recti   (Blank rows = not tested)        TONE: Decreased   PROLAPSE: Not seen rectally   TODAY'S TREATMENT:                                                                                                                              DATE:   07/29/24 Hooklying bridge + clam black  loop 2x10 with pelvic floor and exhale Double clams black loop x10 each side Standing mario punches x5 each body weight Pt reminded of pelvic floor coordination with breathing with exercises for her HEP and with trainer as able.  Also discussed self monitoring diet triggers for bowel symptoms and discussing with doctor or nutritionist.   08/08/24: All exercises cued for exhale and pelvic floor activations with activity  Sit to stands 10# total x10 with pelvic floor contractions and exhale Standing alt marching with OHP 5# 2x10 Standing lateral step out 5# front press 2x10 each Standing palloff blue band 2x10 each Standing rotational palloffs blue  band 2x10 each Pt educated on pelvic mobility and balloon breathing as needed while sitting on toilet to assist in fully evacuating bowels   09/05/24: Reviewed all HEP and recommendations, water and diet habits for bowel health. Pt very aware of these and working on them. More in depth review of posterior pelvic floor with anatomy picture as pt had questions about pelvic floor mechanics. All questions answered. Pt still fearful of bowel incontinence, though improving and would like to have a hold until after holidays to make sure she is continuing progress.   PATIENT EDUCATION:  Education details: [id number] Person educated: Patient Education method: Explanation, Demonstration, Tactile cues, Verbal cues, and Handouts Education comprehension: verbalized understanding, returned demonstration, verbal cues required, tactile cues required, and needs further education  HOME EXERCISE PROGRAM: [id number]  ASSESSMENT:  CLINICAL IMPRESSION: Patient is a 73 y.o. female  who was seen today for physical therapy treatment for fecal and urinary incontinence. Pt is progressing toward all goals, tolerating all HEP and recommendations well. Denied additional questions at end of session. Pt reports she is feeling better and seeing improvement, hasn't had and fecal  incontinence and aware of stool present in bowel and can go empty now. But fearful of fecal leakage happening and would like to make sure she maintains improvement and would like to be on hold to self monitor progress then agreeable to DC as long as symptoms remain improved.   OBJECTIVE IMPAIRMENTS: decreased activity tolerance, decreased coordination, decreased endurance, decreased mobility, decreased strength, impaired flexibility, improper body mechanics, postural dysfunction, and pain.   ACTIVITY LIMITATIONS: continence  PARTICIPATION LIMITATIONS: community activity  PERSONAL FACTORS: Time since onset of injury/illness/exacerbation are also affecting patient's functional outcome.   REHAB POTENTIAL: Good  CLINICAL DECISION MAKING: Stable/uncomplicated  EVALUATION COMPLEXITY: Low   GOALS: Goals reviewed with patient? Yes  SHORT TERM GOALS: Target date: 07/04/24  Pt to be I with HEP for carry over and continuing recommendations for improved outcomes.   Baseline: Goal status: MET  2.  Pt will be independent with use of squatty potty, relaxed toileting mechanics, and improved bowel movement techniques in order to increase ease of bowel movements and complete evacuation.   Baseline:  Goal status: MET  3.  Pt will be independent with the knack, urge suppression technique, and double voiding in order to improve bladder habits and decrease urinary incontinence.   Baseline:  Goal status: MET  LONG TERM GOALS: Target date: 09/06/24  Pt to be I with  advanced HEP for carry over and continuing recommendations for improved outcomes.   Baseline:  Goal status: MET 09/05/24  2.  Pt will report her BMs are complete due to improved bowel habits and evacuation techniques at least 75% of the time for improved bowel health and more confidence with community outings.  Baseline:  Goal status: MET 09/05/24  3.  Pt to demonstrate improved coordination of pelvic floor and breathing mechanics with  10# squat with appropriate synergistic patterns to decrease pain and leakage at least 75% of the time for improved ability to complete a 30 minute walk without strain at pelvic floor and symptoms.    Baseline:  Goal status: MET 11/20 per pt record of her workouts with trainer   4.  Pt to report no more than 1 instance of fecal leakage in a month for improved confidence with community outings.  Baseline:  Goal status: MET 11/20  5.  Pt to be I with pressure management techniques to decreased strain at pelvic  floor with lifting at least 15# for lifting her dog without pain or leakage. Baseline:  Goal status: MET 11/20 per pt record of her workouts with trainer     PLAN:  PT FREQUENCY: 1x/week  PT DURATION: 8 sessions  PLANNED INTERVENTIONS: 97110-Therapeutic exercises, 97530- Therapeutic activity, 97112- Neuromuscular re-education, 97535- Self Care, [id number]- Manual therapy, [phone]- Canalith repositioning, [id number]- Aquatic Therapy, [phone]- Electrical stimulation (manual), [phone] (1-2 muscles), [id number] (3+ muscles)- Dry Needling, Patient/Family education, Taping, Joint mobilization, Spinal mobilization, Scar mobilization, DME instructions, Cryotherapy, Moist heat, and Biofeedback  PLAN FOR NEXT SESSION: coordination of pelvic floor and breathing, hip and core strengthening, lifting and voiding mechanics   On hold until after holidays to insure progress and reassess at that time.   Darryle Navy, PT, DPT 09/05/2510:09 AM  [facility] [address], [address] [place], [place] [address] Phone # [phone] Fax [phone]

## 2024-09-16 NOTE — Progress Notes (Unsigned)
 Cardiology Office Note:    Date:  09/23/2024   ID:  [name], DOB 1950-10-27, MRN [id number]  PCP:  Royden Ronal Czar, FNP  Cardiologist:  Lurena MARLA Red, MD     Referring MD: Royden Ronal Czar, FNP   Chief Complaint: follow-up of non-ischemic cardiomyopathy and palpitations   History of Present Illness:    [name] is a 73 y.o. female with a history of normal coronaries on coronary CTA in 02/2022, non-ischemic cardiomyopathy/ chronic HFrEF with EF as low as 35-40% in 2023 but normalized to 60-65% on last Echo in 11/2023, AVNRT s/p ablation in 2015 with recurrent palpitations and short runs of SVT, NSVT, and PACs/ PVCs noted on repeat monitors, mitral valve prolapse, hyperlipidemia, hypothyroidism, GERD, chronic fatigue, and insomnia who is followed by Dr. Red  and presents today for routine follow-up.   Patient a history of of AVNRT s/p ablation in 11/2013 with Dr. Waddell. She was previously seen by Dr. Ladona in 11/2018 for atypical chest pain. Coronary calcium  score in 10/2018 was 0 so no additional work-up was felt to be necessary. Echo in 01/2022 for further evaluation of worsening dyspnea on exertion and fatigue showed mildly reduced LVEF of 45-50% with grade 1 diastolic dysfunction, mild MR, and mid TR. She was subsequently admitted in 01/2022 for hyponatremia and acute CHF after presenting with generalized weakness. She was diuresed with IV Lasix  with improvement in sodium level. She was discharged on PO Lasix  and was ultimately started on Metolazone  as well.  Outpatient coronary CTA showed a coronary calcium  score of 0 with no evidence of CAD as well as a small pericardial effusion and small bilateral pleural effusion. Monitor in 02/2022 for further evaluation of palpitations showed 5 short runs of non-sustained VT (longest run 12 beats) and 11 short runs of SVT (longest run 14.5 seconds) as well as occasional PVCs (2.2% burden) and rare PACs. Triggered events corresponded  with sinus rhythm, NSVT, and PACs/ PVCs. She underwent RHC in 03/2022 due to persistent dyspnea which showed normal cardiac output/ index with normal RA pressure and PA pressure but elevated wedge pressure of 24 mmHg with V waves to 38 mmHg. Repeat Echo later that month showed LVEF of 35-40% with global hypokinesis and grade 3 diastolic function. She was started on GDMT but this was limited by low BP. Cardiac MRI in 06/2022 showed LVEF of 42% with small pericardial effusion but no evidence of infiltrative cardiomyopathy. EF remained 35-40% on Echo in 06/2022. She had another monitor in 07/2022 again for further evaluation of palpitations which showed 3 short runs of NSVT (longest run 5 beats) , one 4 beat run of SVT, rare PACs/ PVCs. 24 Hour BP monitor in 05/2023 due to problems with hypotension showed normal BP readings. Most recent Echo in 11/2023 showed normalized of LVEF  to 60-65% with normal wall motion and grade 1 diastolic dysfunction, normal RV function, and only trivial MR. She started Pulmonary Rehab in 12/2023.   She was last seen by me in 01/2024 at which time she continued to report significant fatigue and continued insomnia. She continued to have  decreased stamina and dyspnea on exertion as well but had noticed improvement with Pulmonary Rehab. Her biggest concern was fatigue but she was overall stable form a cardiac standpoint. No additional cardiac work-up was felt to be necessary.  Patient presents today for follow-up.  Patient's main concern is continued decreased stamina/exercise intolerance that she feels like has gotten worse since last  visit.  She still working out doing weight training twice a week for 30-60 minutes at a time and does okay with this but she has significant fatigue if she is walking.  She states that she goes shopping with her friends, she can only make it to about 2 stores.  She denies any chest pain or significant shortness of breath.  No orthopnea, PND, edema.  She states her  chronic palpitations are minimal and denies any lightheadedness, dizziness, or syncope.  She stopped taking metoprolol  about 1 to 2 months ago (mainly due to diarrhea) but reports no improvement in her stamina.  He also stopped taking Nexlizet  a couple weeks ago to see if this would help her stamina but has not noticed any improvement. She does not sleep well at night and struggles with insomnia but this is not new. She states she has had multiple sleep studies over the years.  EKGs/Labs/Other Studies Reviewed:    The following studies were reviewed:  Coronary CTA 03/08/2022: No evidence of CAD, CADRADS = 0. 2. Coronary calcium  score of 0. This was 0 percentile for age and sex matched control. 3. Normal coronary origin with right dominance. 4.  Small pericardial effusion.  Small bilateral pleural effusions. _______________   Right Cardiac Catheterization 04/07/2022: 1.  Cardiac output of 4.1 L/min and index of 2.6 L/min/m. 2.  Mean RA pressure of 1 mmHg, mean PA pressure of 20 mmHg, mean wedge pressure of 24 mmHg with V waves to 38 mmHg, and normal PA pulsatility index.   Recommendation: Given these findings and echocardiogram will be performed to evaluate for significant mitral regurgitation given elevated V waves. _______________   Cardiac MRI 06/29/2022: Impression: - LVEF 42%. - Small pericardial effusion. - No evidence of infiltrative cardiomyopathy. _______________   Monitor 07/2022: Patient had a min HR of 50 bpm, max HR of 179 bpm, and avg HR of 64 bpm. Predominant underlying rhythm was Sinus Rhythm.    Events: - 3 Ventricular Tachycardia runs occurred, the run with the fastest interval lasting 5 beats with a max rate of 179 bpm, the longest lasting 5 beats with an avg rate of 155 bpm. Some episodes of Ventricular Tachycardia may be Supraventricular Tachycardia with possible aberrancy.  - 1 run of Supraventricular Tachycardia occurred lasting 4 beats with a max rate of 107 bpm  (avg 104 bpm).   - Idioventricular Rhythm was present. Idioventricular Rhythm was detected within +/- 45 seconds of symptomatic patient event(s).  - Isolated SVEs were rare (<1.0%), SVE Couplets were rare (<1.0%), and SVE Triplets were rare (<1.0%).  - Isolated VEs were rare (<1.0%, 1739), VE Couplets were rare (<1.0%, 15), and VE Triplets were rare (<1.0%, 1).   No atrial fibrillation, sustained ventricular tachyarrhythmias, or bradyarrhythmias were detected.   Patient triggered events corresponded with sinus rhythm, PVCs, and PACs. _______________   24 Hour Blood Pressure Monitor 05/2023: 1.  Overall blood pressure of 107/63 mmHg with a mean heart rate of 77 bpm. 2.  Awake blood pressure of 109/66 mmHg with a mean heart rate of 79 bpm. 3.  Nocturnal blood pressure of 101/56 mmHg with a mean heart rate of 78 bpm.   Summary: Normal blood pressure readings. _______________   Echocardiogram 12/14/2023: Impressions: 1. Left ventricular ejection fraction, by estimation, is 60 to 65%. The  left ventricle has normal function. The left ventricle has no regional  wall motion abnormalities. Left ventricular diastolic parameters are  consistent with Grade I diastolic  dysfunction (impaired relaxation).  2. Right ventricular systolic function is normal. The right ventricular  size is normal. There is normal pulmonary artery systolic pressure.   3. Left atrial size was moderately dilated.   4. There is no significant mitral valve prolapse. The mitral valve is  normal in structure. Trivial mitral valve regurgitation. No evidence of  mitral stenosis.   5. The aortic valve is tricuspid. Aortic valve regurgitation is not  visualized. No aortic stenosis is present.   6. The inferior vena cava is normal in size with greater than 50%  respiratory variability, suggesting right atrial pressure of 3 mmHg.   EKG:  EKG not ordered today.   Recent Labs: 01/19/2024: BUN 27; Creatinine, Ser 0.94; Potassium  4.4; Sodium 141  Recent Lipid Panel    Component Value Date/Time   CHOL 144 01/19/2024 1533   TRIG 132 01/19/2024 1533   HDL 50 01/19/2024 1533   CHOLHDL 2.9 01/19/2024 1533   CHOLHDL 5 01/18/2012 0910   VLDL 25.4 01/18/2012 0910   LDLCALC 71 01/19/2024 1533   LDLDIRECT 209.4 01/18/2012 0910    Physical Exam:    Vital Signs: BP 102/78   Pulse 93   Ht 5' 4.75 (1.645 m)   Wt 161 lb 3.2 oz (73.1 kg)   SpO2 97%   BMI 27.03 kg/m     Wt Readings from Last 3 Encounters:  09/23/24 161 lb 3.2 oz (73.1 kg)  04/01/24 158 lb (71.7 kg)  03/12/24 157 lb 6.5 oz (71.4 kg)     General: 73 y.o. Caucasian female in no acute distress. HEENT: Normocephalic and atraumatic. Sclera clear.  Neck: Supple. No carotid bruits. No JVD. Heart: RRR. Distinct S1 and S2. No murmurs, gallops, or rubs.  Lungs: No increased work of breathing. Clear to ausculation bilaterally. No wheezes, rhonchi, or rales.  Extremities: No lower extremity edema.  Skin: Warm and dry. Neuro: No focal deficits. Psych: Normal affect. Responds appropriately.  Assessment:    1. Non-ischemic cardiomyopathy (HCC)   2. Chronic combined systolic and diastolic heart failure (HCC)   3. Paroxysmal SVT (supraventricular tachycardia)   4. NSVT (nonsustained ventricular tachycardia) (HCC)   5. Mitral valve insufficiency, unspecified etiology   6. Hyperlipidemia, unspecified hyperlipidemia type   7. Chronic fatigue     Plan:    Non-Ischemic Cardiomyopathy Chronic Combined CHF Patient has a history of non-ischemic cardiomyopathy. Echo in 03/2022 showed LVEF of 35-40% and grade 3 diastolic dysfunction. RHC around this time showed normal cardiac output/ index with normal RA pressure and PA pressure but elevated wedge pressure of 24 mmHg with V waves to 38 mmHg. Cardiac MRI in 06/2022 showed LVEF of 42% with small pericardial effusion but no evidence of infiltrative cardiomyopathy. EF has subsequently improved. Last Echo in 11/2023  showed LVEF of 60-65% with normal wall motion and grade 1 diastolic dysfunction, normal RV function, and only trivial MR.  - Euvolemic on exam. No signs or symptoms of CHF. - GDMT has been limited by hypotension. Only on Toprol -XL 12.5mg  daily. See notes regarding this below.   Paroxysmal SVT Non-Sustained VT Patient has a history of AVNRT s/p ablation in 2015. She has had recurrent palpitations over the last couple of years. Repeat monitor in 02/2022 and 07/2022 showed short runs of paroxysmal SVT and non-sustained VT but no significant arrhythmias/ sustained arrhythmias.  - Stable. She reports only minimal palpitations.  - Previously on Toprol -XL 12.5mg  daily; however, she stopped taking this 1 to 2 months ago on her own to see  if it would help with her decreased stamina and diarrhea but had no significant improvement with this.  Will have her continue to hold this until ETT has been completed for chronotropic incompetence.   Mitral Regurgitation Prior Echo in 06/2022 showed mild to moderate MR. However, most recent Echo in 11/2023 showed only trivial MR.   Hyperlipidemia Recent lipid panel on 01/2024: Total Cholesterol  144, Triglycerides 132, HDL 50, LDL 71. LDL significant improved from >200 in 10/2023 after starting Nexlizet . Coronary calcium  score 0 in 2023 but she does have a strong family history of CAD.  - She has been unable to tolerate statins in the past due to myalgias.  - Previously on Nexlizet  180-10mg  every other day.  She was taking every other day due to diarrhea.  However, she stopped taking this a couple weeks ago to see if this would help with her decreased stamina.  She has not noticed any improvement.  I do not think her decreased stamina is due to to the Nexlizet  but she would like to hold this for 6 months to see how she does.  We can discuss restarting at follow-up visit.  Chronic Fatigue Exercise Intolerance Patient has a history of significant fatigue and decreased  stamina/ exercise tolerance which is a chronic issues and has been going on for years.  She had extensive lab work done at [facility] office last week - Vitamin D level was mildly low but otherwise labs (including TSH) were unremarkable. Labs can be seen under the [facility] DXA tab. - Patient continues to have significant decreased stamina which she feels like has worsened since her last visit. -She has had a thorough cardiac workup recently.  Coronary CTA 02/2022 showed a coronary calcium  score of 0 with no evidence of CAD, so I think it is unlikely that she went from no disease to significant disease causing exercise intolerance and less than 3 years.  Recent Echo in 11/2023 showed normalization of EF.  She has not been assessed for chronotropic incompetence which could cause the symptoms.  Therefore, we will order it exercise tolerance test to assess for this as patient desperately wants an answer to the cause of her symptoms.  Shared Decision Making/Informed Consent The risks [chest pain, shortness of breath, cardiac arrhythmias, dizziness, blood pressure fluctuations, myocardial infarction, stroke/transient ischemic attack, and life-threatening complications (estimated to be 1 in 10,000)], benefits (risk stratification, diagnosing coronary artery disease, treatment guidance) and alternatives of an exercise tolerance test were discussed in detail with [name] and she agrees to proceed.     Disposition: Follow up in 6 months.   Signed, Laurens Matheny E Su Duma, PA-C  09/23/2024 8:26 AM    [facility]  ADDENDUM: Spoke with Dr. Wendel who recommended a cardiopulmonary exercise test rather than an ETT. Called and spoke with patient who agreed with this plan. Will cancel the ETT and order a CPX.  [name] E Ande Therrell, PA-C 09/23/2024 5:13 PM

## 2024-09-19 DIAGNOSIS — D508 Other iron deficiency anemias: Secondary | ICD-10-CM | POA: Diagnosis not present

## 2024-09-19 DIAGNOSIS — E559 Vitamin D deficiency, unspecified: Secondary | ICD-10-CM | POA: Diagnosis not present

## 2024-09-19 DIAGNOSIS — E039 Hypothyroidism, unspecified: Secondary | ICD-10-CM | POA: Diagnosis not present

## 2024-09-19 DIAGNOSIS — I428 Other cardiomyopathies: Secondary | ICD-10-CM | POA: Diagnosis not present

## 2024-09-19 DIAGNOSIS — R5383 Other fatigue: Secondary | ICD-10-CM | POA: Diagnosis not present

## 2024-09-19 DIAGNOSIS — I10 Essential (primary) hypertension: Secondary | ICD-10-CM | POA: Diagnosis not present

## 2024-09-23 ENCOUNTER — Ambulatory Visit: Attending: Student | Admitting: Student

## 2024-09-23 ENCOUNTER — Telehealth: Payer: Self-pay

## 2024-09-23 ENCOUNTER — Telehealth: Payer: Self-pay | Admitting: Internal Medicine

## 2024-09-23 ENCOUNTER — Encounter: Payer: Self-pay | Admitting: Student

## 2024-09-23 VITALS — BP 102/78 | HR 93 | Ht 64.75 in | Wt 161.2 lb

## 2024-09-23 DIAGNOSIS — R6889 Other general symptoms and signs: Secondary | ICD-10-CM

## 2024-09-23 DIAGNOSIS — E785 Hyperlipidemia, unspecified: Secondary | ICD-10-CM

## 2024-09-23 DIAGNOSIS — I5042 Chronic combined systolic (congestive) and diastolic (congestive) heart failure: Secondary | ICD-10-CM

## 2024-09-23 DIAGNOSIS — I4729 Other ventricular tachycardia: Secondary | ICD-10-CM | POA: Diagnosis present

## 2024-09-23 DIAGNOSIS — I428 Other cardiomyopathies: Secondary | ICD-10-CM | POA: Diagnosis present

## 2024-09-23 DIAGNOSIS — I471 Supraventricular tachycardia, unspecified: Secondary | ICD-10-CM | POA: Diagnosis present

## 2024-09-23 DIAGNOSIS — I340 Nonrheumatic mitral (valve) insufficiency: Secondary | ICD-10-CM | POA: Diagnosis present

## 2024-09-23 DIAGNOSIS — R5382 Chronic fatigue, unspecified: Secondary | ICD-10-CM | POA: Diagnosis present

## 2024-09-23 NOTE — Addendum Note (Signed)
 Addended by: Aariah Godette on: 09/23/2024 05:15 PM   Modules accepted: Orders

## 2024-09-23 NOTE — Telephone Encounter (Signed)
 Patient stated C. Jadine, PA wants her to change test and wants a call back regarding next steps.

## 2024-09-23 NOTE — Patient Instructions (Addendum)
 Thank you for choosing [facility]!     Medication Instructions:  Continue to hold the Metoprolol  *If you need a refill on your cardiac medications before your next appointment, please call your pharmacy*   Lab Work: No labs were ordered during today's visit.  If you have labs (blood work) drawn today and your tests are completely normal, you will receive your results only by: MyChart Message (if you have MyChart) OR A paper copy in the mail If you have any lab test that is abnormal or we need to change your treatment, we will call you to review the results.   Testing/Procedures: Your physician has recommended that you have a cardiopulmonary stress test (CPX). CPX testing is a non-invasive measurement of heart and lung function. It replaces a traditional treadmill stress test. This type of test provides a tremendous amount of information that relates not only to your present condition but also for future outcomes. This test combines measurements of you ventilation, respiratory gas exchange in the lungs, electrocardiogram (EKG), blood pressure and physical response before, during, and following an exercise protocol.  CPX TEST PATIENT INSTRUCTIONS    Expect to be in the lab for 2 hours. Please plan to arrive 30 MINUTES PRIOR to your appointment. You may be asked to reschedule your test if you arrive 20 minutes or more after your scheduled appointment time.  Main Campus address: [address] [place], [place] [address]  *You may arrive to Main Entrance A or Entrance C (Free valet parking is available at both).  - Main Entrance A (from [facility]): proceed to admitting for check-in  - Entrance C (from [facility]): proceed to [employer] parking or under hospital deck parking using this code _______________  Check-in: [facility] waiting room   General instructions for the day of the test (Please follow all instructions from your physician):   Refrain from  ingesting a heavy meal, alcohol, or caffeine or using tobacco products within 2 hours of the test (DO NOT FAST for more than 8 hours). You may have all other non-alcoholic, non-caffeinated beverages a light snack (crackers, a piece of fruit, carrot sticks, toast, bagel, etc.) up to your appointment time.   Avoid significant exertion or exercise within 24 hours of your test.   Be prepared to exercise and sweat. Your clothing should permit freedom of movement and include walking or running shoes. Women bring loose-fitting, short-sleeved blouse.   This evaluation may be fatiguing, and you may wish to have someone accompany you to the assessment to drive you home afterward.   Bring a list of your medications with you, including dosage and frequency you take the medication (i.e., once per day, twice per day, etc).   Take all medications as prescribed, unless noted below or instructed to do so by your physician.   Please do not take the following medications prior to your CPX:  Brief description of the test:  A brief lung function test will be performed. This will involve you taking deep breaths and blowing out hard and fast through your mouth. During these, a clip will be on your nose and you will be breathing through a breathing device.  For the exercise portion of the test you will be walking on a treadmill, or riding a stationary bike, to your maximal effort or until symptoms such as chest pain, shortness of breath, leg pain or dizziness limit your exercise. You will be breathing in and out of a breathing  device through your mouth (a clip will be on your nose again). Your heart rate, ECG, blood pressure, oxygen saturation, breathing rate and depth, amount of oxygen you consume and amount of carbon dioxide you produce will be measured and monitored throughout the exercise test.  If you need to cancel or reschedule your appointment please call: [phone]    Your next appointment:   6  month(s)   Provider:   Arun K Thukkani, MD or Aline Door     Follow-Up: At [facility], you and your health needs are our priority.  As part of our continuing mission to provide you with exceptional heart care, we have created designated Provider Care Teams.  These Care Teams include your primary Cardiologist (physician) and Advanced Practice Providers (APPs -  Physician Assistants and Nurse Practitioners) who all work together to provide you with the care you need, when you need it. We recommend signing up for the patient portal called MyChart.  Sign up information is provided on this After Visit Summary.  MyChart is used to connect with patients for Virtual Visits (Telemedicine).  Patients are able to view lab/test results, encounter notes, upcoming appointments, etc.  Non-urgent messages can be sent to your provider as well.   To learn more about what you can do with MyChart, go to [email or web address].

## 2024-09-23 NOTE — Telephone Encounter (Signed)
 Spoke with pt, she is returning [name]'s call. Will forward to her

## 2024-09-24 ENCOUNTER — Telehealth: Payer: Self-pay

## 2024-09-24 NOTE — Telephone Encounter (Signed)
 Spoke with patient. She is aware that her appt for cpx is scheduled for 12/15 25 at 11am. Parking code given. Patient information will be mailed. Patient verbalized understanding.

## 2024-09-24 NOTE — Telephone Encounter (Signed)
 Returned patient's call yesterday evening. Discussed plan to get CPX rather than ETT. Patient agreeable to this. Please see office visit note from 09/23/2024 for more information.  Sigrid Schwebach E Adonai Selsor, PA-C 09/24/2024 7:12 AM

## 2024-09-25 DIAGNOSIS — I428 Other cardiomyopathies: Secondary | ICD-10-CM | POA: Diagnosis not present

## 2024-09-25 DIAGNOSIS — E039 Hypothyroidism, unspecified: Secondary | ICD-10-CM | POA: Diagnosis not present

## 2024-09-25 DIAGNOSIS — E559 Vitamin D deficiency, unspecified: Secondary | ICD-10-CM | POA: Diagnosis not present

## 2024-09-25 DIAGNOSIS — R5383 Other fatigue: Secondary | ICD-10-CM | POA: Diagnosis not present

## 2024-09-26 DIAGNOSIS — E559 Vitamin D deficiency, unspecified: Secondary | ICD-10-CM | POA: Diagnosis not present

## 2024-09-26 DIAGNOSIS — R5383 Other fatigue: Secondary | ICD-10-CM | POA: Diagnosis not present

## 2024-09-26 DIAGNOSIS — E039 Hypothyroidism, unspecified: Secondary | ICD-10-CM | POA: Diagnosis not present

## 2024-09-26 DIAGNOSIS — R7309 Other abnormal glucose: Secondary | ICD-10-CM | POA: Diagnosis not present

## 2024-09-30 ENCOUNTER — Ambulatory Visit (HOSPITAL_COMMUNITY): Attending: Registered Nurse

## 2024-09-30 DIAGNOSIS — R6889 Other general symptoms and signs: Secondary | ICD-10-CM

## 2024-09-30 DIAGNOSIS — E039 Hypothyroidism, unspecified: Secondary | ICD-10-CM | POA: Diagnosis not present

## 2024-09-30 DIAGNOSIS — D508 Other iron deficiency anemias: Secondary | ICD-10-CM | POA: Diagnosis not present

## 2024-09-30 DIAGNOSIS — R0789 Other chest pain: Secondary | ICD-10-CM | POA: Diagnosis not present

## 2024-09-30 DIAGNOSIS — R5382 Chronic fatigue, unspecified: Secondary | ICD-10-CM

## 2024-09-30 DIAGNOSIS — E871 Hypo-osmolality and hyponatremia: Secondary | ICD-10-CM | POA: Diagnosis not present

## 2024-09-30 DIAGNOSIS — R0609 Other forms of dyspnea: Secondary | ICD-10-CM | POA: Diagnosis not present

## 2024-09-30 DIAGNOSIS — R269 Unspecified abnormalities of gait and mobility: Secondary | ICD-10-CM | POA: Diagnosis not present

## 2024-09-30 DIAGNOSIS — I5022 Chronic systolic (congestive) heart failure: Secondary | ICD-10-CM | POA: Diagnosis not present

## 2024-10-01 ENCOUNTER — Encounter (HOSPITAL_COMMUNITY)

## 2024-10-04 DIAGNOSIS — I5022 Chronic systolic (congestive) heart failure: Secondary | ICD-10-CM | POA: Diagnosis not present

## 2024-10-04 DIAGNOSIS — R5382 Chronic fatigue, unspecified: Secondary | ICD-10-CM | POA: Diagnosis not present

## 2024-10-07 NOTE — Telephone Encounter (Signed)
 Closing open encounter note.

## 2024-10-14 ENCOUNTER — Telehealth: Payer: Self-pay | Admitting: Student

## 2024-10-14 NOTE — Telephone Encounter (Signed)
" ° °  Patient would like to follow up her results and also she would like for Aline Door send her a mychart message about her result. She said no need to call her back and just send her a mychart message "

## 2024-10-16 NOTE — Telephone Encounter (Signed)
 We are still waiting on the final read from MD. Sent patient a MyChart message explaining this.  Thanks! Kharlie Bring

## 2024-10-21 ENCOUNTER — Ambulatory Visit: Payer: Self-pay | Admitting: Student

## 2024-10-25 NOTE — Telephone Encounter (Signed)
 Hi Ms. Tarry,  You have had a thorough cardiac work-up over the last couple of years and everything has been reassuring, so I don't think there is any other cardiac work-up that we need to do right now. I would leave any decision about a ANA, ds DNA, or cardiolipin antibodies to your primary care provider. We don't have any specific protocols for estrogen therapy. We typically leave that to the primary care provider as well.  Best, Cyenna Rebello

## 2024-10-28 NOTE — Telephone Encounter (Signed)
 Results viewed via mychart 10/22/24

## 2024-10-30 ENCOUNTER — Ambulatory Visit: Payer: Self-pay | Admitting: Physical Therapy
